# Patient Record
Sex: Male | Born: 1935 | Race: White | Hispanic: No | Marital: Married | Smoking: Never smoker
Health system: Southern US, Community
[De-identification: ages and names within clinical notes are randomized; demographics above are authoritative.]

## PROBLEM LIST (undated history)

## (undated) DIAGNOSIS — F329 Major depressive disorder, single episode, unspecified: Secondary | ICD-10-CM

## (undated) DIAGNOSIS — E1149 Type 2 diabetes mellitus with other diabetic neurological complication: Secondary | ICD-10-CM

## (undated) DIAGNOSIS — R1310 Dysphagia, unspecified: Secondary | ICD-10-CM

## (undated) DIAGNOSIS — G8929 Other chronic pain: Secondary | ICD-10-CM

## (undated) DIAGNOSIS — J449 Chronic obstructive pulmonary disease, unspecified: Secondary | ICD-10-CM

## (undated) DIAGNOSIS — I82409 Acute embolism and thrombosis of unspecified deep veins of unspecified lower extremity: Secondary | ICD-10-CM

## (undated) DIAGNOSIS — E039 Hypothyroidism, unspecified: Secondary | ICD-10-CM

## (undated) DIAGNOSIS — J4489 Other specified chronic obstructive pulmonary disease: Secondary | ICD-10-CM

## (undated) DIAGNOSIS — M549 Dorsalgia, unspecified: Secondary | ICD-10-CM

## (undated) DIAGNOSIS — F0390 Unspecified dementia without behavioral disturbance: Secondary | ICD-10-CM

## (undated) DIAGNOSIS — I509 Heart failure, unspecified: Secondary | ICD-10-CM

## (undated) DIAGNOSIS — N289 Disorder of kidney and ureter, unspecified: Secondary | ICD-10-CM

## (undated) DIAGNOSIS — Z950 Presence of cardiac pacemaker: Secondary | ICD-10-CM

## (undated) DIAGNOSIS — K5900 Constipation, unspecified: Secondary | ICD-10-CM

## (undated) DIAGNOSIS — C189 Malignant neoplasm of colon, unspecified: Secondary | ICD-10-CM

## (undated) DIAGNOSIS — R011 Cardiac murmur, unspecified: Secondary | ICD-10-CM

## (undated) DIAGNOSIS — I482 Chronic atrial fibrillation, unspecified: Secondary | ICD-10-CM

## (undated) DIAGNOSIS — R0609 Other forms of dyspnea: Secondary | ICD-10-CM

## (undated) DIAGNOSIS — C44211 Basal cell carcinoma of skin of unspecified ear and external auricular canal: Secondary | ICD-10-CM

## (undated) DIAGNOSIS — M1990 Unspecified osteoarthritis, unspecified site: Secondary | ICD-10-CM

## (undated) DIAGNOSIS — N390 Urinary tract infection, site not specified: Secondary | ICD-10-CM

## (undated) DIAGNOSIS — R21 Rash and other nonspecific skin eruption: Secondary | ICD-10-CM

## (undated) DIAGNOSIS — I809 Phlebitis and thrombophlebitis of unspecified site: Secondary | ICD-10-CM

## (undated) DIAGNOSIS — I10 Essential (primary) hypertension: Secondary | ICD-10-CM

## (undated) DIAGNOSIS — I639 Cerebral infarction, unspecified: Secondary | ICD-10-CM

## (undated) DIAGNOSIS — G609 Hereditary and idiopathic neuropathy, unspecified: Secondary | ICD-10-CM

## (undated) DIAGNOSIS — R0600 Dyspnea, unspecified: Secondary | ICD-10-CM

## (undated) DIAGNOSIS — H9190 Unspecified hearing loss, unspecified ear: Secondary | ICD-10-CM

## (undated) DIAGNOSIS — Z8719 Personal history of other diseases of the digestive system: Secondary | ICD-10-CM

## (undated) DIAGNOSIS — F32A Depression, unspecified: Secondary | ICD-10-CM

## (undated) DIAGNOSIS — J189 Pneumonia, unspecified organism: Secondary | ICD-10-CM

## (undated) HISTORY — DX: Type 2 diabetes mellitus with other diabetic neurological complication: E11.49

## (undated) HISTORY — PX: INSERT / REPLACE / REMOVE PACEMAKER: SUR710

## (undated) HISTORY — DX: Constipation, unspecified: K59.00

## (undated) HISTORY — PX: COLECTOMY: SHX59

## (undated) HISTORY — PX: CORONARY ARTERY BYPASS GRAFT: SHX141

## (undated) HISTORY — DX: Other specified chronic obstructive pulmonary disease: J44.89

## (undated) HISTORY — PX: HERNIA REPAIR: SHX51

## (undated) HISTORY — PX: CATARACT EXTRACTION W/ INTRAOCULAR LENS  IMPLANT, BILATERAL: SHX1307

## (undated) HISTORY — PX: BASAL CELL CARCINOMA EXCISION: SHX1214

## (undated) HISTORY — DX: Hypothyroidism, unspecified: E03.9

## (undated) HISTORY — PX: TONSILLECTOMY: SUR1361

## (undated) HISTORY — DX: Hereditary and idiopathic neuropathy, unspecified: G60.9

## (undated) HISTORY — DX: Chronic obstructive pulmonary disease, unspecified: J44.9

## (undated) HISTORY — DX: Rash and other nonspecific skin eruption: R21

---

## 1999-04-17 ENCOUNTER — Encounter: Payer: Self-pay | Admitting: Internal Medicine

## 1999-04-17 ENCOUNTER — Inpatient Hospital Stay (HOSPITAL_COMMUNITY): Admission: EM | Admit: 1999-04-17 | Discharge: 1999-04-21 | Payer: Self-pay | Admitting: Emergency Medicine

## 1999-04-19 ENCOUNTER — Encounter: Payer: Self-pay | Admitting: Pediatrics

## 1999-04-21 ENCOUNTER — Inpatient Hospital Stay (HOSPITAL_COMMUNITY)
Admission: RE | Admit: 1999-04-21 | Discharge: 1999-05-12 | Payer: Self-pay | Admitting: Physical Medicine and Rehabilitation

## 1999-04-23 ENCOUNTER — Encounter: Payer: Self-pay | Admitting: Physical Medicine and Rehabilitation

## 1999-04-28 ENCOUNTER — Encounter: Payer: Self-pay | Admitting: Physical Medicine and Rehabilitation

## 1999-05-10 ENCOUNTER — Encounter: Payer: Self-pay | Admitting: Physical Medicine and Rehabilitation

## 1999-05-24 ENCOUNTER — Ambulatory Visit (HOSPITAL_COMMUNITY)
Admission: RE | Admit: 1999-05-24 | Discharge: 1999-05-24 | Payer: Self-pay | Admitting: Physical Medicine and Rehabilitation

## 1999-05-24 ENCOUNTER — Encounter: Payer: Self-pay | Admitting: Physical Medicine and Rehabilitation

## 1999-07-06 ENCOUNTER — Ambulatory Visit (HOSPITAL_COMMUNITY): Admission: RE | Admit: 1999-07-06 | Discharge: 1999-07-06 | Payer: Self-pay | Admitting: Endocrinology

## 1999-07-06 ENCOUNTER — Encounter: Payer: Self-pay | Admitting: Endocrinology

## 1999-07-23 ENCOUNTER — Inpatient Hospital Stay (HOSPITAL_COMMUNITY): Admission: EM | Admit: 1999-07-23 | Discharge: 1999-07-28 | Payer: Self-pay | Admitting: Emergency Medicine

## 1999-07-23 ENCOUNTER — Encounter: Payer: Self-pay | Admitting: Internal Medicine

## 1999-09-12 ENCOUNTER — Inpatient Hospital Stay (HOSPITAL_COMMUNITY): Admission: EM | Admit: 1999-09-12 | Discharge: 1999-09-16 | Payer: Self-pay | Admitting: Emergency Medicine

## 1999-10-10 ENCOUNTER — Ambulatory Visit (HOSPITAL_COMMUNITY): Admission: RE | Admit: 1999-10-10 | Discharge: 1999-10-10 | Payer: Self-pay | Admitting: Gastroenterology

## 2002-04-18 ENCOUNTER — Encounter: Admission: RE | Admit: 2002-04-18 | Discharge: 2002-04-18 | Payer: Self-pay | Admitting: Interventional Cardiology

## 2002-04-18 ENCOUNTER — Encounter: Payer: Self-pay | Admitting: Interventional Cardiology

## 2002-04-21 ENCOUNTER — Ambulatory Visit (HOSPITAL_COMMUNITY): Admission: RE | Admit: 2002-04-21 | Discharge: 2002-04-21 | Payer: Self-pay | Admitting: Interventional Cardiology

## 2002-04-21 ENCOUNTER — Encounter: Payer: Self-pay | Admitting: Interventional Cardiology

## 2002-06-16 ENCOUNTER — Encounter: Payer: Self-pay | Admitting: Cardiothoracic Surgery

## 2002-06-18 ENCOUNTER — Encounter (INDEPENDENT_AMBULATORY_CARE_PROVIDER_SITE_OTHER): Payer: Self-pay | Admitting: *Deleted

## 2002-06-18 ENCOUNTER — Encounter: Payer: Self-pay | Admitting: Cardiothoracic Surgery

## 2002-06-18 ENCOUNTER — Inpatient Hospital Stay (HOSPITAL_COMMUNITY): Admission: RE | Admit: 2002-06-18 | Discharge: 2002-06-26 | Payer: Self-pay | Admitting: Cardiothoracic Surgery

## 2002-06-19 ENCOUNTER — Encounter: Payer: Self-pay | Admitting: Cardiothoracic Surgery

## 2002-06-20 ENCOUNTER — Encounter: Payer: Self-pay | Admitting: Cardiothoracic Surgery

## 2002-06-22 ENCOUNTER — Encounter: Payer: Self-pay | Admitting: Cardiothoracic Surgery

## 2002-06-23 ENCOUNTER — Encounter: Payer: Self-pay | Admitting: Cardiothoracic Surgery

## 2002-06-24 ENCOUNTER — Encounter: Payer: Self-pay | Admitting: Cardiothoracic Surgery

## 2003-05-22 ENCOUNTER — Encounter: Payer: Self-pay | Admitting: *Deleted

## 2003-05-22 ENCOUNTER — Emergency Department (HOSPITAL_COMMUNITY): Admission: EM | Admit: 2003-05-22 | Discharge: 2003-05-23 | Payer: Self-pay | Admitting: Emergency Medicine

## 2003-05-29 HISTORY — PX: MITRAL VALVE ANNULOPLASTY: SHX2038

## 2004-04-04 ENCOUNTER — Encounter: Admission: RE | Admit: 2004-04-04 | Discharge: 2004-04-04 | Payer: Self-pay | Admitting: Endocrinology

## 2004-10-19 ENCOUNTER — Ambulatory Visit (HOSPITAL_COMMUNITY): Admission: RE | Admit: 2004-10-19 | Discharge: 2004-10-19 | Payer: Self-pay | Admitting: Gastroenterology

## 2004-10-19 ENCOUNTER — Encounter (INDEPENDENT_AMBULATORY_CARE_PROVIDER_SITE_OTHER): Payer: Self-pay | Admitting: Specialist

## 2005-05-07 ENCOUNTER — Emergency Department (HOSPITAL_COMMUNITY): Admission: EM | Admit: 2005-05-07 | Discharge: 2005-05-07 | Payer: Self-pay | Admitting: Emergency Medicine

## 2006-01-08 ENCOUNTER — Ambulatory Visit (HOSPITAL_COMMUNITY): Admission: RE | Admit: 2006-01-08 | Discharge: 2006-01-09 | Payer: Self-pay | Admitting: Cardiology

## 2007-02-23 ENCOUNTER — Emergency Department (HOSPITAL_COMMUNITY): Admission: EM | Admit: 2007-02-23 | Discharge: 2007-02-23 | Payer: Self-pay | Admitting: Emergency Medicine

## 2007-02-23 ENCOUNTER — Ambulatory Visit: Payer: Self-pay | Admitting: Vascular Surgery

## 2007-03-05 ENCOUNTER — Encounter: Admission: RE | Admit: 2007-03-05 | Discharge: 2007-03-05 | Payer: Self-pay | Admitting: Internal Medicine

## 2007-03-05 ENCOUNTER — Ambulatory Visit: Payer: Self-pay | Admitting: *Deleted

## 2007-03-06 ENCOUNTER — Ambulatory Visit: Payer: Self-pay | Admitting: Vascular Surgery

## 2007-03-14 ENCOUNTER — Ambulatory Visit (HOSPITAL_COMMUNITY): Admission: RE | Admit: 2007-03-14 | Discharge: 2007-03-15 | Payer: Self-pay | Admitting: Orthopedic Surgery

## 2007-03-14 ENCOUNTER — Encounter (INDEPENDENT_AMBULATORY_CARE_PROVIDER_SITE_OTHER): Payer: Self-pay | Admitting: Orthopedic Surgery

## 2007-10-05 ENCOUNTER — Emergency Department (HOSPITAL_COMMUNITY): Admission: EM | Admit: 2007-10-05 | Discharge: 2007-10-05 | Payer: Self-pay | Admitting: Emergency Medicine

## 2008-05-26 ENCOUNTER — Emergency Department (HOSPITAL_COMMUNITY): Admission: EM | Admit: 2008-05-26 | Discharge: 2008-05-26 | Payer: Self-pay | Admitting: Emergency Medicine

## 2008-06-23 ENCOUNTER — Inpatient Hospital Stay (HOSPITAL_COMMUNITY): Admission: EM | Admit: 2008-06-23 | Discharge: 2008-06-26 | Payer: Self-pay | Admitting: Emergency Medicine

## 2008-08-24 ENCOUNTER — Ambulatory Visit (HOSPITAL_COMMUNITY): Admission: RE | Admit: 2008-08-24 | Discharge: 2008-08-24 | Payer: Self-pay | Admitting: *Deleted

## 2008-12-22 ENCOUNTER — Emergency Department (HOSPITAL_COMMUNITY): Admission: EM | Admit: 2008-12-22 | Discharge: 2008-12-22 | Payer: Self-pay | Admitting: Emergency Medicine

## 2009-09-06 ENCOUNTER — Ambulatory Visit: Payer: Self-pay | Admitting: Surgery

## 2009-09-06 ENCOUNTER — Encounter (INDEPENDENT_AMBULATORY_CARE_PROVIDER_SITE_OTHER): Payer: Self-pay | Admitting: Internal Medicine

## 2009-09-06 ENCOUNTER — Inpatient Hospital Stay (HOSPITAL_COMMUNITY): Admission: EM | Admit: 2009-09-06 | Discharge: 2009-09-10 | Payer: Self-pay | Admitting: Emergency Medicine

## 2009-09-07 ENCOUNTER — Encounter (INDEPENDENT_AMBULATORY_CARE_PROVIDER_SITE_OTHER): Payer: Self-pay | Admitting: Internal Medicine

## 2009-11-23 ENCOUNTER — Emergency Department (HOSPITAL_COMMUNITY): Admission: EM | Admit: 2009-11-23 | Discharge: 2009-11-24 | Payer: Self-pay | Admitting: Emergency Medicine

## 2009-12-09 ENCOUNTER — Inpatient Hospital Stay (HOSPITAL_COMMUNITY): Admission: EM | Admit: 2009-12-09 | Discharge: 2009-12-21 | Payer: Self-pay | Admitting: Emergency Medicine

## 2009-12-15 ENCOUNTER — Encounter (INDEPENDENT_AMBULATORY_CARE_PROVIDER_SITE_OTHER): Payer: Self-pay | Admitting: Family Medicine

## 2009-12-15 ENCOUNTER — Ambulatory Visit: Payer: Self-pay | Admitting: Vascular Surgery

## 2009-12-16 ENCOUNTER — Encounter (INDEPENDENT_AMBULATORY_CARE_PROVIDER_SITE_OTHER): Payer: Self-pay | Admitting: Family Medicine

## 2010-05-25 ENCOUNTER — Inpatient Hospital Stay (HOSPITAL_COMMUNITY): Admission: EM | Admit: 2010-05-25 | Discharge: 2010-06-16 | Payer: Self-pay | Admitting: Emergency Medicine

## 2010-06-13 ENCOUNTER — Encounter (INDEPENDENT_AMBULATORY_CARE_PROVIDER_SITE_OTHER): Payer: Self-pay | Admitting: Internal Medicine

## 2010-11-09 LAB — MAGNESIUM
Magnesium: 1.6 mg/dL (ref 1.5–2.5)
Magnesium: 1.7 mg/dL (ref 1.5–2.5)
Magnesium: 1.7 mg/dL (ref 1.5–2.5)
Magnesium: 1.7 mg/dL (ref 1.5–2.5)
Magnesium: 1.7 mg/dL (ref 1.5–2.5)

## 2010-11-09 LAB — BASIC METABOLIC PANEL
BUN: 14 mg/dL (ref 6–23)
BUN: 14 mg/dL (ref 6–23)
CO2: 29 mEq/L (ref 19–32)
Calcium: 8.3 mg/dL — ABNORMAL LOW (ref 8.4–10.5)
Calcium: 8.5 mg/dL (ref 8.4–10.5)
Calcium: 8.5 mg/dL (ref 8.4–10.5)
Calcium: 8.5 mg/dL (ref 8.4–10.5)
Calcium: 8.5 mg/dL (ref 8.4–10.5)
Chloride: 98 mEq/L (ref 96–112)
Creatinine, Ser: 1.24 mg/dL (ref 0.4–1.5)
GFR calc Af Amer: >60 mL/min (ref >60)
GFR calc non Af Amer: 53 mL/min — ABNORMAL LOW (ref >60)
GFR calc non Af Amer: 54 mL/min — ABNORMAL LOW (ref >60)
GFR calc non Af Amer: 54 mL/min — ABNORMAL LOW (ref >60)
GFR calc non Af Amer: 55 mL/min — ABNORMAL LOW (ref >60)
GFR calc non Af Amer: >60 mL/min (ref >60)
Glucose, Bld: 103 mg/dL — ABNORMAL HIGH (ref 70–99)
Glucose, Bld: 121 mg/dL — ABNORMAL HIGH (ref 70–99)
Glucose, Bld: 121 mg/dL — ABNORMAL HIGH (ref 70–99)
Glucose, Bld: 184 mg/dL — ABNORMAL HIGH (ref 70–99)
Glucose, Bld: 93 mg/dL (ref 70–99)
Potassium: 4.1 mEq/L (ref 3.5–5.1)
Potassium: 4.3 mEq/L (ref 3.5–5.1)
Potassium: 4.5 mEq/L (ref 3.5–5.1)
Sodium: 134 mEq/L — ABNORMAL LOW (ref 135–145)
Sodium: 134 mEq/L — ABNORMAL LOW (ref 135–145)
Sodium: 134 mEq/L — ABNORMAL LOW (ref 135–145)
Sodium: 137 mEq/L (ref 135–145)

## 2010-11-09 LAB — GLUCOSE, CAPILLARY
Glucose-Capillary: 100 mg/dL — ABNORMAL HIGH (ref 70–99)
Glucose-Capillary: 111 mg/dL — ABNORMAL HIGH (ref 70–99)
Glucose-Capillary: 111 mg/dL — ABNORMAL HIGH (ref 70–99)
Glucose-Capillary: 113 mg/dL — ABNORMAL HIGH (ref 70–99)
Glucose-Capillary: 114 mg/dL — ABNORMAL HIGH (ref 70–99)
Glucose-Capillary: 115 mg/dL — ABNORMAL HIGH (ref 70–99)
Glucose-Capillary: 118 mg/dL — ABNORMAL HIGH (ref 70–99)
Glucose-Capillary: 122 mg/dL — ABNORMAL HIGH (ref 70–99)
Glucose-Capillary: 127 mg/dL — ABNORMAL HIGH (ref 70–99)
Glucose-Capillary: 132 mg/dL — ABNORMAL HIGH (ref 70–99)
Glucose-Capillary: 137 mg/dL — ABNORMAL HIGH (ref 70–99)
Glucose-Capillary: 139 mg/dL — ABNORMAL HIGH (ref 70–99)
Glucose-Capillary: 150 mg/dL — ABNORMAL HIGH (ref 70–99)
Glucose-Capillary: 94 mg/dL (ref 70–99)
Glucose-Capillary: 96 mg/dL (ref 70–99)

## 2010-11-09 LAB — CBC
HCT: 31.3 % — ABNORMAL LOW (ref 39.0–52.0)
HCT: 31.3 % — ABNORMAL LOW (ref 39.0–52.0)
HCT: 31.9 % — ABNORMAL LOW (ref 39.0–52.0)
HCT: 34.3 % — ABNORMAL LOW (ref 39.0–52.0)
Hemoglobin: 10.4 g/dL — ABNORMAL LOW (ref 13.0–17.0)
Hemoglobin: 10.5 g/dL — ABNORMAL LOW (ref 13.0–17.0)
Hemoglobin: 10.7 g/dL — ABNORMAL LOW (ref 13.0–17.0)
Hemoglobin: 11.2 g/dL — ABNORMAL LOW (ref 13.0–17.0)
MCH: 29.6 pg (ref 26.0–34.0)
MCHC: 33.2 g/dL (ref 30.0–36.0)
MCHC: 33.2 g/dL (ref 30.0–36.0)
MCHC: 33.3 g/dL (ref 30.0–36.0)
MCHC: 33.5 g/dL (ref 30.0–36.0)
MCHC: 33.5 g/dL (ref 30.0–36.0)
MCV: 88.7 fL (ref 78.0–100.0)
Platelets: 226 10*3/uL (ref 150–400)
RBC: 3.53 MIL/uL — ABNORMAL LOW (ref 4.22–5.81)
RDW: 16 % — ABNORMAL HIGH (ref 11.5–15.5)
RDW: 16.1 % — ABNORMAL HIGH (ref 11.5–15.5)
RDW: 16.1 % — ABNORMAL HIGH (ref 11.5–15.5)
RDW: 16.4 % — ABNORMAL HIGH (ref 11.5–15.5)
WBC: 5.2 10*3/uL (ref 4.0–10.5)
WBC: 5.6 10*3/uL (ref 4.0–10.5)
WBC: 5.9 10*3/uL (ref 4.0–10.5)

## 2010-11-09 LAB — URINALYSIS, ROUTINE W REFLEX MICROSCOPIC
Bilirubin Urine: NEGATIVE
Hgb urine dipstick: NEGATIVE
Nitrite: NEGATIVE
Protein, ur: 30 mg/dL — AB
Specific Gravity, Urine: 1.017 (ref 1.005–1.030)
Urobilinogen, UA: 0.2 mg/dL (ref 0.0–1.0)

## 2010-11-09 LAB — URINE CULTURE
Colony Count: NO GROWTH
Culture  Setup Time: 201110161728

## 2010-11-09 LAB — HEMOGLOBIN AND HEMATOCRIT, BLOOD: HCT: 32.8 % — ABNORMAL LOW (ref 39.0–52.0)

## 2010-11-09 LAB — BRAIN NATRIURETIC PEPTIDE: Pro B Natriuretic peptide (BNP): 300 pg/mL — ABNORMAL HIGH (ref 0.0–100.0)

## 2010-11-10 LAB — GLUCOSE, CAPILLARY
Glucose-Capillary: 103 mg/dL — ABNORMAL HIGH (ref 70–99)
Glucose-Capillary: 110 mg/dL — ABNORMAL HIGH (ref 70–99)
Glucose-Capillary: 113 mg/dL — ABNORMAL HIGH (ref 70–99)
Glucose-Capillary: 117 mg/dL — ABNORMAL HIGH (ref 70–99)
Glucose-Capillary: 118 mg/dL — ABNORMAL HIGH (ref 70–99)
Glucose-Capillary: 119 mg/dL — ABNORMAL HIGH (ref 70–99)
Glucose-Capillary: 119 mg/dL — ABNORMAL HIGH (ref 70–99)
Glucose-Capillary: 120 mg/dL — ABNORMAL HIGH (ref 70–99)
Glucose-Capillary: 120 mg/dL — ABNORMAL HIGH (ref 70–99)
Glucose-Capillary: 121 mg/dL — ABNORMAL HIGH (ref 70–99)
Glucose-Capillary: 124 mg/dL — ABNORMAL HIGH (ref 70–99)
Glucose-Capillary: 129 mg/dL — ABNORMAL HIGH (ref 70–99)
Glucose-Capillary: 133 mg/dL — ABNORMAL HIGH (ref 70–99)
Glucose-Capillary: 133 mg/dL — ABNORMAL HIGH (ref 70–99)
Glucose-Capillary: 134 mg/dL — ABNORMAL HIGH (ref 70–99)
Glucose-Capillary: 135 mg/dL — ABNORMAL HIGH (ref 70–99)
Glucose-Capillary: 136 mg/dL — ABNORMAL HIGH (ref 70–99)
Glucose-Capillary: 136 mg/dL — ABNORMAL HIGH (ref 70–99)
Glucose-Capillary: 137 mg/dL — ABNORMAL HIGH (ref 70–99)
Glucose-Capillary: 139 mg/dL — ABNORMAL HIGH (ref 70–99)
Glucose-Capillary: 139 mg/dL — ABNORMAL HIGH (ref 70–99)
Glucose-Capillary: 139 mg/dL — ABNORMAL HIGH (ref 70–99)
Glucose-Capillary: 141 mg/dL — ABNORMAL HIGH (ref 70–99)
Glucose-Capillary: 144 mg/dL — ABNORMAL HIGH (ref 70–99)
Glucose-Capillary: 154 mg/dL — ABNORMAL HIGH (ref 70–99)
Glucose-Capillary: 155 mg/dL — ABNORMAL HIGH (ref 70–99)
Glucose-Capillary: 155 mg/dL — ABNORMAL HIGH (ref 70–99)
Glucose-Capillary: 156 mg/dL — ABNORMAL HIGH (ref 70–99)
Glucose-Capillary: 160 mg/dL — ABNORMAL HIGH (ref 70–99)
Glucose-Capillary: 184 mg/dL — ABNORMAL HIGH (ref 70–99)
Glucose-Capillary: 97 mg/dL (ref 70–99)

## 2010-11-10 LAB — CBC
HCT: 33.1 % — ABNORMAL LOW (ref 39.0–52.0)
HCT: 34.5 % — ABNORMAL LOW (ref 39.0–52.0)
HCT: 35.4 % — ABNORMAL LOW (ref 39.0–52.0)
HCT: 35.5 % — ABNORMAL LOW (ref 39.0–52.0)
HCT: 39.1 % (ref 39.0–52.0)
Hemoglobin: 11 g/dL — ABNORMAL LOW (ref 13.0–17.0)
Hemoglobin: 11.6 g/dL — ABNORMAL LOW (ref 13.0–17.0)
Hemoglobin: 11.9 g/dL — ABNORMAL LOW (ref 13.0–17.0)
Hemoglobin: 12.4 g/dL — ABNORMAL LOW (ref 13.0–17.0)
Hemoglobin: 12.4 g/dL — ABNORMAL LOW (ref 13.0–17.0)
Hemoglobin: 13.1 g/dL (ref 13.0–17.0)
MCH: 29.3 pg (ref 26.0–34.0)
MCH: 29.4 pg (ref 26.0–34.0)
MCH: 29.4 pg (ref 26.0–34.0)
MCH: 29.5 pg (ref 26.0–34.0)
MCH: 29.7 pg (ref 26.0–34.0)
MCHC: 33.6 g/dL (ref 30.0–36.0)
MCHC: 33.6 g/dL (ref 30.0–36.0)
MCHC: 33.8 g/dL (ref 30.0–36.0)
MCHC: 34.3 g/dL (ref 30.0–36.0)
MCV: 87.3 fL (ref 78.0–100.0)
MCV: 87.6 fL (ref 78.0–100.0)
MCV: 87.8 fL (ref 78.0–100.0)
MCV: 88 fL (ref 78.0–100.0)
MCV: 88.7 fL (ref 78.0–100.0)
Platelets: 173 10*3/uL (ref 150–400)
Platelets: 294 10*3/uL (ref 150–400)
RBC: 3.72 MIL/uL — ABNORMAL LOW (ref 4.22–5.81)
RBC: 4.22 MIL/uL (ref 4.22–5.81)
RBC: 4.22 MIL/uL (ref 4.22–5.81)
RBC: 4.42 MIL/uL (ref 4.22–5.81)
RDW: 15.3 % (ref 11.5–15.5)
RDW: 15.5 % (ref 11.5–15.5)
RDW: 15.9 % — ABNORMAL HIGH (ref 11.5–15.5)
WBC: 11.5 10*3/uL — ABNORMAL HIGH (ref 4.0–10.5)
WBC: 15 10*3/uL — ABNORMAL HIGH (ref 4.0–10.5)

## 2010-11-10 LAB — BASIC METABOLIC PANEL
BUN: 13 mg/dL (ref 6–23)
BUN: 13 mg/dL (ref 6–23)
BUN: 28 mg/dL — ABNORMAL HIGH (ref 6–23)
BUN: 33 mg/dL — ABNORMAL HIGH (ref 6–23)
CO2: 18 mEq/L — ABNORMAL LOW (ref 19–32)
CO2: 20 mEq/L (ref 19–32)
CO2: 24 mEq/L (ref 19–32)
CO2: 26 mEq/L (ref 19–32)
CO2: 27 mEq/L (ref 19–32)
CO2: 28 mEq/L (ref 19–32)
CO2: 30 mEq/L (ref 19–32)
CO2: 30 mEq/L (ref 19–32)
Calcium: 7.7 mg/dL — ABNORMAL LOW (ref 8.4–10.5)
Calcium: 7.8 mg/dL — ABNORMAL LOW (ref 8.4–10.5)
Calcium: 7.8 mg/dL — ABNORMAL LOW (ref 8.4–10.5)
Calcium: 8.1 mg/dL — ABNORMAL LOW (ref 8.4–10.5)
Chloride: 100 mEq/L (ref 96–112)
Chloride: 100 mEq/L (ref 96–112)
Chloride: 107 mEq/L (ref 96–112)
Chloride: 109 mEq/L (ref 96–112)
Chloride: 113 mEq/L — ABNORMAL HIGH (ref 96–112)
Chloride: 113 mEq/L — ABNORMAL HIGH (ref 96–112)
Chloride: 115 mEq/L — ABNORMAL HIGH (ref 96–112)
Chloride: 115 mEq/L — ABNORMAL HIGH (ref 96–112)
Chloride: 99 mEq/L (ref 96–112)
Creatinine, Ser: 1.08 mg/dL (ref 0.4–1.5)
Creatinine, Ser: 1.31 mg/dL (ref 0.4–1.5)
Creatinine, Ser: 1.37 mg/dL (ref 0.4–1.5)
Creatinine, Ser: 1.55 mg/dL — ABNORMAL HIGH (ref 0.4–1.5)
Creatinine, Ser: 1.83 mg/dL — ABNORMAL HIGH (ref 0.4–1.5)
GFR calc Af Amer: 34 mL/min — ABNORMAL LOW (ref >60)
GFR calc Af Amer: 51 mL/min — ABNORMAL LOW (ref >60)
GFR calc Af Amer: 59 mL/min — ABNORMAL LOW (ref >60)
GFR calc Af Amer: >60 mL/min (ref >60)
GFR calc Af Amer: >60 mL/min (ref >60)
GFR calc non Af Amer: 44 mL/min — ABNORMAL LOW (ref >60)
GFR calc non Af Amer: 51 mL/min — ABNORMAL LOW (ref >60)
Glucose, Bld: 132 mg/dL — ABNORMAL HIGH (ref 70–99)
Glucose, Bld: 148 mg/dL — ABNORMAL HIGH (ref 70–99)
Glucose, Bld: 166 mg/dL — ABNORMAL HIGH (ref 70–99)
Potassium: 2.9 mEq/L — ABNORMAL LOW (ref 3.5–5.1)
Potassium: 3.2 mEq/L — ABNORMAL LOW (ref 3.5–5.1)
Potassium: 3.8 mEq/L (ref 3.5–5.1)
Potassium: 3.9 mEq/L (ref 3.5–5.1)
Potassium: 4.1 mEq/L (ref 3.5–5.1)
Potassium: 4.2 mEq/L (ref 3.5–5.1)
Sodium: 135 mEq/L (ref 135–145)
Sodium: 135 mEq/L (ref 135–145)
Sodium: 137 mEq/L (ref 135–145)
Sodium: 139 mEq/L (ref 135–145)
Sodium: 143 mEq/L (ref 135–145)

## 2010-11-10 LAB — CULTURE, BLOOD (ROUTINE X 2)
Culture  Setup Time: 201109280133
Culture: NO GROWTH
Culture: NO GROWTH

## 2010-11-10 LAB — CLOSTRIDIUM DIFFICILE EIA: C difficile Toxins A+B, EIA: NEGATIVE

## 2010-11-10 LAB — URINE CULTURE
Colony Count: >=100000
Colony Count: NO GROWTH
Culture  Setup Time: 201109280131
Culture  Setup Time: 201110082048
Culture: NO GROWTH

## 2010-11-10 LAB — DIFFERENTIAL
Basophils Relative: 0 % (ref 0–1)
Basophils Relative: 0 % (ref 0–1)
Eosinophils Absolute: 0 10*3/uL (ref 0.0–0.7)
Eosinophils Absolute: 0 10*3/uL (ref 0.0–0.7)
Eosinophils Absolute: 0.2 10*3/uL (ref 0.0–0.7)
Eosinophils Relative: 0 % (ref 0–5)
Eosinophils Relative: 0 % (ref 0–5)
Lymphs Abs: 1.3 10*3/uL (ref 0.7–4.0)
Lymphs Abs: 1.4 10*3/uL (ref 0.7–4.0)
Monocytes Absolute: 0.8 10*3/uL (ref 0.1–1.0)
Monocytes Relative: 4 % (ref 3–12)
Monocytes Relative: 7 % (ref 3–12)
Monocytes Relative: 8 % (ref 3–12)
Neutrophils Relative %: 75 % (ref 43–77)
Neutrophils Relative %: 86 % — ABNORMAL HIGH (ref 43–77)

## 2010-11-10 LAB — COMPREHENSIVE METABOLIC PANEL
ALT: 11 U/L (ref 0–53)
ALT: 13 U/L (ref 0–53)
AST: 26 U/L (ref 0–37)
AST: 29 U/L (ref 0–37)
Albumin: 2.7 g/dL — ABNORMAL LOW (ref 3.5–5.2)
Alkaline Phosphatase: 47 U/L (ref 39–117)
Alkaline Phosphatase: 77 U/L (ref 39–117)
CO2: 22 mEq/L (ref 19–32)
Calcium: 9.2 mg/dL (ref 8.4–10.5)
Chloride: 109 mEq/L (ref 96–112)
GFR calc Af Amer: 35 mL/min — ABNORMAL LOW (ref >60)
GFR calc Af Amer: 38 mL/min — ABNORMAL LOW (ref >60)
GFR calc non Af Amer: 32 mL/min — ABNORMAL LOW (ref >60)
Glucose, Bld: 122 mg/dL — ABNORMAL HIGH (ref 70–99)
Glucose, Bld: 159 mg/dL — ABNORMAL HIGH (ref 70–99)
Potassium: 3.7 mEq/L (ref 3.5–5.1)
Potassium: 4.2 mEq/L (ref 3.5–5.1)
Sodium: 140 mEq/L (ref 135–145)
Sodium: 142 mEq/L (ref 135–145)
Total Protein: 5.2 g/dL — ABNORMAL LOW (ref 6.0–8.3)

## 2010-11-10 LAB — URINALYSIS, ROUTINE W REFLEX MICROSCOPIC
Bilirubin Urine: NEGATIVE
Bilirubin Urine: NEGATIVE
Glucose, UA: NEGATIVE mg/dL
Glucose, UA: NEGATIVE mg/dL
Ketones, ur: NEGATIVE mg/dL
Leukocytes, UA: NEGATIVE
Nitrite: POSITIVE — AB
Protein, ur: 30 mg/dL — AB
Specific Gravity, Urine: 1.023 (ref 1.005–1.030)
pH: 5.5 (ref 5.0–8.0)
pH: 5.5 (ref 5.0–8.0)

## 2010-11-10 LAB — STOOL CULTURE

## 2010-11-10 LAB — MAGNESIUM: Magnesium: 1.5 mg/dL (ref 1.5–2.5)

## 2010-11-10 LAB — URINE MICROSCOPIC-ADD ON

## 2010-11-10 LAB — PROCALCITONIN: Procalcitonin: 0.51 ng/mL

## 2010-11-10 LAB — LACTIC ACID, PLASMA: Lactic Acid, Venous: 1.8 mmol/L (ref 0.5–2.2)

## 2010-11-10 LAB — HEMOCCULT GUIAC POC 1CARD (OFFICE): Fecal Occult Bld: POSITIVE

## 2010-11-13 LAB — COMPREHENSIVE METABOLIC PANEL WITH GFR
ALT: 13 U/L (ref 0–53)
AST: 21 U/L (ref 0–37)
Albumin: 3.5 g/dL (ref 3.5–5.2)
Alkaline Phosphatase: 74 U/L (ref 39–117)
BUN: 24 mg/dL — ABNORMAL HIGH (ref 6–23)
CO2: 28 meq/L (ref 19–32)
Calcium: 9.2 mg/dL (ref 8.4–10.5)
Chloride: 103 meq/L (ref 96–112)
Creatinine, Ser: 1.49 mg/dL (ref 0.4–1.5)
GFR calc non Af Amer: 44 mL/min — ABNORMAL LOW
Glucose, Bld: 99 mg/dL (ref 70–99)
Potassium: 4.4 meq/L (ref 3.5–5.1)
Sodium: 140 meq/L (ref 135–145)
Total Bilirubin: 0.6 mg/dL (ref 0.3–1.2)
Total Protein: 6.9 g/dL (ref 6.0–8.3)

## 2010-11-13 LAB — BRAIN NATRIURETIC PEPTIDE
Pro B Natriuretic peptide (BNP): 225 pg/mL — ABNORMAL HIGH (ref 0.0–100.0)
Pro B Natriuretic peptide (BNP): 472 pg/mL — ABNORMAL HIGH (ref 0.0–100.0)

## 2010-11-13 LAB — BASIC METABOLIC PANEL WITH GFR
BUN: 33 mg/dL — ABNORMAL HIGH (ref 6–23)
CO2: 28 meq/L (ref 19–32)
Calcium: 8.5 mg/dL (ref 8.4–10.5)
Chloride: 100 meq/L (ref 96–112)
Creatinine, Ser: 1.75 mg/dL — ABNORMAL HIGH (ref 0.4–1.5)
GFR calc non Af Amer: 37 mL/min — ABNORMAL LOW
Glucose, Bld: 113 mg/dL — ABNORMAL HIGH (ref 70–99)
Potassium: 4.1 meq/L (ref 3.5–5.1)
Sodium: 136 meq/L (ref 135–145)

## 2010-11-13 LAB — POCT I-STAT 3, ART BLOOD GAS (G3+)
Bicarbonate: 22.6 mEq/L (ref 20.0–24.0)
O2 Saturation: 98 %
TCO2: 24 mmol/L (ref 0–100)
pCO2 arterial: 34.2 mmHg — ABNORMAL LOW (ref 35.0–45.0)
pH, Arterial: 7.428 (ref 7.350–7.450)
pO2, Arterial: 102 mmHg — ABNORMAL HIGH (ref 80.0–100.0)

## 2010-11-13 LAB — CBC
HCT: 31 % — ABNORMAL LOW (ref 39.0–52.0)
HCT: 33.7 % — ABNORMAL LOW (ref 39.0–52.0)
HCT: 33.8 % — ABNORMAL LOW (ref 39.0–52.0)
HCT: 35.3 % — ABNORMAL LOW (ref 39.0–52.0)
Hemoglobin: 11.4 g/dL — ABNORMAL LOW (ref 13.0–17.0)
Hemoglobin: 11.5 g/dL — ABNORMAL LOW (ref 13.0–17.0)
Hemoglobin: 11.8 g/dL — ABNORMAL LOW (ref 13.0–17.0)
MCHC: 33.5 g/dL (ref 30.0–36.0)
MCHC: 33.8 g/dL (ref 30.0–36.0)
MCHC: 34 g/dL (ref 30.0–36.0)
MCHC: 34.2 g/dL (ref 30.0–36.0)
MCV: 90.4 fL (ref 78.0–100.0)
MCV: 90.7 fL (ref 78.0–100.0)
MCV: 91.5 fL (ref 78.0–100.0)
MCV: 91.7 fL (ref 78.0–100.0)
Platelets: 171 K/uL (ref 150–400)
Platelets: 178 K/uL (ref 150–400)
Platelets: 183 K/uL (ref 150–400)
RBC: 3.42 MIL/uL — ABNORMAL LOW (ref 4.22–5.81)
RBC: 3.68 MIL/uL — ABNORMAL LOW (ref 4.22–5.81)
RBC: 3.73 MIL/uL — ABNORMAL LOW (ref 4.22–5.81)
RBC: 3.86 MIL/uL — ABNORMAL LOW (ref 4.22–5.81)
RDW: 14.5 % (ref 11.5–15.5)
RDW: 14.7 % (ref 11.5–15.5)
RDW: 14.8 % (ref 11.5–15.5)
WBC: 4.5 10*3/uL (ref 4.0–10.5)
WBC: 5.2 K/uL (ref 4.0–10.5)
WBC: 5.3 K/uL (ref 4.0–10.5)
WBC: 5.4 K/uL (ref 4.0–10.5)

## 2010-11-13 LAB — DIFFERENTIAL
Basophils Absolute: 0 K/uL (ref 0.0–0.1)
Basophils Relative: 0 % (ref 0–1)
Basophils Relative: 1 % (ref 0–1)
Eosinophils Absolute: 0.2 10*3/uL (ref 0.0–0.7)
Eosinophils Absolute: 0.2 K/uL (ref 0.0–0.7)
Eosinophils Relative: 4 % (ref 0–5)
Eosinophils Relative: 4 % (ref 0–5)
Lymphocytes Relative: 30 % (ref 12–46)
Lymphs Abs: 1.1 10*3/uL (ref 0.7–4.0)
Lymphs Abs: 1.5 K/uL (ref 0.7–4.0)
Monocytes Absolute: 0.7 K/uL (ref 0.1–1.0)
Monocytes Relative: 10 % (ref 3–12)
Monocytes Relative: 14 % — ABNORMAL HIGH (ref 3–12)
Neutro Abs: 2.7 K/uL (ref 1.7–7.7)
Neutrophils Relative %: 52 % (ref 43–77)
Neutrophils Relative %: 60 % (ref 43–77)

## 2010-11-13 LAB — POCT I-STAT, CHEM 8
Calcium, Ion: 1.19 mmol/L (ref 1.12–1.32)
Creatinine, Ser: 1.3 mg/dL (ref 0.4–1.5)
Glucose, Bld: 104 mg/dL — ABNORMAL HIGH (ref 70–99)
HCT: 32 % — ABNORMAL LOW (ref 39.0–52.0)
Hemoglobin: 10.9 g/dL — ABNORMAL LOW (ref 13.0–17.0)
Potassium: 4.6 mEq/L (ref 3.5–5.1)
TCO2: 22 mmol/L (ref 0–100)

## 2010-11-13 LAB — BASIC METABOLIC PANEL
BUN: 27 mg/dL — ABNORMAL HIGH (ref 6–23)
BUN: 29 mg/dL — ABNORMAL HIGH (ref 6–23)
CO2: 30 mEq/L (ref 19–32)
Chloride: 101 mEq/L (ref 96–112)
GFR calc non Af Amer: 38 mL/min — ABNORMAL LOW (ref >60)
Glucose, Bld: 108 mg/dL — ABNORMAL HIGH (ref 70–99)
Glucose, Bld: 108 mg/dL — ABNORMAL HIGH (ref 70–99)
Potassium: 4.3 mEq/L (ref 3.5–5.1)
Potassium: 4.5 mEq/L (ref 3.5–5.1)

## 2010-11-13 LAB — URINALYSIS, ROUTINE W REFLEX MICROSCOPIC
Glucose, UA: NEGATIVE mg/dL
Hgb urine dipstick: NEGATIVE
Protein, ur: NEGATIVE mg/dL
Specific Gravity, Urine: 1.014 (ref 1.005–1.030)

## 2010-11-13 LAB — POCT CARDIAC MARKERS: CKMB, poc: 1 ng/mL (ref 1.0–8.0)

## 2010-11-13 LAB — PROTIME-INR
INR: 1.3 (ref 0.00–1.49)
Prothrombin Time: 15.2 seconds (ref 11.6–15.2)
Prothrombin Time: 16.1 s — ABNORMAL HIGH (ref 11.6–15.2)

## 2010-11-13 LAB — GLUCOSE, CAPILLARY: Glucose-Capillary: 96 mg/dL (ref 70–99)

## 2010-11-13 LAB — CARDIAC PANEL(CRET KIN+CKTOT+MB+TROPI)
CK, MB: 3 ng/mL (ref 0.3–4.0)
Relative Index: INVALID (ref 0.0–2.5)
Troponin I: 0.02 ng/mL (ref 0.00–0.06)

## 2010-11-13 LAB — APTT: aPTT: 32 s (ref 24–37)

## 2010-11-13 LAB — URINE CULTURE

## 2010-11-13 LAB — TROPONIN I

## 2010-11-13 LAB — CK TOTAL AND CKMB (NOT AT ARMC): CK, MB: 2.3 ng/mL (ref 0.3–4.0)

## 2010-11-15 LAB — CBC
HCT: 29.3 % — ABNORMAL LOW (ref 39.0–52.0)
HCT: 29.6 % — ABNORMAL LOW (ref 39.0–52.0)
HCT: 30.5 % — ABNORMAL LOW (ref 39.0–52.0)
HCT: 30.6 % — ABNORMAL LOW (ref 39.0–52.0)
HCT: 32 % — ABNORMAL LOW (ref 39.0–52.0)
HCT: 33.4 % — ABNORMAL LOW (ref 39.0–52.0)
Hemoglobin: 10.9 g/dL — ABNORMAL LOW (ref 13.0–17.0)
Hemoglobin: 9.4 g/dL — ABNORMAL LOW (ref 13.0–17.0)
Hemoglobin: 9.5 g/dL — ABNORMAL LOW (ref 13.0–17.0)
Hemoglobin: 9.9 g/dL — ABNORMAL LOW (ref 13.0–17.0)
MCHC: 33.2 g/dL (ref 30.0–36.0)
MCHC: 33.2 g/dL (ref 30.0–36.0)
MCHC: 33.7 g/dL (ref 30.0–36.0)
MCHC: 33.8 g/dL (ref 30.0–36.0)
MCHC: 34.2 g/dL (ref 30.0–36.0)
MCHC: 34.5 g/dL (ref 30.0–36.0)
MCV: 89.5 fL (ref 78.0–100.0)
MCV: 89.8 fL (ref 78.0–100.0)
MCV: 89.9 fL (ref 78.0–100.0)
MCV: 89.9 fL (ref 78.0–100.0)
MCV: 90.2 fL (ref 78.0–100.0)
MCV: 90.3 fL (ref 78.0–100.0)
MCV: 90.3 fL (ref 78.0–100.0)
MCV: 93.6 fL (ref 78.0–100.0)
Platelets: 144 10*3/uL — ABNORMAL LOW (ref 150–400)
Platelets: 147 10*3/uL — ABNORMAL LOW (ref 150–400)
Platelets: 152 10*3/uL (ref 150–400)
Platelets: 162 10*3/uL (ref 150–400)
Platelets: 165 10*3/uL (ref 150–400)
Platelets: 181 10*3/uL (ref 150–400)
Platelets: 210 10*3/uL (ref 150–400)
Platelets: 268 10*3/uL (ref 150–400)
RBC: 2.83 MIL/uL — ABNORMAL LOW (ref 4.22–5.81)
RBC: 3.11 MIL/uL — ABNORMAL LOW (ref 4.22–5.81)
RBC: 3.56 MIL/uL — ABNORMAL LOW (ref 4.22–5.81)
RDW: 14.6 % (ref 11.5–15.5)
RDW: 14.9 % (ref 11.5–15.5)
RDW: 14.9 % (ref 11.5–15.5)
RDW: 15 % (ref 11.5–15.5)
RDW: 15 % (ref 11.5–15.5)
RDW: 15 % (ref 11.5–15.5)
RDW: 15.2 % (ref 11.5–15.5)
RDW: 15.5 % (ref 11.5–15.5)
WBC: 11 10*3/uL — ABNORMAL HIGH (ref 4.0–10.5)
WBC: 7 10*3/uL (ref 4.0–10.5)
WBC: 8.4 10*3/uL (ref 4.0–10.5)
WBC: 8.8 10*3/uL (ref 4.0–10.5)

## 2010-11-15 LAB — COMPREHENSIVE METABOLIC PANEL
BUN: 52 mg/dL — ABNORMAL HIGH (ref 6–23)
CO2: 24 mEq/L (ref 19–32)
Calcium: 7.8 mg/dL — ABNORMAL LOW (ref 8.4–10.5)
Creatinine, Ser: 1.63 mg/dL — ABNORMAL HIGH (ref 0.4–1.5)
GFR calc non Af Amer: 40 mL/min — ABNORMAL LOW (ref >60)
Glucose, Bld: 170 mg/dL — ABNORMAL HIGH (ref 70–99)
Sodium: 133 mEq/L — ABNORMAL LOW (ref 135–145)
Total Protein: 5.2 g/dL — ABNORMAL LOW (ref 6.0–8.3)

## 2010-11-15 LAB — BLOOD GAS, ARTERIAL
Drawn by: 277331
O2 Content: 2 L/min
Patient temperature: 98.7
TCO2: 20.6 mmol/L (ref 0–100)
pCO2 arterial: 33.3 mmHg — ABNORMAL LOW (ref 35.0–45.0)
pH, Arterial: 7.387 (ref 7.350–7.450)

## 2010-11-15 LAB — BASIC METABOLIC PANEL
BUN: 26 mg/dL — ABNORMAL HIGH (ref 6–23)
BUN: 28 mg/dL — ABNORMAL HIGH (ref 6–23)
BUN: 38 mg/dL — ABNORMAL HIGH (ref 6–23)
BUN: 40 mg/dL — ABNORMAL HIGH (ref 6–23)
BUN: 44 mg/dL — ABNORMAL HIGH (ref 6–23)
BUN: 53 mg/dL — ABNORMAL HIGH (ref 6–23)
BUN: 53 mg/dL — ABNORMAL HIGH (ref 6–23)
BUN: 55 mg/dL — ABNORMAL HIGH (ref 6–23)
CO2: 24 mEq/L (ref 19–32)
CO2: 25 mEq/L (ref 19–32)
CO2: 25 mEq/L (ref 19–32)
Calcium: 8.2 mg/dL — ABNORMAL LOW (ref 8.4–10.5)
Calcium: 8.3 mg/dL — ABNORMAL LOW (ref 8.4–10.5)
Calcium: 8.4 mg/dL (ref 8.4–10.5)
Calcium: 8.4 mg/dL (ref 8.4–10.5)
Calcium: 8.5 mg/dL (ref 8.4–10.5)
Calcium: 8.6 mg/dL (ref 8.4–10.5)
Calcium: 8.7 mg/dL (ref 8.4–10.5)
Calcium: 9 mg/dL (ref 8.4–10.5)
Chloride: 100 mEq/L (ref 96–112)
Chloride: 103 mEq/L (ref 96–112)
Chloride: 104 mEq/L (ref 96–112)
Chloride: 99 mEq/L (ref 96–112)
Creatinine, Ser: 1.51 mg/dL — ABNORMAL HIGH (ref 0.4–1.5)
Creatinine, Ser: 1.64 mg/dL — ABNORMAL HIGH (ref 0.4–1.5)
Creatinine, Ser: 1.81 mg/dL — ABNORMAL HIGH (ref 0.4–1.5)
Creatinine, Ser: 1.85 mg/dL — ABNORMAL HIGH (ref 0.4–1.5)
Creatinine, Ser: 1.97 mg/dL — ABNORMAL HIGH (ref 0.4–1.5)
Creatinine, Ser: 1.98 mg/dL — ABNORMAL HIGH (ref 0.4–1.5)
GFR calc Af Amer: 40 mL/min — ABNORMAL LOW (ref >60)
GFR calc Af Amer: 44 mL/min — ABNORMAL LOW (ref >60)
GFR calc non Af Amer: 31 mL/min — ABNORMAL LOW (ref >60)
GFR calc non Af Amer: 33 mL/min — ABNORMAL LOW (ref >60)
GFR calc non Af Amer: 33 mL/min — ABNORMAL LOW (ref >60)
GFR calc non Af Amer: 37 mL/min — ABNORMAL LOW (ref >60)
GFR calc non Af Amer: 39 mL/min — ABNORMAL LOW (ref >60)
Glucose, Bld: 181 mg/dL — ABNORMAL HIGH (ref 70–99)
Glucose, Bld: 184 mg/dL — ABNORMAL HIGH (ref 70–99)
Glucose, Bld: 187 mg/dL — ABNORMAL HIGH (ref 70–99)
Glucose, Bld: 195 mg/dL — ABNORMAL HIGH (ref 70–99)
Glucose, Bld: 286 mg/dL — ABNORMAL HIGH (ref 70–99)
Glucose, Bld: 323 mg/dL — ABNORMAL HIGH (ref 70–99)
Potassium: 4.3 mEq/L (ref 3.5–5.1)
Potassium: 4.8 mEq/L (ref 3.5–5.1)
Potassium: 4.9 mEq/L (ref 3.5–5.1)
Potassium: 5.5 mEq/L — ABNORMAL HIGH (ref 3.5–5.1)
Sodium: 129 mEq/L — ABNORMAL LOW (ref 135–145)
Sodium: 131 mEq/L — ABNORMAL LOW (ref 135–145)
Sodium: 134 mEq/L — ABNORMAL LOW (ref 135–145)
Sodium: 135 mEq/L (ref 135–145)

## 2010-11-15 LAB — DIFFERENTIAL
Basophils Absolute: 0 10*3/uL (ref 0.0–0.1)
Basophils Relative: 0 % (ref 0–1)
Basophils Relative: 0 % (ref 0–1)
Eosinophils Absolute: 0 10*3/uL (ref 0.0–0.7)
Eosinophils Relative: 0 % (ref 0–5)
Eosinophils Relative: 4 % (ref 0–5)
Monocytes Absolute: 0.5 10*3/uL (ref 0.1–1.0)
Monocytes Relative: 10 % (ref 3–12)
Neutro Abs: 3.1 10*3/uL (ref 1.7–7.7)
Neutrophils Relative %: 90 % — ABNORMAL HIGH (ref 43–77)

## 2010-11-15 LAB — GLUCOSE, CAPILLARY
Glucose-Capillary: 112 mg/dL — ABNORMAL HIGH (ref 70–99)
Glucose-Capillary: 113 mg/dL — ABNORMAL HIGH (ref 70–99)
Glucose-Capillary: 141 mg/dL — ABNORMAL HIGH (ref 70–99)
Glucose-Capillary: 155 mg/dL — ABNORMAL HIGH (ref 70–99)
Glucose-Capillary: 160 mg/dL — ABNORMAL HIGH (ref 70–99)
Glucose-Capillary: 160 mg/dL — ABNORMAL HIGH (ref 70–99)
Glucose-Capillary: 165 mg/dL — ABNORMAL HIGH (ref 70–99)
Glucose-Capillary: 174 mg/dL — ABNORMAL HIGH (ref 70–99)
Glucose-Capillary: 180 mg/dL — ABNORMAL HIGH (ref 70–99)
Glucose-Capillary: 201 mg/dL — ABNORMAL HIGH (ref 70–99)
Glucose-Capillary: 203 mg/dL — ABNORMAL HIGH (ref 70–99)
Glucose-Capillary: 232 mg/dL — ABNORMAL HIGH (ref 70–99)
Glucose-Capillary: 237 mg/dL — ABNORMAL HIGH (ref 70–99)
Glucose-Capillary: 238 mg/dL — ABNORMAL HIGH (ref 70–99)
Glucose-Capillary: 247 mg/dL — ABNORMAL HIGH (ref 70–99)
Glucose-Capillary: 250 mg/dL — ABNORMAL HIGH (ref 70–99)
Glucose-Capillary: 262 mg/dL — ABNORMAL HIGH (ref 70–99)
Glucose-Capillary: 277 mg/dL — ABNORMAL HIGH (ref 70–99)
Glucose-Capillary: 338 mg/dL — ABNORMAL HIGH (ref 70–99)
Glucose-Capillary: 383 mg/dL — ABNORMAL HIGH (ref 70–99)

## 2010-11-15 LAB — CULTURE, BLOOD (ROUTINE X 2): Culture: NO GROWTH

## 2010-11-15 LAB — HEMOGLOBIN A1C
Hgb A1c MFr Bld: 7.3 % — ABNORMAL HIGH (ref <5.7)
Mean Plasma Glucose: 163 mg/dL — ABNORMAL HIGH (ref <117)

## 2010-11-15 LAB — HEPARIN LEVEL (UNFRACTIONATED)
Heparin Unfractionated: 0.62 IU/mL (ref 0.30–0.70)
Heparin Unfractionated: 0.87 IU/mL — ABNORMAL HIGH (ref 0.30–0.70)
Heparin Unfractionated: 0.94 IU/mL — ABNORMAL HIGH (ref 0.30–0.70)

## 2010-11-15 LAB — BRAIN NATRIURETIC PEPTIDE: Pro B Natriuretic peptide (BNP): 420 pg/mL — ABNORMAL HIGH (ref 0.0–100.0)

## 2010-11-15 LAB — PROTIME-INR
INR: 1.14 (ref 0.00–1.49)
INR: 1.19 (ref 0.00–1.49)
Prothrombin Time: 14.3 seconds (ref 11.6–15.2)

## 2010-11-15 LAB — MAGNESIUM: Magnesium: 2.2 mg/dL (ref 1.5–2.5)

## 2010-11-16 LAB — CULTURE, BLOOD (ROUTINE X 2)

## 2010-11-16 LAB — FERRITIN: Ferritin: 126 ng/mL (ref 22–322)

## 2010-11-16 LAB — POCT I-STAT, CHEM 8
Calcium, Ion: 1.1 mmol/L — ABNORMAL LOW (ref 1.12–1.32)
HCT: 43 % (ref 39.0–52.0)
Hemoglobin: 14.6 g/dL (ref 13.0–17.0)
Sodium: 137 mEq/L (ref 135–145)
TCO2: 23 mmol/L (ref 0–100)

## 2010-11-16 LAB — CARDIAC PANEL(CRET KIN+CKTOT+MB+TROPI)
Relative Index: INVALID (ref 0.0–2.5)
Total CK: 50 U/L (ref 7–232)
Troponin I: 0.05 ng/mL (ref 0.00–0.06)

## 2010-11-16 LAB — CBC
HCT: 32.1 % — ABNORMAL LOW (ref 39.0–52.0)
HCT: 40.1 % (ref 39.0–52.0)
MCHC: 32.6 g/dL (ref 30.0–36.0)
MCHC: 34 g/dL (ref 30.0–36.0)
MCV: 89.9 fL (ref 78.0–100.0)
MCV: 90.1 fL (ref 78.0–100.0)
Platelets: 123 10*3/uL — ABNORMAL LOW (ref 150–400)
Platelets: 169 10*3/uL (ref 150–400)
RDW: 15.3 % (ref 11.5–15.5)
WBC: 9.7 10*3/uL (ref 4.0–10.5)

## 2010-11-16 LAB — COMPREHENSIVE METABOLIC PANEL
Albumin: 2.9 g/dL — ABNORMAL LOW (ref 3.5–5.2)
BUN: 33 mg/dL — ABNORMAL HIGH (ref 6–23)
Creatinine, Ser: 1.6 mg/dL — ABNORMAL HIGH (ref 0.4–1.5)
Total Bilirubin: 0.7 mg/dL (ref 0.3–1.2)
Total Protein: 5.9 g/dL — ABNORMAL LOW (ref 6.0–8.3)

## 2010-11-16 LAB — URINALYSIS, ROUTINE W REFLEX MICROSCOPIC
Ketones, ur: NEGATIVE mg/dL
Leukocytes, UA: NEGATIVE
Nitrite: NEGATIVE
Protein, ur: 30 mg/dL — AB
Urobilinogen, UA: 0.2 mg/dL (ref 0.0–1.0)

## 2010-11-16 LAB — RPR: RPR Ser Ql: NONREACTIVE

## 2010-11-16 LAB — DIFFERENTIAL
Basophils Relative: 1 % (ref 0–1)
Eosinophils Absolute: 0.1 10*3/uL (ref 0.0–0.7)
Eosinophils Relative: 1 % (ref 0–5)
Lymphs Abs: 1.1 10*3/uL (ref 0.7–4.0)
Monocytes Relative: 6 % (ref 3–12)
Neutrophils Relative %: 81 % — ABNORMAL HIGH (ref 43–77)

## 2010-11-16 LAB — IRON AND TIBC: Iron: 33 ug/dL — ABNORMAL LOW (ref 42–135)

## 2010-11-16 LAB — RETICULOCYTES
RBC.: 4.05 MIL/uL — ABNORMAL LOW (ref 4.22–5.81)
Retic Count, Absolute: 40.5 10*3/uL (ref 19.0–186.0)

## 2010-11-16 LAB — LACTIC ACID, PLASMA: Lactic Acid, Venous: 1.4 mmol/L (ref 0.5–2.2)

## 2010-11-16 LAB — FOLATE: Folate: >20 ng/mL

## 2010-11-16 LAB — TSH: TSH: 2.552 u[IU]/mL (ref 0.350–4.500)

## 2010-11-16 LAB — URINE MICROSCOPIC-ADD ON

## 2010-11-16 LAB — VITAMIN B12: Vitamin B-12: 649 pg/mL (ref 211–911)

## 2010-11-21 LAB — URINALYSIS, ROUTINE W REFLEX MICROSCOPIC
Bilirubin Urine: NEGATIVE
Glucose, UA: NEGATIVE mg/dL
Hgb urine dipstick: NEGATIVE
Protein, ur: NEGATIVE mg/dL
Urobilinogen, UA: 0.2 mg/dL (ref 0.0–1.0)

## 2010-11-21 LAB — COMPREHENSIVE METABOLIC PANEL
ALT: 16 U/L (ref 0–53)
AST: 25 U/L (ref 0–37)
Albumin: 3.9 g/dL (ref 3.5–5.2)
Alkaline Phosphatase: 79 U/L (ref 39–117)
GFR calc Af Amer: 40 mL/min — ABNORMAL LOW (ref >60)
Glucose, Bld: 230 mg/dL — ABNORMAL HIGH (ref 70–99)
Potassium: 4.5 mEq/L (ref 3.5–5.1)
Sodium: 138 mEq/L (ref 135–145)
Total Protein: 7.8 g/dL (ref 6.0–8.3)

## 2010-11-21 LAB — DIFFERENTIAL
Basophils Relative: 0 % (ref 0–1)
Eosinophils Absolute: 0.1 10*3/uL (ref 0.0–0.7)
Eosinophils Relative: 1 % (ref 0–5)
Monocytes Absolute: 0.5 10*3/uL (ref 0.1–1.0)
Monocytes Relative: 7 % (ref 3–12)

## 2010-11-21 LAB — CBC
Hemoglobin: 12.9 g/dL — ABNORMAL LOW (ref 13.0–17.0)
Platelets: 196 10*3/uL (ref 150–400)
RDW: 14.8 % (ref 11.5–15.5)

## 2011-01-10 NOTE — H&P (Signed)
[name], [name] NO.:  [id number]   MEDICAL RECORD NO.:  [id number]          PATIENT TYPE:  OBV   LOCATION:  1522                         FACILITY:  [facility]   PHYSICIAN:  Tera Mater. [place], M.D. DATE OF BIRTH:  10/27/1914   DATE OF ADMISSION:  06/22/2008  DATE OF DISCHARGE:                              HISTORY & PHYSICAL   HISTORY OF PRESENT ILLNESS:  [name] is a 77 year old white male well-  known to me from my private practice with a history of prior stroke,  coronary disease, mitral valve surgery, hypothyroidism and prior colon  cancer.  Patient has been doing moderately well recently with a general  failure to thrive, age based and also based on the loss of his wife.  He  was working on his mower this weekend, fell backwards onto his left arm  and hip.  Extensive workup has been done in the emergency room.  Unfortunately nothing is fractured but he is found to have a very high  INR and is admitted for dehydration treatment with supratherapeutic INR  and evaluation of gait as well as pain management.  At the present time,  it does hurt somewhat to take a deep breath.  He had no loss of  consciousness with this fall.  He has no cardiac symptoms.  Bowels have  been doing fairly well.  He still has some urinary frequency.  He has  had no fevers, chills or sweats.  He has had one other fall fairly  recently.  He does not have any headache or neurologic complaints.   PAST MEDICAL HISTORY:  1. Colon cancer, two episodes, the last being in 2001.  2. Duodenal ulcer due to GI bleed from H.  Pylori.  3. Prior strokes.  4. Hypothyroidism.  5. Coronary artery disease with congestive heart failure with a      preserved ejection fraction in 2004.  6. Mitral valve disease and was treated with a sequin high      annuloplasty ring and October 2003.  7. Hypertension.  8. Chronic atrial fibrillation.  9. Nephropathy with creatinine 1.4.  10.BPH.  11.Abnormal PSA.  12.Mitral surgery.  13.Partial colectomy in 1995 and 1996.  14.Colon polyps in 2006.  15.Pacemaker placement.  16.Tonsils removed.  17.Bilateral cataract removal.  18.Hernia repair.  19.Basal cell carcinoma of his left ear.   SOCIAL HISTORY:  He is widowed this year after almost 70 years of  marriage.  He has two children, five grandchildren.   FAMILY HISTORY:  Father died of bladder cancer.  Mother died of stroke.   CURRENT MEDICATIONS:  1. Multivitamin.  2. Synthroid 100 mcg daily.  3. Coumadin 5.  4. Metoprolol XL 25.  5. Prilosec 20.  6. Vesicare 5.  7. Maybe also Enablex 15, it is not clear.  8. Folic acid 1 mg.  9. Darvocet.   ALLERGIES:  SULFA.   PHYSICAL EXAMINATION:  VITAL SIGNS:  Blood pressure 112/50, pulse 69,  respirations 18, temperature 97.6.  O2 sat was 98%.  GENERAL:  He is fairly healthy-appearing, sitting up eating some dinner.  HEENT:  Sclerae anicteric.  Face is symmetric with no evidence of head  trauma.  Tympanic membranes are clear.  Face is generally symmetric.  Dentition is intact.  No oral membrane changes are present.  He has no  evidence of tongue biting.  Extraocular movements are intact with no  evidence of entrapment syndrome.  NECK:  Supple without bruits.  LUNGS:  Clear, moving air well with no rub.  No accessory muscle use.  HEART:  Irregular irregular with systolic murmur.  ABDOMEN:  Soft, nontender without masses.  EXTREMITIES:  Reveal extensive left upper extremity bruising from the  armpit all the way to the fingers.  There is a small open wound on his  left elbow, over the olecranon process.  Left thigh and hip bruising is  present, more posteriorly.  Distal pulses are relatively well-preserved  with 1+ edema which is about his baseline.  He is alert, awake,  mentating well.  Clear but slow speech.  No resting tremors present.  No  evidence of hallucination or delusions present.   LABORATORY DATA:  Chest x-ray was okay.  No  pneumothorax.  No clear rib  fractures.  Left arm and elbow x-rays were okay.  Hip x-ray was okay.  CT of the head was okay with no acute changes.  UA was negative.  White  count 5700, hemoglobin 10.3, platelets 166,000.  Sodium 134, potassium  4.4, chloride 106, CO2 24, BUN 40, creatinine 1.67, glucose 105, calcium  is 8.5.  CPK is 166.   In summary, we have a 77 year old white male with a fall with rib  injury.  Clinically, he has at least bruised these ribs if not cracked  them, but cannot be seen on x-ray.  He also has some volume deficits due  to pain and poor intake and fortunately has exhibited no rhabdomyolysis.  We are going to bring him in for pain management, watch his pulmonary  status, gently hydrate him and recheck his labs in the morning.  For his  supratherapeutic INR, he will have a dose of vitamin K given.  He has  already been seen by Cardiology, and I think we are going to take him  off his Coumadin and put him on aspirin based on this episode.  Will  need to watch his GI status carefully.  At the present time, his  neurologic status intact.  No evidence of any head trauma or concussion  from this injury.  Despite the extensive bruising, somehow he managed  not the break anything.           ______________________________  Tera Mater. Evlyn Kanner, M.D.     [name]  D:  06/22/2008  T:  06/23/2008  Job:  [id number]

## 2011-01-10 NOTE — Procedures (Signed)
DUPLEX DEEP VENOUS EXAM - LOWER EXTREMITY   INDICATION:  Left lower extremity swelling/edema.   HISTORY:  Edema:  Yes  Trauma/Surgery:  No  Pain:  Yes  PE:  No  Previous DVT:  No  Anticoagulants:  Coumadin  Other:   DUPLEX EXAM:                CFV   SFV   PopV   PTV   GSV                R  L  R  L  R  L   R  L  R  L  Thrombosis    0  0     0     0      0     0  Spontaneous   +  +     +     +      +     +  Phasic        +  +     +     +      +     +  Augmentation  +  +     +     +      +     +  Compressible  +  +     +     +.     +     +  Competent     +  +     +     +      +     +   Legend:  + - yes  o - no  p - partial  D - decreased   IMPRESSION:  1. Left lower extremity deep venous system was imaged, dopplered, and      shows no evidence of deep venous thrombosis.  2. The contralateral common femoral vein was imaged, dopplered, and      shows no evidence of deep venous thrombosis.    _____________________________  P. Liliane Bade, M.D.   AS/MEDQ  D:  03/05/2007  T:  03/05/2007  Job:  [id number]

## 2011-01-10 NOTE — Discharge Summary (Signed)
[name], [name] NO.:  [id number]   MEDICAL RECORD NO.:  [id number]          PATIENT TYPE:  INP   LOCATION:  1522                         [facility]ACILITY:  [facility]   PHYSICIAN:  Kari Baars, M.D.  DATE OF BIRTH:  06-27-1915   DATE OF ADMISSION:  06/22/2008  DATE OF DISCHARGE:  06/24/2008                               DISCHARGE SUMMARY   DISCHARGE DIAGNOSES:  1. Fall with multiple left-sided ecchymoses/contusions (left ribs,      left elbow, left hip).  2. Failure to thrive.  3. Supratherapeutic increasing international normalized ratio      secondary to Coumadin therapy.  4. Coronary artery disease with congestive heart failure (preserved      ejection fraction in 2004).  5. Mitral valve disease with ischemia, status post annuloplasty ring      (October 2003).  6. History of colon cancer x2, (most recently in 2001).  7. History of duodenal ulcer secondary to Helicobacter pylori.  8. History of cerebrovascular accident.  9. Hypertrophy of thyroidism.  10.Hypertension.  11.Chronic atrial fibrillation.  12.Chronic renal insufficiency with baseline creatinine 1.4.  13.Benign prostatic hypertrophy.  14.Elevated prostatic specific antigen.  15.Status post partial colectomy (1995 and 1996).  16.Colon polyps (2006).  17.Status post pacemaker placement.  18.Status post tonsillectomy.  19.Status post bilateral cataract repair.  20.Hernia repair.  21.Basal cell carcinoma of the left ear.   DISCHARGE MEDICATIONS:  1. Folic acid 1 mg daily.  2. Toprol XL 25 mg daily.  3. Multivitamin daily.  4. Omeprazole 20 mg daily.  5. Darvocet N 100 q.6 h., p.r.n. (#60/zero refills).  6. Synthroid 100 mcg daily.  7. Aspirin 81 mg daily - start in 2 weeks.  8. Vesicare or Enablex - resume prior dose.  He was instructed not to      take both.  9. Stop Coumadin.   HOSPITAL PROCEDURES:  1. CT of the head without contrast (June 22, 2008), negative for a      bleed or other  acute intracranial process.  Atrophy and nonspecific      white matter changes.  2. Left hip x-ray, no acute findings.  3. Left forearm x-ray - no acute findings.  4. Left rib x-ray - cardiomegaly with prior chest surgery and      pacemaker.  Minimal plate-like atelectasis in the left lower lobe.      No definite acute rib fracture, pneumothorax, or hemothorax.  5. Left elbow x-ray - focal soft tissue swelling over the olecranon      with no acute bony findings.   HISTORY OF PRESENT ILLNESS:  For full details, please see dictated  history and physical by Dr. Evlyn Kanner.  Briefly, [name] is a 77 year old  white male with a history of prior stroke, atrial fibrillation on  chronic anticoagulation therapy, coronary artery disease, and status  post mitral valve annuloplasty, who presented to the emergency  department on June 22, 2008, following a fall.  The patient was  living independently alone prior to admission and was working on his  Surveyor, mining.  He subsequently fell backwards  and landed on his left hip  and arm.  He was brought to the emergency department where is noted to  have multiple ecchymoses and underwent an extensive x-ray evaluation,  including head CT, left arm and elbow, left hip, and left rib x-rays.  These were negative for fracture or subdural hematoma.  He was  complaining of pleuritic type left-sided chest pain.  Otherwise, he was  without significant complaint.  Of note, his INR upon presentation was  8.9.  Therefore, he was admitted for monitoring for complications from  supratherapeutic INR with his multiple ecchymoses.   HOSPITAL COURSE:  The patient was admitted to a medical bed.  His  Coumadin was held, and he was administered vitamin K for his  supratherapeutic INR.  On the following day, his INR was greater than  9.8 on two occasions.  Another dose of vitamin K was administered.  On  the day of discharge, his INR is down to 4.3.  He has not had any   complications from his fall with no significant hematoma.  He does have  left elbow bursitis which appears chronic in nature.  His ecchymoses are  stable with no signs of hematoma.  His blood counts remained stable with  hemoglobin of 10.9 on the day of discharge.  At this point, the patient  is stable for discharge home.  He will be evaluated by physical therapy  prior to discharge for discharge needs and home health physical  therapy/occupational therapy will be arranged for safety evaluation and  ongoing therapy.  If he continues to have falls, he may warrant a higher  level of care, but at that this point he wants to return home.   Of note, the patient's Coumadin will be held at this point.  A  consultation was undertaken by Dr. [name] from cardiology, who  agreed that the risk of anticoagulation at this point exceeds his risk  of stroke with atrial fibrillation despite his high CHADs score.  He was  advised to start an aspirin in 2 weeks to reduce the risk of stroke, but  his Coumadin will be discontinued indefinitely at this point.   DISCHARGE DIET:  Cardiac prudent.   DISCHARGE LABS:  CBC shows a white count of 6.5, hemoglobin 10.9,  platelets 192.  BMET significant for sodium 138, potassium 4.4, chloride  109, bicarb 22, BUN 32, creatinine 1.4, glucose 120, INR 4.3.  Mixing  studies consistent with anticoagulation therapy.   DISPOSITION:  To home with home health physical therapy and occupational  therapy.   CONDITION ON DISCHARGE:  Improved.      Kari Baars, M.D.  Electronically Signed     WS/MEDQ  D:  06/24/2008  T:  06/24/2008  Job:  [id number]

## 2011-01-10 NOTE — Consult Note (Signed)
NAMEDEJION, GRILLO NO.:  [id number]   MEDICAL RECORD NO.:  [id number]          PATIENT TYPE:  INP   LOCATION:  1522                         FACILITY:  [facility]   PHYSICIAN:  Lyn Records, M.D.   DATE OF BIRTH:  14-Sep-1914   DATE OF CONSULTATION:  DATE OF DISCHARGE:                                 CONSULTATION   REASON FOR CONSULTATION:  Cardiovascular disease.   CONCLUSIONS:  1. Tachybrady syndrome.      a.     Chronic underlying atrial fibrillation.      b.     VVI pacemaker.  2. Coagulopathy secondary to unmonitored Coumadin therapy.  The      patient has not had an INR done in our practice for over 6 months      and left [place] with the impression that this was being monitored by      Dr. Evlyn Kanner.  Not sure that the patient really understands the      significance of this and may have forgotten to arrange follow-ups.  3. Recent falls with ecchymoses of left arm and hip.  4. History of embolic CVA.  5. History of mitral valve repair.  This was done in 2003.   RECOMMENDATIONS:  1. Hold Coumadin  2. Administer vitamin K.  3. Daily INR.  4. The patient's CHADS score is 75.  If at all possible, continue      Coumadin therapy would be preferable.  If the patient will not be      able to make follow-ups or continues to be a significant fall risk,      Coumadin therapy may not be reasonable and switch to aspirin would      need to be considered.   COMMENTS:  The patient is 77 years of age.  He has not been seen in our  office over a year.  He denies cardiovascular complaints.  He fell  several days ago while trying to mow the grass.  He has bruises on his  arm, in his left chest, and along his left hip.  X-rays demonstrate no  evidence of fracture.   He has a history of an embolic stroke in the past.  He has chronic  atrial fibrillation and is status post mitral valve repair.  He is known  to have normal LV function by the most recent echo done 4 or 5 years  ago.   He denies orthopnea, PND, chest pain, and syncope.   MEDICATIONS ON ADMISSION:  1. Coumadin 5 mg take as directed.  2. Folic acid 1 mg daily.  3. Metoprolol tartrate 25 mg daily.  4. Multiple vitamins 1 per day.  5. Synthroid 100 mcg per day.  6. Darvocet-N 100 as needed.  7. Omeprazole 20 mg per day.   ALLERGIES:  None.   FAMILY HISTORY:  Noncontributory.   OBJECTIVE:  GENERAL:  On exam, the patient is lying on the gurney in the  emergency room, having no discomfort at rest.  Moving his left arm or  his torso causes pain in the arm and chest.  He also has left hip  discomfort with movement.  There are ecchymoses noted on along the left  arm and elbow and also in the left flank down to the mid thigh.  There  is no palpable tenderness in the flank.  VITAL SIGNS:  The blood pressure is 110/70.  The heart rate is 70,  respirations 16, O2 saturation 97%.  CARDIAC:  Exam reveals a scratchy 1/6 systolic murmur.  No gallop.  LUNGS:  Clear anteriorly.  ABDOMEN:  Soft.  EXTREMITIES:  Reveal no edema.   LABORATORY DATA:  The electrocardiogram demonstrates anterior T-wave  abnormality, atrial fibrillation with a controlled ventricular response  at 60 beats per minute.  Interventricular conduction delay is noted.  Anterior T-wave abnormality is noted.  Laboratory data reveals an INR  8.9 and PT 82.9.  Stool is negative for blood.  Hemoglobin 9.6.  BUN and  creatinine 40 and 1.67.  CPK is normal at 166.  Chest x-ray revealed  cardiomegaly, but no CHF.   DISCUSSION:  The patient was getting older and has gotten to the point  where he may not be able to remain independent.  If he will be in an  environment where his INR and Coumadin therapy can be follow with  reliability, he should continue therapy.  Otherwise, despite his high  CHADS score, he should probably be switched to an aspirin per day.  Will  discuss with Dr. Evlyn Kanner.      Lyn Records, M.D.  Electronically  Signed     HWS/MEDQ  D:  06/23/2008  T:  06/24/2008  Job:  [id number]

## 2011-01-10 NOTE — Discharge Summary (Signed)
[name], [name] NO.:  [id number]   MEDICAL RECORD NO.:  [id number]          PATIENT TYPE:  INP   LOCATION:  1522                         FACILITY:  [facility]   PHYSICIAN:  Kari Baars, M.D.  DATE OF BIRTH:  1915/01/30   DATE OF ADMISSION:  06/22/2008  DATE OF DISCHARGE:  06/25/2008                               DISCHARGE SUMMARY   ADDENDUM   This is an addendum to prior discharge summary from June 24, 2008.  For details, please see that discharge summary.  There has been no  significant change in [name] condition.  However, he was evaluated  by Physical Therapy and had several episodes of loss of balance while  trying to walk with a rolling walker and assistance.  His brother who  was present did not feel that he was safe to go home due to lack of 24-  hour assistance.  Skilled nursing facility rehab was recommended.  It  does appear that the patient has some progressive failure to thrive with  generalized weakness, unsteady gait, which is multifactorial due to his  prior strokes, deconditioning, and age-related changes.  Hopefully with  some intensive rehab, he will be able to return home with assistance.  At this point, he is not safe for discharge to home alone and skilled  nursing facility arrangements will be made.   DISPOSITION:  To skilled nursing facility rehab when bed available.      Kari Baars, M.D.  Electronically Signed     WS/MEDQ  D:  06/25/2008  T:  06/25/2008  Job:  [id number]

## 2011-01-10 NOTE — Op Note (Signed)
NAMECARROLL, LINGELBACH               ACCOUNT NO.:  [id number]   MEDICAL RECORD NO.:  [id number]          PATIENT TYPE:  OIB   LOCATION:  1616                         FACILITY:  [facility]   PHYSICIAN:  Leonides Grills, M.D.     DATE OF BIRTH:  09/30/1914   DATE OF PROCEDURE:  03/14/2007  DATE OF DISCHARGE:                               OPERATIVE REPORT   PREOPERATIVE DIAGNOSIS:  Left great toe distal phalanx osteomyelitis.   POSTOPERATIVE DIAGNOSIS:  Left great toe distal phalanx osteomyelitis.   OPERATION:  1. Left great toe amputation through IP joint.  2. Left EHL to FHL tendon transfer.  3. Left great toe proximal phalanx head resection.   ANESTHESIA:  General.   SURGEON:  Leonides Grills, MD.   ASSISTANT:  Evlyn Kanner, PA-C.   ESTIMATED BLOOD LOSS:  Minimal.   TOURNIQUET:  None.   COMPLICATION:  None.   DISPOSITION:  Stable to PR.   INDICATIONS:  This is a 77 year old male who has had ulceration at the  tip of his left great toe that was chronic.  He subsequently developed  osteomyelitis with x-ray findings consistent with the above.  He was  consented for the above procedure.  All risks which include infection,  nerve or vessel injury, more proximal amputation, deformity, stiffness  were all explained.  Questions were encouraged and answered.   OPERATION:  The patient brought to the operating room, placed in supine  position after adequate general G tube anesthesia administered as well  as Ancef gram IV piggyback.  Left lower extremity is then prepped and  draped in sterile manner.  No tourniquet was used.  A plantar based flap  was then developed.  Dissection was carried down directly to bone,  distal phalanx was then shelled out.  FHL was tenotomized from the  plantar base of the distal phalanx and the EHL was tenotomized from the  dorsal base of the distal phalanx.  Phalanx was then removed and sent to  pathology as well as the dorsal aspect of the nail and soft tissue  with  the ulcer itself.  The plantar fat pad was used as a large flap.  The  area was copiously irrigated with normal saline.  There was no purulence  in this area and was nice and clean.  The EHL was then transferred to  the FHL and this was repaired with 2-0 Vicryl stitch subcu.  Again, this  was copiously irrigated with normal saline.  A portion of the proximal  phalanx head was also removed as  well.  Once the head was removed, the rongeur and the EHL was  transferred to the FHL and repaired with 2-0 Vicryl stitch.  We then  copiously be the area with normal saline.  Subcu was closed 3-0 Vicryl.  Skin was closed with 4-0 nylon.  Sterile dressing was applied.  Hard  sole shoe was applied.  Patient stable to PR.      Leonides Grills, M.D.  Electronically Signed     PB/MEDQ  D:  03/14/2007  T:  03/15/2007  Job:  [id number]

## 2011-01-13 NOTE — Op Note (Signed)
NAME:  [name], FILLER NO.:  [id number]   MEDICAL RECORD NO.:  [id number]                   PATIENT TYPE:  INP   LOCATION:  2312                                 FACILITY:  [facility]   PHYSICIAN:  Gwenith Daily. Tyrone Sage, M.D.            DATE OF BIRTH:  12-Apr-1915   DATE OF PROCEDURE:  06/18/2002  DATE OF DISCHARGE:                                 OPERATIVE REPORT   PREOPERATIVE DIAGNOSES:  Severe mitral regurgitation and moderate tricuspid  regurgitation with congestive heart failure.   POSTOPERATIVE DIAGNOSES:  Severe mitral regurgitation and moderate tricuspid  regurgitation with congestive heart failure.  Flailed posterior leaflet of  mitral valve.   OPERATION PERFORMED:  Quadrangular resection of the posterior leaflet of the  mitral valve with repair of the mitral valve and placement of #30 Seguin  mitral valve annuloplasty ring.  Annuloplasty of the tricuspid.  Oversewing  of left atrial appendage.   SURGEON:  Gwenith Daily. Tyrone Sage, M.D.   ASSISTANT:  Maple Mirza, P.A.   ANESTHESIA:  General endotracheal.   INDICATIONS FOR PROCEDURE:  The patient is an 77 year old male who has  remained active but has for several years known to have mitral  regurgitation.  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  Because of the worsening mitral regurgitation and worsening symptoms of  congestive heart failure, consideration for mitral valve repair or  replacement was discussed with the patient.  The patient was willing to  proceed with the surgery.  Cardiac catheterization was performed by Dr.  [name] which confirmed a diagnosis of severe mitral regurgitation with  moderate pulmonary hypertension, relatively well-preserved left ventricular  function with ejection fraction of 40 to 50%.  The  patient had luminal  irregularities in his coronary arteries but no high grade coronary stenosis.  The risks and options were discussed with the patient, who agreed and signed  informed consent.   DESCRIPTION OF PROCEDURE:  With Swann-Ganz and arterial line monitors in  place, the patient underwent general endotracheal anesthesia without  incident.  The skin of the chest and legs was prepped with Betadine and  draped in the usual sterile manner.  A median sternotomy was performed.  The  pericardium was opened.  The patient had evidence of a very significant left  atrial and right atrial enlargement.  He was systemically heparinized.  The  ascending aorta and the descending aorta were each cannulated . The  ascending aorta was cannulated.  The superior and inferior vena cava  cannulas were placed.  Caval tapes inferior and superiorly were also placed.  The patient was placed on cardiopulmonary bypass at 2.4L per minute per  meter squared.  A transesophageal echocardiogram probe had been placed with  findings dictated under a separate note, but briefly, the patient  had  evidence of severe flail of a portion of the posterior leaflet with severe  mitral regurgitation.  In addition, he had at least moderate tricuspid  regurgitation probably based on annular dilatation.  The patient's body  temperature was cooled to 30 degrees.  An aortic cross clamp was applied.  500 cc of cold blood potassium cardioplegia was administered antegrade with  rapid diastolic arrest of the heart.  Myocardial septal temperature was  monitored throughout the cross clamp period.  Caval tapes were secured and  the right atrium opened slightly to allow direct cannulation of the coronary  sinus.  The left atrium and the interatrial grove was then opened and with  proper retraction, a good visualization of the mitral valve was obtained.  The patient had obvious flailed portion of the middle scallop of the  posterior  leaflet.  P1 and P3 were relatively normal in their apposition to  the anterior leaflet.  There was very slight elongation of the little  portion of the anterior leaflet.  The flail portion of the posterior leaflet  was resected.  A pledgeted #2 Tycron suture was placed in the annulus.  Using 5-0 Ethibond figure-of-eight sutures, the leaflet edges of the  posterior leaflet were reapproximated.  The valve was then sized for a #30  DIRECTV annuloplasty ring.  Prior to placement of the ring, the valve  was hydrostatically tested and appeared to be competent with what appeared  to be a satisfactory repair.  Using #2 Tycron pledgeted sutures  circumferentially, the annuloplasty ring was secured in place.  Again the  valve passed the test and appeared to be competent.  Additional cold blood  cardioplegia was administered directly into the coronary sinus.  The  attention was then turned to the tricuspid valve which was examined.  The  leaflets themselves each appeared intact.  There were no flailed segments.  It was decided that an annuloplasty would be satisfactory to complete the  repair to decrease the tricuspid regurgitation.  Using a #2 Tycron pledgeted  suture, this was placed horizontally just above the coronary sinus along the  base of the leaflet anteriorly parallel bites were taken along the base of  the leaflet intermittently placing a small pledget on to the suture.  This  extended across the base of the leaflet.  The valve was then tied  effectively decreasing the overall orifice diameter.  The valve appeared  competent at the completion of this.  The body temperature was rewarmed.  A  vent was placed across the mitral valve while the heart was passively filled  to deair the heart.  Prior to closure of the atrium, using a horizontal  mattress running 4-0 Prolene, the base of the left atrial appendage was obliterated shut.  The atrium was then closed with horizontal mattress   suture.  Prior to complete closure, deairing maneuvers were carried out.  Aortic cross clamp was released.  The right side of the heart was allowed to  passively fill on coronary flow.  The right atrium was then closed in a  similar fashion with a horizontal mattress 3-0 Prolene suture and reinforced  with a running Prolene suture.  Total cross clamp time was 111 minutes.  The  patient spontaneously converted to a sinus rhythm.  A 16 gauge needle was  introduced in the left ventricular apex to further deair the heart.  The  patient's body temperature was rewarmed to 37 degrees.  He was then  ventilated and  the heart was allowed to fill.  Then the superior  transesophageal echocardiogram showed good functioning of both valves  without significant mitral or tricuspid regurgitation.  Overall ventricular  function was adequate.  Patient separated from cardiopulmonary bypass on low  dose dopamine and milrinone.  He remained hemodynamically stable, was  decannulated in the usual fashion.  Protamine sulfate was administered.  With the operative field hemostatic, two atrial and two ventricular pacing  wires were applied.  The pericardium was reapproximated.  Sternum was closed  with #6 stainless steel wire.  Fascia closed with interrupted 0 Vicryl,  running 3-0 Vicryl in the subcutaneous tissues and 4-0 subcuticular stitch  in the skin edges.  Dry dressings were applied.  Sponge and needle counts  were reported as correct at the completion of the procedure.  The patient  tolerated the procedure without obvious complication and was transferred to  the surgical intensive care unit for further postoperative care.                                               Gwenith Daily Tyrone Sage, M.D.    Tyson Babinski  D:  06/22/2002  T:  06/23/2002  Job:  [id number]   cc:   Lesleigh Noe, M.D.  [address]  [address]  [place]  [place] [address]  Fax: [phone]

## 2011-01-13 NOTE — Cardiovascular Report (Signed)
NAME:  [name], [name] NO.:  [id number]   MEDICAL RECORD NO.:  [id number]                   PATIENT TYPE:  OIB   LOCATION:  2899                                 FACILITY:  [facility]   PHYSICIAN:  Lesleigh Noe, M.D.            DATE OF BIRTH:  11-28-1914   DATE OF PROCEDURE:  04/21/2002  DATE OF DISCHARGE:                              CARDIAC CATHETERIZATION   INDICATION:  Severe mitral regurgitation. This study is being done to rule  out significant coronary artery disease.   PROCEDURE PERFORMED:  1. Left heart catheterization.  2. Right heart catheterization.  3. Selective coronary angiography  4. Left ventriculography.   DESCRIPTION OF PROCEDURE:  After informed consent, a 6 French sheath was  placed in the right femoral artery, and an 8 French sheath in the right  femoral vein, both using the modified Seldinger technique. A 6 French A2  multipurpose catheter was used for hemodynamic recordings, left  ventriculography was performed with a 6 French angled pigtail catheter.  Right coronary angiography was performed with a 6 [place] A2 multipurpose  catheter and a 6 French left Judkins catheter was used for left coronary  angiography. A 7 French Swan-Ganz catheter was used for right heart  recordings.  A simultaneous wedge LV was performed.  Thermodilution cardiac  outputs were performed and a pullback was recorded.  The patient tolerated  the procedure without complications.   RESULTS:  I:  HEMODYNAMIC DATA:  a.  Aortic pressure 114/70.  b.  Left ventricular pressure 112/14.  c.  RA mean 13 mmHg.  d.  RV pressure 47/12.  e.  PA pressure 51/22 (36 mean).  f.  Pulmonary capillary wedge mean 32 mmHg, V wave 64 mmHg.  g.  Cardiac output by thermodilution 2.4, by Fick 2.4.   II:  LEFT VENTRICULOGRAPHY:  The left ventricle is dilated.  Contractility  is normal. EF is at least 55 to perhaps 65%.  The patient is in chronic  atrial fibrillation.  Severe, 4+ mitral regurgitation is noted.  The left  atrium is markedly dilated.   III:  CORONARY ANGIOGRAPHY:  a.  Left main coronary artery:  Irregular with up to 20% narrowing.  b.  Left anterior descending coronary:  The LAD reaches the left ventricular  apex.  It gives origin to a proximal first diagonal. This vessel contains  irregularities.  The proximal LAD contains a 30% narrowing. No high-grade  obstruction in the LAD is noted.  c.  Circumflex artery:  Three obtuse marginals arise, irregularities are  noted. No high-grade obstruction is seen.  d.  Right coronary:  This is a large dominant vessel that giving a large PDA  and two large left ventricular branches. Proximal mid and distal  irregularities are noted but no high-grade obstruction is seen.   CONCLUSIONS:  1. Severe 4+ mitral regurgitation with left atrial enlargement and moderate  pulmonary hypertension.  2. Moderate pulmonary hypertension with pulmonary artery systolic pressure     approximately 50 mmHg.  3. No significant coronary artery disease.  4. Preserved left ventricular systolic contractility with ejection fraction     at least 55%.   PLAN:  Dental consult. Potential valve surgery by Dr. Tyrone Sage.                                                 Lesleigh Noe, M.D.    HWS/MEDQ  D:  04/21/2002  T:  04/22/2002  Job:  [id number]   cc:   Jeannett Senior A. Evlyn Kanner, M.D.   Gwenith Daily Tyrone Sage, M.D.

## 2011-01-13 NOTE — Discharge Summary (Signed)
NAMEJAKEOB, TULLIS               ACCOUNT NO.:  [id number]   MEDICAL RECORD NO.:  [id number]          PATIENT TYPE:  OIB   LOCATION:  6524                         FACILITY:  [facility]   PHYSICIAN:  Francisca December, M.D.  DATE OF BIRTH:  May 17, 1915   DATE OF ADMISSION:  01/08/2006  DATE OF DISCHARGE:  01/09/2006                                 DISCHARGE SUMMARY   DISCHARGE DIAGNOSES:  1.  Tachybrady syndrome status post single chamber pacemaker, Medtronic, on      Jan 08, 2006 by Dr. Corliss Marcus.  2.  Chronic atrial fibrillation.  3.  Long-term Coumadin therapy.  4.  Status post mitral valve repair and tricuspid annular ring, October      2003.  5.  History of right cerebrovascular accident with left hemiparesis.  6.  Hypothyroidism, treated.  7.  Hypertension history.  8.  Remote history of bleeding duodenal ulcer.  9.  History of colon resection.  10. Long-term medication use.   Mr. Qazi is an 77 year old male who has chronic atrial fibrillation.  In  April of this year, he began having dizzy spells and a Holter monitor showed  a tachybrady type syndrome with rapid ventricular response, as well as  positive up to 3 seconds in duration.  The patient was then scheduled for  permanent pacemaker implantation.   On Jan 08, 2006, the patient was brought into the hospital and underwent a  single chamber pacemaker implant utilizing a Medtronic device under the care  of Dr. Corliss Marcus.  The patient tolerated the procedure well and remained  in the hospital overnight, and the following day chest x-ray was normal  showing no pneumothorax.  The patient was discharged to home in stable  condition on the following medications:  1.  Coumadin, as prior to admission beginning on Jan 10, 2006.  2.  He is to immediately restart folic acid daily.  3.  Synthroid 100 mcg daily.  4.  Omeprazole daily.  5.  Cod liver oil daily.  6.  Propoxyphene 100 mg, 2 tablets every 4-6 hours as needed.   I  have given him a discharge instruction form regarding activity and wound  care instructions.  He is to follow up with Dr. Amil Amen on Jan 18, 2006 at 9  a.m.  He is to follow up in the Coumadin clinic on Jan 15, 2006 at 3:45 p.m.  He is to call for any questions or concerns.      Guy Franco, P.A.      Francisca December, M.D.  Electronically Signed    LB/MEDQ  D:  01/09/2006  T:  01/09/2006  Job:  [id number]   cc:   Lyn Records, M.D.  Fax: [phone]

## 2011-01-13 NOTE — Procedures (Signed)
[place]. [facility]  Patient:    [name]                         MRN: [id number] Proc. Date: 09/12/99 Adm. Date:  [id number] Attending:  Louie Bun CC:         Tera Mater Evlyn Kanner, M.D.             John C. Madilyn Fireman, M.D.                           Procedure Report  PROCEDURE:  Upper endoscopy with control of hemorrhage and biopsies.  INDICATION:  An 77 year old gentleman who is two months status post an upper GI  bleed from a duodenal ulcer and who presented with melanic stools today while on Plavix.  FINDINGS:  Active bleeding in the duodenal bulb with cessation of bleeding following injection with epinephrine.  INFORMED CONSENT:  The nature, purpose, and risks of the procedure had been discussed with the patient who provided written consent.  DESCRIPTION OF PROCEDURE:  Antibiotic prophylaxis with gentamicin 80 mg was administered prior to the procedure and with ampicillin 1 gm during and following the procedure because of history of mitral valvular disease.  Sedation was Versed 2.5 mg IV without arrhythmias or desaturation.  The procedure was done at the bedside in the emergency room.  The Olympus video  endoscope was passed under direct vision.  The vocal cords were not well seen but the esophagus was quite easily entered and it was normal in its entirety without evidence of any Mallory-Weiss tear, reflux, esophagitis, varices, infection, or  neoplasia.  There was a 2 cm hiatal hernia present.  The stomach was entered.  It contained some old dark "motor oil" material in the proximal stomach, but in the prepyloric region and also in the duodenal bulb, there was a fair amount of clotted fresh red blood.  It was also noted that a small amount of fresh liquid red blood was welling up in the duodenal bulb as confirmed by repeated irrigations.  I did not see an actual ulcer, because the overlying clotted blood was adherent and could not  readily be washed away.  No lesions were seen in the second duodenum, pyloric channel, or in the stomach including retroflex view of the proximal stomach.  A sclerotherapy needle was used to inject approximately 2.5 cc of epinephrine into the area underlying the clot in a blind fashion, but after doing so, there appeared to be complete hemostasis in that no fresh liquid red blood was observed to be welling up, even when I checked by irrigating with water.  Antral biopsies were obtained to look for evidence of H. pylori both for pathology and CLOtesting.  The patient tolerated the procedure well and there were no apparent complications.  IMPRESSION: 1. Active bleeding from duodenal bulb, presumably a recurrent ulcer. 2. Hemostasis achieved following epinephrine injection. 3. It is felt that the esophagus, stomach, and second duodenum were adequately    evaluated and were free of any additional sources of bleeding.  PLAN:  GI bleeding management to include antipeptic therapy monitoring of hemoglobin level, fluid support, and transfusion support as needed.  Await pathology on todays biopsy specimens. DD:  09/12/99 TD:  09/12/99 Job: [id number]

## 2011-01-13 NOTE — Procedures (Signed)
[place]. [facility]  Patient:    [name]                         MRN: [id number] Proc. Date: 07/23/99 Adm. Date:  [id number] Attending:  Beatris Ship CC:         Petra Kuba, M.D.             Richard A. Jacky Kindle, M.D.             John C. Madilyn Fireman, M.D.                           Procedure Report  PROCEDURE:  Esophagogastroduodenoscopy with injection therapy.  INDICATION:  The patient with upper GI bleeding.  PREMEDICATION:  Demerol 20 mg and Versed 3.5 mg.  INFORMED CONSENT:  Consent was signed after risks, benefits, methods, and options thoroughly discussed with Mr. Reuter prior to any premedications given.  DESCRIPTION OF PROCEDURE:  Video endoscope was inserted by direct vision.  The esophagus was normal.  In the distal esophagus was a moderate-sized hiatal hernia with a benign-appearing widely patent ring.  The scope was inserted into the stomach where some severe gastritis was seen and a small amount of coffee ground was suctioned.  In the antrum was a small amount of brighter blood which was easily washed and suctioned away.  The scope was inserted into the duodenal bulb where a fresh clot was seen with an ulcer being able to be detected underneath it.  With washing, there was a slight bit of bright red blood oozing from around it.  The scope was quickly inserted into the second portion of the duodenum which was normal.  No active bleeding was seen there.  The scope was withdrawn back to the ulcer and it was washed and then watched with continued oozing.  We could not wash the clot away.  The scope was withdrawn back into the stomach and the water was suctioned.  The  stomach was evaluated on straight and retroflex visualization.  Other than the severe gastritis, no additional findings were seen with a good look at the cardia, fundus, lesser, greater curve, and angularis.  The scope was then reinserted into the duodenal  bulb and injecting around the clot. A total of approximately eight injections were done with a total of 0.5 to 1 cc  injected.  We did do about six injections and then washed and watched the ulcer and elected to proceed with two more injections afterward despite probable adequate  hemostasis.  Again, after the injections were completed, the ulcer was washed and watched and periodically the scope was withdrawn back into the stomach and the water was suctioned.  No active bleeding was seen.  On relooking one more time, we elected to stop.  Air and water was suctioned. Scope was removed again and a good look at the esophagus on slow withdrawal confirmed the above findings.  The scope was removed.  The patient tolerated the procedure well.   There was no obvious immediate complication.  DIAGNOSES: 1. Moderate hiatal hernia with benign-appearing widely patent ring. 2. Severe gastritis. 3. Duodenal ulcer with a fresh clot and minimal oozing status post injection with    approximately 6 cc of 1:10,000 epinephrine.  A total of about eight injections. 4. Otherwise within normal limits esophagogastroduodenoscopy.  PLAN:  Observe for rebleeding, follow labs.  Can decrease hemoglobin in q.8h.  Follow-up BUNs and PT and recheck EGD p.r.n.  I have discussed the above with his brother who understands.  We will need to decide C&S and heart preventive medicines in the future but hold nonsteroidals long-term and continue Prevacid. DD:  07/23/99 TD:  07/23/99 Job: [id number]

## 2011-01-13 NOTE — Op Note (Signed)
NAME:  [name], [name]               ACCOUNT NO.:  [id number]   MEDICAL RECORD NO.:  [id number]          PATIENT TYPE:  AMB   LOCATION:  ENDO                         FACILITY:  [facility]   PHYSICIAN:  John C. Madilyn Fireman, M.D.    DATE OF BIRTH:  1914-10-05   DATE OF PROCEDURE:  10/19/2004  DATE OF DISCHARGE:                                 OPERATIVE REPORT   PROCEDURE:  Colonoscopy with polypectomy.   INDICATIONS FOR PROCEDURE:  History of colon cancer with last colonoscopy 5  years ago.   PROCEDURE:  The patient was placed in the left lateral decubitus position  and placed on the pulse monitor with continuous low-flow oxygen delivered by  nasal cannula. He was sedated with 50 mcg IV fentanyl and 4 milligrams IV  Versed. Olympus video colonoscope was inserted into the rectum and advanced  to cecum, confirmed by transillumination of McBurney's point and  visualization of ileocecal valve and appendiceal orifice. Prep was good. The  cecum appeared normal with no masses, polyps, diverticula or other mucosal  abnormalities. In the ascending colon, there was an 8 mm polyp that was  removed by snare.  This was sessile and removed in one piece.  The remainder  of the ascending, transverse, descending, and sigmoid colon appeared normal.  The surgical anastomosis from previous surgery was seen at 15 cm and  appeared intact with no stenosis or suggestion of neoplasm. The rectum  distal to the anastomosis appeared normal. Retroflexed view of the anus  revealed no obvious internal hemorrhoids. Scope was then withdrawn, and the  patient returned to the recovery room in stable condition. He tolerated the  procedure well. There were no immediate complications.   IMPRESSION:  Ascending colon polyp, otherwise normal study with evidence of  previous surgical resection.   PLAN:  Will await histology and probably the patient will not need future  colon cancer surveillance due to his advanced age.      JCH/MEDQ  D:  10/19/2004  T:  10/19/2004  Job:  [id number]   cc:   Jeannett Senior A. Evlyn Kanner, M.D.  [address]  [place]  [place] [address]  Fax: [phone]

## 2011-01-13 NOTE — Op Note (Signed)
   NAME:  [name], [name] NO.:  [id number]   MEDICAL RECORD NO.:  [id number]                   PATIENT TYPE:  INP   LOCATION:  2312                                 FACILITY:  [facility]   PHYSICIAN:  Quita Skye. Krista Blue, M.D.               DATE OF BIRTH:  February 25, 1915   DATE OF PROCEDURE:  DATE OF DISCHARGE:                                 OPERATIVE REPORT   DIAGNOSIS:  Mitral valve regurgitation.   HISTORY OF PRESENT ILLNESS:  The patient is an 77 year-old white male who  presents to the operating room for repair of his mitral valve.  The surgeon,  Dr. Tyrone Sage requested TEE for intraoperative management of this patient.   PROCEDURE:  Following a routine induction the transesophageal probe was  lubricated and carefully inserted into the patient's esophagus for cardiac  imaging. Overall images of the heart demonstrated no evidence of effusion.  Overall the heart was generous in size.  The right atrium was large with no  evidence of masses or thrombus.  The inner-atrial septum was evaluated and  there was no evidence for atrial septal defect.  The tricuspid valve was  evaluated which showed some flail of the septal leaflet.  There was moderate  regurgitation as well.  The right ventricle had good contractility and,  although moderately enlarged, there was no evidence for masses or thrombus.  The left atrium was also enlarged with no evidence of masses or thrombus.  The appendage also appeared to be clear.  The mitral valve showed evidence  of a flailed posterior leaflet.  The section of that valve were seen  prolapsing into the atrium.  There was significant severe regurgitation of  the valve.  The left ventricle had good contractility and no evidence of  segmental wall motion abnormality. The aortic valve had three leaflets.  There was no stenosis or regurgitation noted of the aortic valve.  The  patient then underwent valvuloplasty with a ring of the tricuspid  valve and  a mitral valve repair.  Following these repairs the heart was evaluated  again which showed significantly reduced mitral regurgitation 1+ with a  small jet traveling laterally towards the inner-atrial septum. The tricuspid  valve had trace regurgitation following the repair.  The patient was  separated from bypass successfully and was taken to the intensive care unit  after the transesophageal probe was removed and the patient was in stable  condition.                                               Quita Skye Krista Blue, M.D.    JDS/MEDQ  D:  06/18/2002  T:  06/18/2002  Job:  [id number]

## 2011-01-13 NOTE — Discharge Summary (Signed)
[place]  Patient:    [name]                         MRN: [id number] Adm. Date:  [id number] Disc. Date: [id number] Attending:  Louie Bun CC:         Tera Mater Evlyn Kanner, M.D.                           Discharge Summary  HISTORY OF ILLNESS:  The patient is a 77 year old white male who was admitted for melanic stools, and exertional dyspnea, consistent with GI bleeding.  He was on  Plavix and had been admitted two months earlier with a significant upper GI bleed, with a duodenal source, presumably a small ulcer.  For details, please see admission history and physical.  COURSE IN HOSPITAL:  The patient was admitted by Dr. Matthias Hughs and underwent upper endoscopy, which showed an actively oozing area of the duodenal bulb, with an ulcer not clearly visible, but the bleeding stopped after injection of epinephrine. He was admitted to a monitored bed, and a CLOtest was obtained.  The patients Plavix was held.  The initial hemoglobin was 9.0, and the BUN was 44.  His hemoglobin ell to 7.6 and he was given one unit of packed red blood cells, with a subsequent rise to 9.0.  His CLOtest was negative.  The stools remained dark, but decreased in volume.  He was treated with Prevacid 30 mg b.i.d.  His diet was advanced and his hemoglobin remained stable.  He was given one more unit of packed red blood cells for a hemoglobin of 8.6, and on the day of discharge, his hemoglobin is 9.8. He was tolerating a regular diet at that point, and had no abdominal complaints. e was ambulatory.  He was discharged to outpatient follow-up with Dr. Madilyn Fireman.  DISCHARGE DIAGNOSES: 1. Duodenal ulcer with bleed. 2. History of colon cancer. 3. History of paroxysmal atrial fibrillation. 4. History of mitral valve prolapse. 5. History of cerebrovascular accident.  DISCHARGE MEDICATIONS: 1. Altace 1.25 mg a day. 2. Lanoxin 0.125 mg once a day. 3. Prevacid 30 mg  b.i.d. 4. Plavix is on hold.  FOLLOW-UP:  Dr. Madilyn Fireman in 2-3 weeks. DD:  10/18/99 TD:  10/19/99 Job: [id number]

## 2011-01-13 NOTE — Discharge Summary (Signed)
   NAMEHONEST, VANLEER NO.:  [id number]   MEDICAL RECORD NO.:  [id number]                   PATIENT TYPE:   LOCATION:                                       FACILITY:  [facility]   PHYSICIAN:  Edward B. Tyrone Sage, M.D.            DATE OF BIRTH:  05-Jan-1915   DATE OF ADMISSION:  06/18/2002  DATE OF DISCHARGE:  06/26/2002                                 DISCHARGE SUMMARY   ADDENDUM:  The patient was originally scheduled for discharge home on  June 25, 2002.  On the morning of June 25, 2002 he was evaluated and  was still somewhat weak.  He also was still in the process of being loaded  on Coumadin therefore, it was felt necessary to keep him inhouse for an  additional day for evaluation.  By June 26, 2002 his INR was therapeutic  at 2.1 with a PT of 21.3.  He had remained in normal sinus rhythm and was  otherwise doing well.  It was felt at that time he was ready for discharge  home.   AMENDED LIST OF MEDICATIONS:  1. Coumadin 5 mg to alternate with 2.5 mg every other day.  2. Amiodarone 100 mg q.d.  3. Synthroid 75 mcg q.d.  4. Folic acid 1 mg q.d.  5. Lasix 20 mg q.d.  6. Tylox one to two q.4h. p.r.n. for pain.  7. Protonix 40 mg q.d.   ADDITIONAL DISCHARGE INSTRUCTIONS:  Home health nursing will be arranged to  draw a PT and INR and call the results to Dr. Michaelle Copas office on Monday,  June 30, 2002 for further Coumadin dosing.  All other discharge  instructions are as previously dictated.   FINAL DIAGNOSES:  1. Mitral regurgitation.  2. Tricuspid regurgitation.  3. History of congestive heart failure.  4. Chronic atrial fibrillation.  5. History of right cerebrovascular accident with left hemiparesis.  6. History of colon resection.  7. Hypothyroidism.  8.     Hypertension.  9. History of pneumonia.  10.      History of bleeding duodenal ulcer.     Coral Ceo, P.A.                        Gwenith Daily Tyrone Sage, M.D.    GC/MEDQ   D:  07/24/2002  T:  07/25/2002  Job:  [id number]   cc:   Jeannett Senior A. Evlyn Kanner, M.D.  [address]  [place]  [place] [address]  Fax: [phone]   Lyn Records III, M.D.  [address]  [address]  [place]  [place] [address]  Fax: [phone]

## 2011-01-13 NOTE — Discharge Summary (Signed)
NAME:  Tony, [name] NO.:  [id number]   MEDICAL RECORD NO.:  [id number]                   PATIENT TYPE:  INP   LOCATION:  2016                                 FACILITY:  [facility]   PHYSICIAN:  Gwenith Daily. Tyrone Sage, M.D.            DATE OF BIRTH:  February 08, 1915   DATE OF ADMISSION:  06/18/2002  DATE OF DISCHARGE:  06/25/2002                                 DISCHARGE SUMMARY   PRIMARY ADMITTING DIAGNOSES:  1. Mitral regurgitation.  2. Tricuspid regurgitation.   ADDITIONAL DIAGNOSES:  1. History of congestive heart failure.  2. Chronic atrial fibrillation.  3. History of right cerebrovascular accident with left hemiparesis.  4. History of colon resection.  5. Hypothyroidism.  6. Hypertension.  7. History of pneumonia.  8. History of bleeding duodenal ulcer.   PROCEDURES PERFORMED:  1. Mitral valve repair with 30 mm Seguin high anuloplasty ring.  2. Tricuspid anuloplasty.  3. Ligation of left atrial appendage.   HISTORY:  The patient is an 77 year old male with a history of mitral  regurgitation.  He has been followed for some time now but wanted to wait  until later in the year prior to proceeding with any kind of surgical  intervention.  He has had ongoing progression of his symptoms of shortness  of breath for about 10 years now.  A 2-D echo in September 2003 showed  severe 4+ mitral regurgitation and moderate tricuspid regurgitation.  This  was confirmed by cardiac catheterization.  He was also noted at that time to  have an ejection fraction of 65% with left main 20% stenosis and 30%  stenosis of the LAD.  He was seen by Dr. [name] and it was felt  that he would be a good candidate for surgical repair of his valves at this  time.   HOSPITAL COURSE:  He was admitted on 10/22 and taken to the operating room  where he underwent the above noted procedures.  He tolerated those well and  was transferred to SICU in stable condition.  He is  extubated shortly after  surgery.  He was initially atrial paste postoperatively.  He is started on  IV Amiodarone as well.  He is slowly weaned from the pacer in a slow nodal  rhythm.  He was restarted on the atrial pacer.  He was kept in ICU for  further observation as well.  He was started on Coumadin on postop day 2.  He remained in ICU and his pacer was slowly weaned and discontinued.  By  postop day 5 he was maintained in sinus rhythm with rates around 60.  At  that time he was transferred to the floor.  He has continued to maintain  sinus rhythm with rates in the 60s to 70s without evidence of significant  ectopy.  Otherwise he has done well postoperatively.  He has been ambulating  in the halls without difficulty.  He is slowly off supplemental oxygen and  is maintaining O2 saturations of greater than 95% on room air.  His labs  have remained stable.  Hemoglobin is 9.0 and hematocrit 27.2 on postop day  6.  His INR is therapeutic at 2.0.  His incisions are healing well.  He is  tolerating regular diet and having normal bowel and bladder function.  It is  felt if he continues to remain stable he will be ready for discharge home on  06/25/02.    DISCHARGE MEDICATIONS:  1. Enteric coated aspirin 325 mg q.d.  2. Amiodarone 200 mg q.d.  3. Synthroid 75 micrograms q.d.  4. Folic acid 1 mg q.d.  5. Lasix 20 mg q.d.  6. Tylox 1 to 2 q.4h. p.r.n. for pain.  7. Coumadin dose for home to be determined by PT and INR to be drawn on the     morning of discharge.   DISCHARGE INSTRUCTIONS:  He is to refrain from driving, heavy lifting or  strenuous activity.  He may continue low-fat, low-sodium diet.  He is asked  to shower daily and clean his incisions with soap and water.   DISCHARGE FOLLOWUP:  He will see Dr. [name] in the office on Friday  November 14.  He will have a chest x-ray prior to this appointment.  He will  then see Dr. Tyrone Sage on Thursday December 4 and is asked to  bring his chest  x-ray to this appointment.  He will also followup on Friday with Dr. Michaelle Copas  office to have PT and INR drawn for further management of his Coumadin.  He  is asked to call our office if he experiences any redness, swelling or  increased drainage from his incision sites, fever greater than 101, chest  pain or shortness of breath symptoms.     Coral Ceo, PA                          Gwenith Daily. Tyrone Sage, M.D.    GC/MEDQ  D:  06/24/2002  T:  06/25/2002  Job:  [id number]   cc:   Jeannett Senior A. Evlyn Kanner, M.D.  [address]  [place]  [place] [address]  Fax: [phone]   Lyn Records III, M.D.  [address]  [address]  [place]  [place] [address]  Fax: [phone]

## 2011-01-13 NOTE — H&P (Signed)
NAME:  [name], [name]                         ACCOUNT NO.:  [id number]   MEDICAL RECORD NO.:  [id number]                   PATIENT TYPE:  INP   LOCATION:  NA                                   FACILITY:  [facility]   PHYSICIAN:  Edward B. Tyrone Sage, M.D.            DATE OF BIRTH:  July 23, 1915   DATE OF ADMISSION:  06/18/2002  DATE OF DISCHARGE:                                HISTORY & PHYSICAL   </REFERRING PHYSICIANS  Lyn Records, M.D. and Tera Mater. [place], M.D.   CHIEF COMPLAINT:  Plus four mitral regurgitation with left atrial  enlargement and pulmonary hypertension.   HISTORY OF PRESENT ILLNESS:  The patient is an 77 year old white male, +4  MR, and moderate pulmonary hypertension.  A 2-D echo obtained in September  2003 showed severe MR and moderate tricuspid regurgitation.  Cardiac  catheterization on April 26, 2002 showed +4 MR and moderate pulmonary  hypertension, ejection fraction was 65%.  Left main showed a 20% irregular  stenosis.  The LAD had a 30% stenosis.  The circumflex and the RCA were  normal.  He was referred to CVTS and Dr. Ofilia Neas in August for mitral  valve replacement.  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  The patient is admitted now for elective mitral valve  regurgitation.  He first noted his symptoms of congestive heart failure back  in 1991.  These were initially treated with diuretics with good results, but  he has had ongoing progression of his disease and is currently ready for  mitral valve replacement.   PAST MEDICAL HISTORY:  1. Congestive heart failure, dating back to 1991.  2. Chronic atrial fibrillation.  3. History of right CVA with left hemiparesis three years ago.  4. History of colon resection in 1993 and again in 1995.  5. History of hypothyroidism.  6. History of hypertension.  7. History of pneumonia in 1929.  8. History of  bleeding duodenal ulcer approximately three years ago.   PAST SURGICAL HISTORY:  1. Colon resection secondary to cancer.  2. 1993 and 1995, bilateral cataract surgeries.  3. History of hernia repair in 1996.   REVIEW OF SYSTEMS:  Essentially negative except for his left hand is still  numb.  Left leg is stiff.  He has a facial droop on the left.  He also has  history of dumping syndrome whenever he has a meal after two elective colon  resections.  He also notes his feet still feel numb.  CARDIAC:  He has no  history of any other problems.  No history of MI, no history of  pulmonary  embolus.  No history of DVT.  He has a positive history of GI bleed.  No  history of dysuria or hematuria, gastroesophageal reflux disease, or  congestive heart failure.  He has positive history of hypertension,  shortness of breath, with dyspnea on exertion.  He denied PND and orthopnea.   FAMILY HISTORY:  Mother died at age 17 with a stroke and phlebitis.  Father  deceased at age 41 with cancer of the stomach.  He has six sisters, two with  strokes.  There are four brothers, one with a history of small bowel  obstruction.  The patient has been married 63 years.  They have two children  in good health.  He is retired Production designer, theatre/television/film at [employer].   SOCIAL HISTORY:  Negative.   PHYSICAL EXAMINATION:  GENERAL:  This is a well-nourished, tall, thin white  male in no acute distress.  VITAL SIGNS:  Blood pressure is 108/58 in the right arm, 150/70 in the left.  Pulse was 76 and regular.  Respirations were 18 and  unlabored.  HEENT:  Normocephalic.  Eyes:  PERRLA, EOMs intact.  Fundi not visualized.  Ears, nose, throat, mouth grossly within normal limits.  NECK:  Supple, no lymphadenopathy, no thyromegaly.  CARDIAC:  There is a 2/6 systolic murmur best heard at the left sternal  border.  EXTREMITIES:  No clubbing, cyanosis, or edema.  ABDOMEN:  Soft, nontender, positive bowel sounds.  No hepatosplenomegaly.   EXTREMITIES:  No clubbing, cyanosis or edema.  RECTUM:  Deferred.  SKIN:  No ulcerations.  NEUROLOGIC:  Grossly within normal limits .   IMPRESSION:  1. Plus four mitral regurgitation with left ventricular hypertrophy and     pulmonary hypertension.  2. History of congestive heart failure with chronic atrial fibrillation     treated with Plavix.  3. History of hypertension.  4. Hypothyroidism.  5. History of cerebrovascular accident with mild left-sided weakness.  6. History of gastrointestinal bleed/ulcer.  7. History of gastrointestinal bleed.   PLAN:  The patient is scheduled for placement of a permanent mitral valve in  the a.m., 06/18/02.         Eber Hong, P.A.                 Gwenith Daily Tyrone Sage, M.D.    WDJ/MEDQ  D:  06/16/2002  T:  06/16/2002  Job:  [id number]

## 2011-01-13 NOTE — H&P (Signed)
[place]. [facility]  Patient:    [name]                         MRN: [id number] Adm. Date:  [id number] Attending:  Beatris Ship CC:         Petra Kuba, M.D.                         History and Physical  CHIEF COMPLAINT:  Rectal bleeding and weakness.  HISTORY OF PRESENT ILLNESS:  [name] is an 77 year old white male with a recent right basal ganglia bleed, mitral valve prolapse with flailed leaflet, atrial fibrillation, congestive heart failure, colon cancer, and essential hypertension, presenting at this time with rectal bleeding and weakness.  He was hospitalized in August at [facility], initially inpatient and subsequently transferred to rehabilitation.  He entered with left-sided weakness, paresthesias and tingling at which point he was on Coumadin for valvular heart disease in atrial fibrillation. He was seen by neurology and no further Coumadin was decided upon, therefore converted to antiplatelet agents alone, rehabilitation and eventually discharged. He has been stable from a cardiopulmonary standpoint.  His only GI history includes adenocarcinoma of the colon, status post colectomy in 1995, due for a repeat colonoscopy this year.  He has been living with his wife, doing fairly well and he relates at least 24 hours of mild progression of dyspnea upon exertion and mild  weakness.  This evening, he was noted to have maroon stools and presented to the emergency room for evaluation.  When accessing orthostatics in the emergency room, he had one episode of coffee-grounds emesis but had no other vomiting post or preceding.  He denies any abdominal pain and has had several maroon stools while in the emergency room.  He has had no chest pain but does relate some dyspnea on exertion; he has had no orthopnea or PND.  ALLERGIES:  The patient has no known drug allergies.  MEDICATIONS: 1. Accupril 10 mg p.o. q.d. 2. Lanoxin  0.125 p.o. q.d. 3. ______ ER 200 p.o. q.d. 4. Plavix 75 p.o. q.d. 5. Aspirin 81 daily.  PAST MEDICAL HISTORY:  Adenocarcinoma of the colon, 1995, mitral valve prolapse wit flailed leaflet, atrial fibrillation, congestive heart failure.  PAST SURGICAL HISTORY:  Bilateral cataracts, hernia, adenocarcinoma with colon resection.  FAMILY HISTORY:  Father deceased with bladder cancer.  Mother deceased with CVA.  SOCIAL HISTORY:  The patient does not smoke nor drink.  He does reside with his  wife.  He is accompanied tonight by wife and brother.  PHYSICAL EXAMINATION:  VITAL SIGNS:  Temperature 98, blood pressure supine 140/80, sitting 110/60, pulse 80 irregular supine, sitting 90 irregular, respiratory rate 18 unlabored, supine. The patient is pale, in no distress.  SKIN:  Warm, nondiaphoretic  HEENT:  Bitemporal wasting, anicteric.  Extraocular movements intact. Oropharynx benign.  No dried blood.  NECK:  Supple.  No JVD.  Carotid 2+/4+.  No bruits.  LUNGS:  Clear bilaterally.  CARDIOVASCULAR:  Irregular rhythm.  Grade 2/6 systolic ejection murmur lower left sternal border.  ABDOMEN:  Soft, good breath sounds.  No hepatosplenomegaly.  Well-healed midline scar.  EXTREMITIES:  Reveal no clubbing, cyanosis, or edema.  Warm distally.  No mottling. Intact pulses.  Calves soft.  Joints normal.  NEUROLOGICAL:  He does have a left hemiparesis.  Alert, awake, conversant. Knows self, place and day, but confused regarding circumstances.  RECTAL:  Examination reveals maroon stool.  ASSESSMENT: 1. Probable upper gastrointestinal bleed supported by repeated of drop in    hematocrit, orthostatics, elevated BUN and maroon stools/coffee-grounds    emesis.  I suspect Plavix, aspirin, ______ are all contributory.  He is    hemodynamically stable but orthostatic. 2. Adenocarcinoma of the colon, status post resection in 1995.  Due for a repeat    colonoscopy in December. 3.  Atrial fibrillation, stable. 4. Congestive heart failure, stable. 5. Mitral valve prolapse with flail leaflets, stable. 6. Right basal ganglia infarct/bleed/August 2000 with left hemiparesis.  PLAN:  The patient is admitted for transfusion, monitoring, probable upper GI endoscopy and/or colonoscopy in further pending his course.  All antiplatelet agents and nonsteroidals will be held at this time, and the family understands he risk of stroke given these circumstances. DD:  07/23/99 TD:  07/23/99 Job: [id number]

## 2011-01-13 NOTE — Consult Note (Signed)
NAME:  [name], [name] NO.:  [id number]   MEDICAL RECORD NO.:  [id number]                   PATIENT TYPE:  OIB   LOCATION:  2899                                 FACILITY:  [facility]   PHYSICIAN:  [name], D.D.S.          DATE OF BIRTH:  August 30, 1914   DATE OF CONSULTATION:  04/21/2002  DATE OF DISCHARGE:  04/21/2002                              DENTISTRY CONSULTATION   The patient is an 77 year old white male referred by Dr. [name] for  a dental consultation.  Patient with known severe mitral regurgitation with  anticipated mitral valve replacement/repair of heart surgery in the future.  Patient currently undergoing cardiac catheterization with Dr. [name].  A dental consultation was requested to rule out dental infection which may  effect the patient's systemic health and anticipated heart valve surgery.   MEDICAL HISTORY:  1. Severe mitral regurgitation.     A. Anticipated mitral valve replacement/repair heart surgery with Dr.        [name] in the future.     B. Status post echocardiogram on 01/27/2002 which revealed severe mitral        regurgitation and moderate tricuspid regurgitation.     C. Status post cardiac catheterization on 04/21/2002 with Dr. [name]        which revealed severe mitral regurgitation and normal coronary        arteries.  Final report is pending.  2. Congestive heart failure.  3. Chronic atrial fibrillation.  4. Status post right stroke with a history of left hemiparesis.  5. Currently on Plavix therapy.  6. History of colon cancer.     A. Status post resection in 1993.     B. Status post second cancer resection in 1995.  7. Hypothyroidism currently on Synthroid.  8. History of hypertension.  9. Status post bilateral cataract surgeries.  10.      History of bleeding duodenal ulcers in November 2000 and January     2001.  11.      Status post hernia repair surgery in 1996.   ALLERGIES/ADVERSE DRUG REACTIONS:  None known.   MEDICATIONS:  1. Plavix 75 mg daily.  2. Diovan 80 mg daily.  3. Synthroid 0.1 mg taking 1/2 tablet daily.  4. Multivitamin daily.  5. Cod liver oil as directed.  6. Garlic capsules as directed.  7. Furosemide 20 mg daily.   SOCIAL HISTORY:  The patient is married to [place] and lives with his wife.  The patient does not smoke or drink.   FAMILY HISTORY:  Father with a history of bladder cancer.  Mother died from  a stroke.   FUNCTION ASSESSMENT:  The patient remains independent for ADLs.  The patient  continues to work in a one acre garden at this time.   REVIEW OF SYSTEMS:  This was reviewed with the patient.   DENTAL HISTORY:  CHIEF COMPLAINT:  Patient with  anticipated mitral valve  replacement heart surgery.  Patient now seen as a part of a pre heart valve  surgery dental protocol to rule out dental infection which may effect the  patient's systemic health and future heart valve surgery.  HISTORY OF PRESENT ILLNESS:  Patient with known severe mitral regurgitation.  Patient with anticipated mitral valve replacement heart surgery in the  future.  Patient is now seen as part of a dental consultation protocol to  rule out dental infection which may effect the anticipated heart valve  surgery.   Patient currently denies toothaches, swellings or abscesses.  The patient  was seen approximately one year ago to have a cap placed after root canal  therapy.  The patient indicates that he sees Dr. [name] in [place] on a  periodic basis.  The patient is unsure of the last time he had a dental  cleaning.  The patient denies the presence of denture at this time.  The  patient gives a questionable history of jaw fracture, remote.   DENTAL EXAM:  GENERAL:  The patient is a well-developed, well-nourished  white male in no acute distress.  HEAD AND NECK:  There is no palpable lymphadenopathy.  There is no acute TMJ  symptoms.  The patient  does give a history of previous jaw fracture by  report.  This was remote.  INTRAORAL EXAM:  The patient has normal saliva.  There is no evidence of  abscess formation.  PERIODONTAL:  Patient with some plaque accumulations.  Patient with  relatively good oral hygiene.  The patient does have chronic periodontitis  with incipient bone loss.  There is evidence of gingival resection.  DENTITION:  Patient with multiple missing teeth.  I would need a full series  of dental radiographs to identify the exact tooth numbers present/missing.  DENTAL CARIES:  There are no obvious dental caries noted at this time.  I  would need a full series of periapical radiographs to rule out dental  caries.  ENDODONTIC:  The patient currently denies acute toothache symptoms.  The  patient has had previous root canal therapies.  The patient denies symptoms  associated with the teeth that had previous root canal therapies, numbers 29  and 30.  CROWN AND BRIDGE:  Patient with multiple crown and bridge restorations.  They appear to be clinically acceptable at this time.  PROSTHODONTIC:  The patient denies the presence of partial dentures at this  time.  OCCLUSION:  Patient with a stable occlusion.  Patient does have a poor  occlusive scheme secondary to multiple missing teeth, supereruption and  drifting of the interposed into the edentulous areas, and lack of  replacement of all missing teeth with clinically acceptable dental  restorations.   RADIOGRAPH INTERPRETATION:  A panoramic x-ray was taken on 04/21/2002.  I  did suggest obtaining a full series of dental radiographs to rule out  periapical pathology and dental caries if the patient so indicates.   There are multiple missing teeth.  There is supereruption and drifting of  the interposed teeth into the edentulous areas.  There is incipient to  moderate bone loss.  There are multiple amalgam, resin and crown and bridge restorations noted.  There are no  obvious periapical radiolucencies noted.  There are previous root canal therapies associates with tooth numbers 29 and  30.   ASSESSMENT:  1. Plaque and calculus accumulations.  2. Chronic periodontitis with bone loss.  3. Selective areas of gingival resection.  4. No  obvious tooth mobility.  5. No obvious dental caries.  I would suggest a full series of dental     radiographs to rule out incipient dental caries.  6. Multiple missing teeth.  I would suggest a full series of dental     radiographs to identify the exact tooth numbers present/missing.  7. Supereruption and drifting of the interposed teeth into the edentulous     areas.  8. Lack of replacement of all missing teeth with clinically acceptable     dental restorations.  9. Poor occlusion scheme.  10.      Stable occlusion.  11.      Multiple crown and bridge, resin and amalgam restorations which     appear to be clinically acceptable at this time.  12.      Questionable history of right jaw fracture - remote.  13.      Need for antibiotic premedication prior to invasive dental     procedures.  14.      Need for discontinuation of Plavix therapy prior to invasive dental     procedures as indicated.   PLAN/RECOMMENDATIONS:  1. I discussed the risks, benefits and complications of various treatment     options with the patient in relationship to his medical and dental     conditions.  We also discussed the pertinence to the anticipated mitral     valve replacement heart surgery and risk for subacute bacterial     endocarditis.  We discussed no treatment, periodontal therapy prior to     heart surgery, need for a full series of dental radiographs to rule out     periapical pathology and incipient dental caries, dental restorations,     crown and bridge therapy, implant therapy, root canal therapy, and the     replacement of missing teeth as indicated.  The patient currently wishes     to consider whether he will obtain a  dental cleaning prior to the heart     valve surgery.  If there is adequate time, the patient will follow up     with his regular dentist for a full series of dental radiographs and a     dental cleaning with antibiotic premedication.  If not, the patient may     just proceed with the heart surgery as indicated.  The patient will     reconsult dental medicine if treatment is desired in the dental medicine     clinic.  2. Provision of written and verbal information on heart valves and mouth     care - today.  3. Discussion of findings with Dr. [name] and Dr. [name] as     indicated with coordination of the future heart valve surgery as     indicated.  4. Suggest use of amoxicillin 2.0 g p.o. prior to invasive dental procedure     as part of the subacute bacterial endocarditis antibiotic prophylaxis     regimen.                                               [name], D.D.S.    RFK/MEDQ  D:  04/21/2002  T:  04/23/2002  Job:  [id number]   cc:   Lyn Records III, M.D.  [address] [place]  [place] [address]  Fax: [phone]  Gwenith Daily Tyrone Sage, M.D.

## 2011-01-13 NOTE — Op Note (Signed)
NAMEFAITH, BRANAN               ACCOUNT NO.:  [id number]   MEDICAL RECORD NO.:  [id number]          PATIENT TYPE:  OIB   LOCATION:  2807                         FACILITY:  [facility]   PHYSICIAN:  Francisca December, M.D.  DATE OF BIRTH:  [birth year]   DATE OF PROCEDURE:  01/08/2006  DATE OF DISCHARGE:                                 OPERATIVE REPORT   PROCEDURE PERFORMED:  1.  Left subclavian venogram.  2.  Insertion single chamber permanent transvenous pacemaker.   INDICATIONS:  Mr. Jalon Squier is an 77 year old man who has been  complaining of dizzy spells.  A Holter monitor has revealed chronic atrial  fibrillation with both rapid and slow ventricular response.  There have been  pauses documented up to 3 seconds.  He is to undergo permanent transvenous  pacemaker insertion to allow for medical therapy of tachybrady syndrome  without excessively increasing brady response.   PROCEDURE NOTE:  The patient is brought to cardiac catheterization  laboratory in fasting state.  The left prepectoral region was prepped and  draped in the usual sterile fashion.  Local anesthesia was obtained with  infiltration of 1% lidocaine with epinephrine throughout the left  prepectoral region.  A left subclavian venogram was then performed with a  peripheral injection of 20 mL of Omnipaque.  A digital cine angiogram was  obtained in the AP projection and road mapped to guide future left  subclavian puncture.  The venogram did demonstrate the subclavian vein to be  widely patent and coursing in a normal fashion over the anterior surface of  the first rib and beneath the middle third of the clavicle.  There was no  evidence for persistence of the left superior vena cava.   A 6 to 7 cm incision was then made in the deltopectoral groove and this was  carried down by sharp dissection electrocautery to the prepectoral fascia.  There a plane was lifted and a pocket formed inferiorly and medially using  electrocautery and blunt dissection.  The pocket was then packed with 1%  kanamycin soaked gauze.  The left subclavian vein was then punctured once  with an 18-gauge thin-wall needle through which was passed a 0.038-inch  tight J guidewire.  Over this guidewire a 9-French tearaway sheath and  dilator were advanced.  The dilator was removed and the wire was allowed to  remain in place.  Ventricular lead was advanced to the level of the right  atrium and the sheath was torn away.  A figure-of-eight hemostasis suture  was placed around the entry site of the lead in the pectoralis muscle.  Using standard technique and fluoroscopic landmarks, the lead was  manipulated into the right ventricular apex.  There excellent pacing  parameters were obtained as will be noted below.  The lead was tested for  diaphragmatic pacing at 10 volts and none was found.  The lead was then  sutured into place using three separate 0 silk ligatures.  The kanamycin  soaked gauze and remaining guidewire was removed from the pocket.  The  pocket was copiously irrigated using 1% kanamycin  solution.  The lead was  then attached to the pacing generator carefully tightening it into place and  tested for security.  The lead was then wound beneath the pacing generator  and the generator was placed in the pocket.  The pocket was then inspected  for bleeding and none was found.  The pocket was then closed using 2-0  Vicryl in a running fashion for the subcutaneous layer.  Skin was  approximated using 2-0 Vicryl in a running subcuticular fashion.  Steri-  Strips and sterile dressing were applied and the patient is transported to  the recovery area in stable condition.   EQUIPMENT DATA:  The pacing generator is a Medtronic Adapta model number  [id number], serial number [id number].  The ventricular lead is Medtronic model  number [id number], serial number [id number].   PACING DATA:  The ventricular lead detected a 9.6 millivolt R wave.   The  pacing threshold was 0.5 volts, 0.5 milliseconds pulse width.  The impedance  was 575 ohms resulting in a current at capture threshold of 1.1 MA.      Francisca December, M.D.  Electronically Signed     JHE/MEDQ  D:  01/08/2006  T:  01/09/2006  Job:  [id number]   cc:   Lyn Records, M.D.  Fax: [phone]

## 2011-05-19 LAB — DIFFERENTIAL
Basophils Absolute: 0
Basophils Relative: 1
Lymphocytes Relative: 27
Neutro Abs: 3.4
Neutrophils Relative %: 61

## 2011-05-19 LAB — CBC
MCHC: 33
Platelets: 164
RDW: 15.5

## 2011-05-19 LAB — I-STAT 8, (EC8 V) (CONVERTED LAB)
BUN: 28 — ABNORMAL HIGH
Glucose, Bld: 98
Hemoglobin: 12.9 — ABNORMAL LOW
Potassium: 5.4 — ABNORMAL HIGH
Sodium: 136
TCO2: 25
pH, Ven: 7.379 — ABNORMAL HIGH

## 2011-05-19 LAB — URINALYSIS, ROUTINE W REFLEX MICROSCOPIC
Ketones, ur: NEGATIVE
Nitrite: NEGATIVE
Specific Gravity, Urine: 1.015
Urobilinogen, UA: 1
pH: 7

## 2011-05-19 LAB — BASIC METABOLIC PANEL
CO2: 22
Calcium: 8.8
Chloride: 105
Creatinine, Ser: 1.33
Glucose, Bld: 97

## 2011-05-19 LAB — POCT CARDIAC MARKERS
CKMB, poc: 1.2
Myoglobin, poc: 70.6

## 2011-05-29 LAB — PROTIME-INR
INR: 4.2 — ABNORMAL HIGH
INR: 1.4
INR: 4.3 — ABNORMAL HIGH
INR: >9.8
INR: >9.8
Prothrombin Time: 44.5 — ABNORMAL HIGH
Prothrombin Time: 45 — ABNORMAL HIGH

## 2011-05-29 LAB — CBC
HCT: 34.1 — ABNORMAL LOW
HCT: 29.9 — ABNORMAL LOW
HCT: 30.3 — ABNORMAL LOW
Hemoglobin: 11.4 — ABNORMAL LOW
Hemoglobin: 10.1 — ABNORMAL LOW
Hemoglobin: 10.3 — ABNORMAL LOW
Hemoglobin: 9.6 — ABNORMAL LOW
MCHC: 33.8
MCHC: 34.3
MCV: 92.1
MCV: 93.1
Platelets: 192
RBC: 3.73 — ABNORMAL LOW
RBC: 3.09 — ABNORMAL LOW
RBC: 3.21 — ABNORMAL LOW
RBC: 3.34 — ABNORMAL LOW
RBC: 3.45 — ABNORMAL LOW
RDW: 13.9
RDW: 14.7
RDW: 14.8
WBC: 4.5
WBC: 6.5

## 2011-05-29 LAB — BASIC METABOLIC PANEL
CO2: 24
CO2: 24
Calcium: 8 — ABNORMAL LOW
Calcium: 8.3 — ABNORMAL LOW
Calcium: 8.5
Chloride: 110
GFR calc Af Amer: 47 — ABNORMAL LOW
GFR calc Af Amer: 52 — ABNORMAL LOW
GFR calc Af Amer: 53 — ABNORMAL LOW
GFR calc non Af Amer: 39 — ABNORMAL LOW
GFR calc non Af Amer: 44 — ABNORMAL LOW
Glucose, Bld: 110 — ABNORMAL HIGH
Glucose, Bld: 127 — ABNORMAL HIGH
Potassium: 4.4
Potassium: 4.5
Sodium: 137
Sodium: 138
Sodium: 139

## 2011-05-29 LAB — COMPREHENSIVE METABOLIC PANEL
ALT: 10
ALT: 14
AST: 23
Albumin: 2.8 — ABNORMAL LOW
Alkaline Phosphatase: 46
BUN: 25 — ABNORMAL HIGH
CO2: 23
CO2: 22
Calcium: 8.3 — ABNORMAL LOW
Chloride: 109
Creatinine, Ser: 1.43
GFR calc Af Amer: 56 — ABNORMAL LOW
GFR calc non Af Amer: 48 — ABNORMAL LOW
GFR calc non Af Amer: 46 — ABNORMAL LOW
Glucose, Bld: 111 — ABNORMAL HIGH
Potassium: 4.4
Sodium: 137
Sodium: 138
Total Bilirubin: 0.6
Total Protein: 5.7 — ABNORMAL LOW

## 2011-05-29 LAB — DIFFERENTIAL
Basophils Absolute: 0
Basophils Relative: 0
Eosinophils Absolute: 0
Eosinophils Relative: 1
Lymphocytes Relative: 19
Monocytes Absolute: 0.6
Monocytes Relative: 6
Monocytes Relative: 11
Neutro Abs: 2.9
Neutro Abs: 3.9
Neutrophils Relative %: 72

## 2011-05-29 LAB — URINALYSIS, ROUTINE W REFLEX MICROSCOPIC
Bilirubin Urine: NEGATIVE
Ketones, ur: NEGATIVE
Nitrite: NEGATIVE
Nitrite: NEGATIVE
Protein, ur: NEGATIVE
Specific Gravity, Urine: 1.014
Urobilinogen, UA: 0.2
pH: 7

## 2011-05-29 LAB — PTT FACTOR INHIBITOR (MIXING STUDY)
1 Hr Incub APTT 4:1NP: 47
1 Hr Incub PT 1:1NP: 37

## 2011-05-29 LAB — OCCULT BLOOD X 1 CARD TO LAB, STOOL: Fecal Occult Bld: NEGATIVE

## 2011-05-29 LAB — CK: Total CK: 166

## 2011-05-29 LAB — APTT: aPTT: 108 — ABNORMAL HIGH

## 2011-05-29 LAB — HEPATIC FUNCTION PANEL
ALT: 14
Indirect Bilirubin: 1.1 — ABNORMAL HIGH
Total Protein: 5.2 — ABNORMAL LOW

## 2011-05-29 LAB — PT MIXING STUDY SCREEN: PT Baseline: 45 — ABNORMAL HIGH

## 2011-05-29 LAB — URINE CULTURE: Colony Count: NO GROWTH

## 2011-06-12 LAB — PROTIME-INR
INR: 1.3
Prothrombin Time: 17 — ABNORMAL HIGH

## 2011-06-12 LAB — APTT: aPTT: 31

## 2011-06-12 LAB — CBC
HCT: 34.2 — ABNORMAL LOW
Hemoglobin: 11.4 — ABNORMAL LOW
MCHC: 33.2
MCV: 88
Platelets: 266
RBC: 3.89 — ABNORMAL LOW
RDW: 14.3 — ABNORMAL HIGH
WBC: 5

## 2011-06-12 LAB — COMPREHENSIVE METABOLIC PANEL
Alkaline Phosphatase: 60
BUN: 21
Creatinine, Ser: 1.41
Glucose, Bld: 179 — ABNORMAL HIGH
Potassium: 5.1
Total Bilirubin: 0.7
Total Protein: 7

## 2011-06-14 LAB — I-STAT 8, (EC8 V) (CONVERTED LAB)
BUN: 21
Glucose, Bld: 121 — ABNORMAL HIGH
Hemoglobin: 12.2 — ABNORMAL LOW
Potassium: 4.8
Sodium: 140
pH, Ven: 7.358 — ABNORMAL HIGH

## 2011-06-14 LAB — PROTIME-INR
INR: 2.7 — ABNORMAL HIGH
Prothrombin Time: 29.9 — ABNORMAL HIGH

## 2012-12-18 ENCOUNTER — Encounter: Payer: Self-pay | Admitting: Nurse Practitioner

## 2012-12-18 ENCOUNTER — Non-Acute Institutional Stay (SKILLED_NURSING_FACILITY): Payer: Medicare Other | Admitting: Nurse Practitioner

## 2012-12-18 DIAGNOSIS — R21 Rash and other nonspecific skin eruption: Secondary | ICD-10-CM

## 2012-12-18 DIAGNOSIS — J449 Chronic obstructive pulmonary disease, unspecified: Secondary | ICD-10-CM

## 2012-12-18 DIAGNOSIS — E1149 Type 2 diabetes mellitus with other diabetic neurological complication: Secondary | ICD-10-CM

## 2012-12-18 DIAGNOSIS — G609 Hereditary and idiopathic neuropathy, unspecified: Secondary | ICD-10-CM

## 2012-12-18 DIAGNOSIS — E039 Hypothyroidism, unspecified: Secondary | ICD-10-CM

## 2012-12-18 DIAGNOSIS — K5900 Constipation, unspecified: Secondary | ICD-10-CM

## 2012-12-18 DIAGNOSIS — J4489 Other specified chronic obstructive pulmonary disease: Secondary | ICD-10-CM

## 2012-12-29 ENCOUNTER — Encounter: Payer: Self-pay | Admitting: Nurse Practitioner

## 2012-12-29 DIAGNOSIS — E1142 Type 2 diabetes mellitus with diabetic polyneuropathy: Secondary | ICD-10-CM | POA: Insufficient documentation

## 2012-12-29 DIAGNOSIS — R21 Rash and other nonspecific skin eruption: Secondary | ICD-10-CM | POA: Insufficient documentation

## 2012-12-29 DIAGNOSIS — J449 Chronic obstructive pulmonary disease, unspecified: Secondary | ICD-10-CM | POA: Insufficient documentation

## 2012-12-29 DIAGNOSIS — K5900 Constipation, unspecified: Secondary | ICD-10-CM | POA: Insufficient documentation

## 2012-12-29 DIAGNOSIS — E1149 Type 2 diabetes mellitus with other diabetic neurological complication: Secondary | ICD-10-CM | POA: Insufficient documentation

## 2012-12-29 DIAGNOSIS — E039 Hypothyroidism, unspecified: Secondary | ICD-10-CM | POA: Insufficient documentation

## 2012-12-29 NOTE — Assessment & Plan Note (Signed)
Patient is stable; continue current regimen. Will monitor and make changes as necessary.  

## 2012-12-29 NOTE — Assessment & Plan Note (Signed)
Improved after doxycycline.   

## 2012-12-29 NOTE — Assessment & Plan Note (Signed)
Stable on long term oxygen- will cont to monitor and make changes as necessary

## 2012-12-29 NOTE — Progress Notes (Signed)
Patient ID: [name], male   DOB: March 05, 1915, 79 y.o.   MRN: [id number]  Chief Complaint: medical management of chronic conditions  HPI:  79 year old male seen today for routine follow up- recently finished 30 day course of doxycycline for rash- this has now improved with only a few open abrasions on skin. No signs of infection.  244.9-HYPOTHYROIDISM The hypothyroidism is stable.No complications noted from the medication presently being used. takes synthroid 125 mg daily  250.60-DM, COMP NEURO TYPE II The diabetes remains stable.No complications noted from the medication presently being used. metformin 500mg  daily  356.9-NEUROPATHY, PERIPHERAL The peripheral neuropathy is stable.No complications noted from the medication presently being used. takes neurontin 100 mg twice daily  496-COPD The COPD remains stable.No complications noted from the medication presently being used. is using O2 routinely is taking mucinex twice daily takes atrovent neb treatment four times daily and every 6 hours as needed; takes albuterol neb treatments every 6 hours  564.00-CONSTIPATION The symptoms are stable.The medication is well tolerated. No other therapies have been tried.takes colace twice daily however per nursing he is spitting them out frequently  729.5-PAIN LIMB/ARM/LEG  improved with addition of tramadol 50mg  q 6 hours as needed for pain   Review of Systems:  Review of Systems  Unable to perform ROS: dementia  pt also very HOH    Medications: Patient's Medications  New Prescriptions   No medications on file  Previous Medications   ACETAMINOPHEN (TYLENOL) 500 MG TABLET    Take 1,000 mg by mouth every 6 (six) hours as needed for pain.   ALPRAZOLAM (XANAX) 0.25 MG TABLET    Take 0.25 mg by mouth 2 (two) times daily as needed for sleep.   GABAPENTIN (NEURONTIN) 100 MG CAPSULE    Take 100 mg by mouth 2 (two) times daily.   LEVOTHYROXINE (SYNTHROID, LEVOTHROID) 125 MCG TABLET    Take 125 mcg by mouth  daily before breakfast.   METFORMIN (GLUCOPHAGE) 500 MG TABLET    Take 500 mg by mouth daily with breakfast.   SENNA (SENOKOT) 8.6 MG TABS    Take 1 tablet by mouth 2 (two) times daily.   SKIN PROTECTANTS, MISC. (EUCERIN) CREAM    Apply 1 application topically 2 (two) times daily.   TRAMADOL (ULTRAM) 50 MG TABLET    Take 50 mg by mouth every 6 (six) hours as needed for pain.  Modified Medications   No medications on file  Discontinued Medications   No medications on file     Physical Exam: Physical Exam  Constitutional: He appears well-developed and well-nourished. No distress.  Cardiovascular: Normal rate, regular rhythm and normal heart sounds.   Pulmonary/Chest: Effort normal and breath sounds normal.  Abdominal: Soft. Bowel sounds are normal.  Musculoskeletal: He exhibits no edema and no tenderness.  Currently in gerichair  Skin: Skin is warm and dry. He is not diaphoretic.  Occasional open areas- no signs of infection      Filed Vitals:   12/18/12 1147  BP: 134/66  Pulse: 59  Temp: 97.5 F (36.4 C)  Resp: 20  Height: 5\' 7"  (1.702 m)  Weight: 171 lb (77.565 kg)      Labs reviewed:  11/07/12 CBC with Diff    WBC  4.9        4.0-10.5  K/uL  SLN       RBC  3.87     L  4.22-5.81  MIL/uL  SLN  Hemoglobin  11.4     L  13.0-17.0  g/dL  SLN       Hematocrit  34.9     L  39.0-52.0  %  SLN       MCV  90.2        78.0-100.0  fL  SLN       MCH  29.5        26.0-34.0  pg  SLN       MCHC  32.7        30.0-36.0  g/dL  SLN       RDW  62.1        11.5-15.5  %  SLN       Platelet Count  149     L  150-400  K/uL  SLN       Granulocyte %  52        43-77  %  SLN       Absolute Gran  2.6        1.7-7.7  K/uL  SLN       Lymph %  32        12-46  %  SLN       Absolute Lymph  1.5        0.7-4.0  K/uL  SLN       Mono %  10        3-12  %  SLN       Absolute Mono  0.5        0.1-1.0  K/uL  SLN       Eos %  5        0-5  %  SLN       Absolute Eos  0.2        0.0-0.7  K/uL  SLN        Baso %  1        0-1  %  SLN       Absolute Baso  0.0        0.0-0.1  K/uL  SLN       Smear Review  Criteria for review not met   SLN       TSH, Ultrasensitive      TSH  1.897        0.350-4.500  uIU/mL  SLN       Hemoglobin A1C     Hemoglobin A1C  6.7     H  <5.7  %  SLN  C     Estimated Average Glucose  146     H      Assessment/Plan Chronic airway obstruction, not elsewhere classified Stable on long term oxygen- will cont to monitor and make changes as necessary   Unspecified constipation Patient is stable; continue current regimen. Will monitor and make changes as necessary.   Unspecified hypothyroidism Patient is stable; continue current regimen. Will monitor and make changes as necessary.   Type II or unspecified type diabetes mellitus with neurological manifestations, not stated as uncontrolled(250.60) Patient is stable; continue current regimen. Will monitor and make changes as necessary.   Unspecified hereditary and idiopathic peripheral neuropathy Patient is stable; continue current regimen. Will monitor and make changes as necessary.   Rash and other nonspecific skin eruption Improved after doxycycline

## 2013-01-03 DIAGNOSIS — D649 Anemia, unspecified: Secondary | ICD-10-CM

## 2013-01-03 DIAGNOSIS — L02419 Cutaneous abscess of limb, unspecified: Secondary | ICD-10-CM

## 2013-01-03 DIAGNOSIS — E1169 Type 2 diabetes mellitus with other specified complication: Secondary | ICD-10-CM

## 2013-01-03 DIAGNOSIS — E1165 Type 2 diabetes mellitus with hyperglycemia: Secondary | ICD-10-CM

## 2013-01-03 DIAGNOSIS — L03119 Cellulitis of unspecified part of limb: Secondary | ICD-10-CM

## 2013-01-15 ENCOUNTER — Non-Acute Institutional Stay (SKILLED_NURSING_FACILITY): Payer: Medicare Other | Admitting: Nurse Practitioner

## 2013-01-15 DIAGNOSIS — J449 Chronic obstructive pulmonary disease, unspecified: Secondary | ICD-10-CM

## 2013-01-15 DIAGNOSIS — D638 Anemia in other chronic diseases classified elsewhere: Secondary | ICD-10-CM

## 2013-01-15 DIAGNOSIS — R21 Rash and other nonspecific skin eruption: Secondary | ICD-10-CM

## 2013-01-15 NOTE — Assessment & Plan Note (Signed)
Currently medication r/t copd have been made PRN- pt tolerating well o2 sats 95% on room air

## 2013-01-15 NOTE — Progress Notes (Signed)
Patient ID: [name], male   DOB: [birth year], 79 y.o.   MRN: [id number]  Nursing Home Location:  [facility]   Place of Service: SNF (31)   Chief Complaint: medical management of chronic condition  HPI:  79 year old male seen today for routine follow up; pt with hx of hypothyroid, DM, peripheral neuropathy, and  COPD  244.9-HYPOTHYROIDISM The hypothyroidism is stable.No complications noted from the medication presently being used. takes synthroid 125 mg daily  250.60-DM, COMP NEURO TYPE II The diabetes remains stable.No complications noted from the medication presently being used. metformin 500mg  daily  356.9-NEUROPATHY, PERIPHERAL The peripheral neuropathy is stable.No complications noted from the medication presently being used. takes neurontin 100 mg twice daily  496-COPD The COPD remains stable.No complications noted from the medication presently being used. is using O2 routinely is taking mucinex twice daily takes atrovent neb treatment four times daily and every 6 hours as needed; takes albuterol neb treatments every 6 hours   There has been no concerns from staff regarding Mr Bischoff at this time.    Review of Systems:  Review of Systems  Unable to perform ROS: dementia   ros  Medications: Patient's Medications  New Prescriptions   No medications on file  Previous Medications   ACETAMINOPHEN (TYLENOL) 500 MG TABLET    Take 1,000 mg by mouth every 6 (six) hours as needed for pain.   ALPRAZOLAM (XANAX) 0.25 MG TABLET    Take 0.25 mg by mouth 2 (two) times daily as needed for sleep.   GABAPENTIN (NEURONTIN) 100 MG CAPSULE    Take 100 mg by mouth 2 (two) times daily.   LEVOTHYROXINE (SYNTHROID, LEVOTHROID) 125 MCG TABLET    Take 125 mcg by mouth daily before breakfast.   METFORMIN (GLUCOPHAGE) 500 MG TABLET    Take 500 mg by mouth daily with breakfast.   SENNA (SENOKOT) 8.6 MG TABS    Take 1 tablet by mouth 2 (two) times daily.   SKIN PROTECTANTS, MISC.  (EUCERIN) CREAM    Apply 1 application topically 2 (two) times daily.   TRAMADOL (ULTRAM) 50 MG TABLET    Take 50 mg by mouth every 6 (six) hours as needed for pain.  Modified Medications   No medications on file  Discontinued Medications   No medications on file     Physical Exam:  Filed Vitals:   01/15/13 1313  BP: 144/71  Pulse: 60  Temp: 97.5 F (36.4 C)  Resp: 20  Weight: 175 lb (79.379 kg)  SpO2: 95%    Physical Exam  Constitutional: He appears well-developed and well-nourished. No distress.  HENT:  Head: Normocephalic and atraumatic.  Mouth/Throat: Oropharynx is clear and moist.  Cardiovascular: Normal rate, regular rhythm and normal heart sounds.   Pulmonary/Chest: Effort normal and breath sounds normal.  Abdominal: Soft. Bowel sounds are normal. He exhibits no distension.  Musculoskeletal: He exhibits no edema and no tenderness.  In wheelchair   Neurological: He is alert.  Skin: Skin is warm and dry. He is not diaphoretic.  Open blisters on right should and bilateral buttock, hip and thigh    '    Labs reviewed:  WBC  4.8        4.0-10.5  K/uL  SLN       RBC  3.55     L  4.22-5.81  MIL/uL  SLN       Hemoglobin  10.5     L  13.0-17.0  g/dL  SLN       Hematocrit  31.5     L  39.0-52.0  %  SLN       MCV  88.7        78.0-100.0  fL  SLN       MCH  29.6        26.0-34.0  pg  SLN       MCHC  33.3        30.0-36.0  g/dL  SLN       RDW  16.1        11.5-15.5  %  SLN       Platelet Count  187        150-400  K/uL  SLN      Comprehensive Metabolic Panel       Result: 01/04/2013 9:18 PM    ( Status: F )            Sodium  138        135-145  mEq/L  SLN       Potassium  4.6        3.5-5.3  mEq/L  SLN       Chloride  109        96-112  mEq/L  SLN       CO2  23        19-32  mEq/L  SLN       Glucose  102     H  70-99  mg/dL  SLN       BUN  26     H  6-23  mg/dL  SLN       Creatinine  1.04        0.50-1.35  mg/dL  SLN       Bilirubin, Total  0.4        0.3-1.2   mg/dL  SLN       Alkaline Phosphatase  56        39-117  U/L  SLN       AST/SGOT  10        0-37  U/L  SLN       ALT/SGPT  <8        0-53  U/L  SLN       Total Protein  5.5     L  6.0-8.3  g/dL  SLN       Albumin  3.1     L  3.5-5.2  g/dL  SLN       Calcium  8.3     L      Assessment/Plan Rash and other nonspecific skin eruption Currently with open sores to bilateral hips and left should- currently on doxycycline and gentamycin ointment- will cont current plan of care- being followed by treatment nurse and wound care rounds  COPD (chronic obstructive pulmonary disease) Currently medication r/t copd have been made PRN- pt tolerating well o2 sats 95% on room air  Anemia of chronic disease Stable- will cont to monitor

## 2013-01-15 NOTE — Assessment & Plan Note (Signed)
Stable- will cont to monitor

## 2013-01-15 NOTE — Assessment & Plan Note (Signed)
Currently with open sores to bilateral hips and left should- currently on doxycycline and gentamycin ointment- will cont current plan of care- being followed by treatment nurse and wound care rounds

## 2013-02-20 ENCOUNTER — Non-Acute Institutional Stay (SKILLED_NURSING_FACILITY): Payer: Medicare Other | Admitting: Nurse Practitioner

## 2013-02-20 ENCOUNTER — Encounter: Payer: Self-pay | Admitting: Nurse Practitioner

## 2013-02-20 DIAGNOSIS — R21 Rash and other nonspecific skin eruption: Secondary | ICD-10-CM

## 2013-02-20 DIAGNOSIS — J449 Chronic obstructive pulmonary disease, unspecified: Secondary | ICD-10-CM

## 2013-02-20 DIAGNOSIS — D638 Anemia in other chronic diseases classified elsewhere: Secondary | ICD-10-CM

## 2013-02-20 NOTE — Progress Notes (Signed)
Patient ID: [name], male   DOB: [birth year], 79 y.o.   MRN: [id number]  Nursing Home Location:  [facility]   Place of Service: SNF (31)   Chief Complaint: medical management of chronic conditions   HPI:  79 year old male seen today for routine follow up; pt with hx of hypothyroid, DM, peripheral neuropathy, and COPD. conts to have sores on the side of his hips with increase itch. No fevers or chills.  244.9-HYPOTHYROIDISM The hypothyroidism is stable.No complications noted from the medication presently being used. takes synthroid 125 mg daily  250.60-DM, COMP NEURO TYPE II The diabetes remains stable.No complications noted from the medication presently being used. metformin 500mg  daily  356.9-NEUROPATHY, PERIPHERAL The peripheral neuropathy is stable.No complications noted from the medication presently being used. takes neurontin 100 mg twice daily  496-COPD The COPD remains stable.No complications noted from the medication presently being used.  Review of Systems:  Review of Systems  Unable to perform ROS: dementia     Medications: Patient's Medications  New Prescriptions   No medications on file  Previous Medications   ACETAMINOPHEN (TYLENOL) 500 MG TABLET    Take 1,000 mg by mouth every 6 (six) hours as needed for pain.   ALPRAZOLAM (XANAX) 0.25 MG TABLET    Take 0.25 mg by mouth 2 (two) times daily as needed for sleep.   GABAPENTIN (NEURONTIN) 100 MG CAPSULE    Take 100 mg by mouth 2 (two) times daily.   LEVOTHYROXINE (SYNTHROID, LEVOTHROID) 125 MCG TABLET    Take 125 mcg by mouth daily before breakfast.   METFORMIN (GLUCOPHAGE) 500 MG TABLET    Take 500 mg by mouth daily with breakfast.   SENNA (SENOKOT) 8.6 MG TABS    Take 1 tablet by mouth 2 (two) times daily.   SKIN PROTECTANTS, MISC. (EUCERIN) CREAM    Apply 1 application topically 2 (two) times daily.   TRAMADOL (ULTRAM) 50 MG TABLET    Take 50 mg by mouth every 6 (six) hours as needed for pain.   Modified Medications   No medications on file  Discontinued Medications   No medications on file     Physical Exam:  Filed Vitals:   02/20/13 1628  BP: 115/76  Pulse: 72  Temp: 98 F (36.7 C)  Resp: 20  Weight: 167 lb (75.751 kg)    Physical Exam  Nursing note and vitals reviewed. Constitutional: He is well-developed, well-nourished, and in no distress. No distress.  HENT:  Head: Normocephalic and atraumatic.  Mouth/Throat: Oropharynx is clear and moist. No oropharyngeal exudate.  Neck: Normal range of motion. Neck supple.  Cardiovascular: Normal rate, regular rhythm and normal heart sounds.   Pulmonary/Chest: Effort normal and breath sounds normal.  Abdominal: Soft. Bowel sounds are normal.  Musculoskeletal: Normal range of motion. He exhibits no edema and no tenderness.  Neurological: He is alert.  Skin: Skin is warm and dry. He is not diaphoretic.  Open sores on bilateral hips       Labs reviewed: CBC NO Diff (Complete Blood Count)       Result: 01/04/2013 12:47 PM    ( Status: F )       C     WBC  4.8        4.0-10.5  K/uL  SLN       RBC  3.55     L  4.22-5.81  MIL/uL  SLN       Hemoglobin  10.5  L  13.0-17.0  g/dL  SLN       Hematocrit  31.5     L  39.0-52.0  %  SLN       MCV  88.7        78.0-100.0  fL  SLN       MCH  29.6        26.0-34.0  pg  SLN       MCHC  33.3        30.0-36.0  g/dL  SLN       RDW  86.5        11.5-15.5  %  SLN       Platelet Count  187        150-400  K/uL  SLN      Comprehensive Metabolic Panel       Result: 01/04/2013 9:18 PM    ( Status: F )            Sodium  138        135-145  mEq/L  SLN       Potassium  4.6        3.5-5.3  mEq/L  SLN       Chloride  109        96-112  mEq/L  SLN       CO2  23        19-32  mEq/L  SLN       Glucose  102     H  70-99  mg/dL  SLN       BUN  26     H  6-23  mg/dL  SLN       Creatinine  1.04        0.50-1.35  mg/dL  SLN       Bilirubin, Total  0.4        0.3-1.2  mg/dL  SLN       Alkaline  Phosphatase  56        39-117  U/L  SLN       AST/SGOT  10        0-37  U/L  SLN       ALT/SGPT  <8        0-53  U/L  SLN       Total Protein  5.5     L  6.0-8.3  g/dL  SLN       Albumin  3.1     L  3.5-5.2  g/dL  SLN       Calcium  8.3     L  8.4-10.5  mg/dL  SLN       Assessment/Plan    1.   Anemia of chronic disease 285.29     Pts anemia is stable.    2.   COPD (chronic obstructive pulmonary disease) 496     pts COPD is stable- will cont current regime.   3.   Rash and other nonspecific skin eruption  Unchanged despite antibiotic treatment. Possible contact dermatitis due to briefs. Will have  staff over sores and cut out sides of brief.  To schedule vistaril q 8 hours for 2 weeks

## 2013-03-11 ENCOUNTER — Encounter (HOSPITAL_COMMUNITY): Payer: Self-pay | Admitting: *Deleted

## 2013-03-11 ENCOUNTER — Emergency Department (HOSPITAL_COMMUNITY): Payer: Medicare Other

## 2013-03-11 ENCOUNTER — Inpatient Hospital Stay (HOSPITAL_COMMUNITY): Payer: Medicare Other

## 2013-03-11 ENCOUNTER — Inpatient Hospital Stay (HOSPITAL_COMMUNITY)
Admission: EM | Admit: 2013-03-11 | Discharge: 2013-03-24 | DRG: 871 | Payer: Medicare Other | Attending: Internal Medicine | Admitting: Internal Medicine

## 2013-03-11 DIAGNOSIS — A419 Sepsis, unspecified organism: Principal | ICD-10-CM | POA: Diagnosis present

## 2013-03-11 DIAGNOSIS — J449 Chronic obstructive pulmonary disease, unspecified: Secondary | ICD-10-CM

## 2013-03-11 DIAGNOSIS — E872 Acidosis, unspecified: Secondary | ICD-10-CM | POA: Diagnosis present

## 2013-03-11 DIAGNOSIS — N179 Acute kidney failure, unspecified: Secondary | ICD-10-CM | POA: Diagnosis present

## 2013-03-11 DIAGNOSIS — D61818 Other pancytopenia: Secondary | ICD-10-CM

## 2013-03-11 DIAGNOSIS — R6521 Severe sepsis with septic shock: Secondary | ICD-10-CM | POA: Diagnosis present

## 2013-03-11 DIAGNOSIS — E870 Hyperosmolality and hypernatremia: Secondary | ICD-10-CM | POA: Diagnosis present

## 2013-03-11 DIAGNOSIS — Z66 Do not resuscitate: Secondary | ICD-10-CM | POA: Diagnosis present

## 2013-03-11 DIAGNOSIS — I517 Cardiomegaly: Secondary | ICD-10-CM | POA: Diagnosis present

## 2013-03-11 DIAGNOSIS — R21 Rash and other nonspecific skin eruption: Secondary | ICD-10-CM

## 2013-03-11 DIAGNOSIS — E039 Hypothyroidism, unspecified: Secondary | ICD-10-CM | POA: Diagnosis present

## 2013-03-11 DIAGNOSIS — J9819 Other pulmonary collapse: Secondary | ICD-10-CM | POA: Diagnosis present

## 2013-03-11 DIAGNOSIS — I10 Essential (primary) hypertension: Secondary | ICD-10-CM | POA: Diagnosis present

## 2013-03-11 DIAGNOSIS — R6520 Severe sepsis without septic shock: Secondary | ICD-10-CM | POA: Diagnosis present

## 2013-03-11 DIAGNOSIS — B9689 Other specified bacterial agents as the cause of diseases classified elsewhere: Secondary | ICD-10-CM | POA: Diagnosis present

## 2013-03-11 DIAGNOSIS — J4489 Other specified chronic obstructive pulmonary disease: Secondary | ICD-10-CM | POA: Diagnosis present

## 2013-03-11 DIAGNOSIS — Z85038 Personal history of other malignant neoplasm of large intestine: Secondary | ICD-10-CM

## 2013-03-11 DIAGNOSIS — E1149 Type 2 diabetes mellitus with other diabetic neurological complication: Secondary | ICD-10-CM | POA: Diagnosis present

## 2013-03-11 DIAGNOSIS — G609 Hereditary and idiopathic neuropathy, unspecified: Secondary | ICD-10-CM

## 2013-03-11 DIAGNOSIS — I4891 Unspecified atrial fibrillation: Secondary | ICD-10-CM | POA: Diagnosis present

## 2013-03-11 DIAGNOSIS — N12 Tubulo-interstitial nephritis, not specified as acute or chronic: Secondary | ICD-10-CM | POA: Diagnosis present

## 2013-03-11 DIAGNOSIS — K117 Disturbances of salivary secretion: Secondary | ICD-10-CM

## 2013-03-11 DIAGNOSIS — I495 Sick sinus syndrome: Secondary | ICD-10-CM | POA: Diagnosis present

## 2013-03-11 DIAGNOSIS — N390 Urinary tract infection, site not specified: Secondary | ICD-10-CM | POA: Diagnosis present

## 2013-03-11 DIAGNOSIS — F0390 Unspecified dementia without behavioral disturbance: Secondary | ICD-10-CM | POA: Diagnosis present

## 2013-03-11 DIAGNOSIS — N400 Enlarged prostate without lower urinary tract symptoms: Secondary | ICD-10-CM | POA: Diagnosis present

## 2013-03-11 DIAGNOSIS — G92 Toxic encephalopathy: Secondary | ICD-10-CM | POA: Diagnosis present

## 2013-03-11 DIAGNOSIS — IMO0001 Reserved for inherently not codable concepts without codable children: Secondary | ICD-10-CM

## 2013-03-11 DIAGNOSIS — D638 Anemia in other chronic diseases classified elsewhere: Secondary | ICD-10-CM | POA: Diagnosis present

## 2013-03-11 DIAGNOSIS — J9600 Acute respiratory failure, unspecified whether with hypoxia or hypercapnia: Secondary | ICD-10-CM

## 2013-03-11 DIAGNOSIS — R0600 Dyspnea, unspecified: Secondary | ICD-10-CM

## 2013-03-11 DIAGNOSIS — D709 Neutropenia, unspecified: Secondary | ICD-10-CM | POA: Diagnosis present

## 2013-03-11 DIAGNOSIS — Z515 Encounter for palliative care: Secondary | ICD-10-CM

## 2013-03-11 DIAGNOSIS — E663 Overweight: Secondary | ICD-10-CM | POA: Diagnosis present

## 2013-03-11 DIAGNOSIS — M7989 Other specified soft tissue disorders: Secondary | ICD-10-CM

## 2013-03-11 DIAGNOSIS — G929 Unspecified toxic encephalopathy: Secondary | ICD-10-CM | POA: Diagnosis present

## 2013-03-11 DIAGNOSIS — K5900 Constipation, unspecified: Secondary | ICD-10-CM

## 2013-03-11 DIAGNOSIS — I2510 Atherosclerotic heart disease of native coronary artery without angina pectoris: Secondary | ICD-10-CM | POA: Diagnosis present

## 2013-03-11 DIAGNOSIS — B964 Proteus (mirabilis) (morganii) as the cause of diseases classified elsewhere: Secondary | ICD-10-CM | POA: Diagnosis present

## 2013-03-11 DIAGNOSIS — R7881 Bacteremia: Secondary | ICD-10-CM | POA: Diagnosis present

## 2013-03-11 DIAGNOSIS — E860 Dehydration: Secondary | ICD-10-CM

## 2013-03-11 HISTORY — DX: Depression, unspecified: F32.A

## 2013-03-11 HISTORY — DX: Heart failure, unspecified: I50.9

## 2013-03-11 HISTORY — DX: Acute embolism and thrombosis of unspecified deep veins of unspecified lower extremity: I82.409

## 2013-03-11 HISTORY — DX: Cardiac murmur, unspecified: R01.1

## 2013-03-11 HISTORY — DX: Presence of cardiac pacemaker: Z95.0

## 2013-03-11 HISTORY — DX: Pneumonia, unspecified organism: J18.9

## 2013-03-11 HISTORY — DX: Unspecified hearing loss, unspecified ear: H91.90

## 2013-03-11 HISTORY — DX: Disorder of kidney and ureter, unspecified: N28.9

## 2013-03-11 HISTORY — DX: Personal history of other diseases of the digestive system: Z87.19

## 2013-03-11 HISTORY — DX: Other forms of dyspnea: R06.09

## 2013-03-11 HISTORY — DX: Other chronic pain: G89.29

## 2013-03-11 HISTORY — DX: Dorsalgia, unspecified: M54.9

## 2013-03-11 HISTORY — DX: Unspecified osteoarthritis, unspecified site: M19.90

## 2013-03-11 HISTORY — DX: Malignant neoplasm of colon, unspecified: C18.9

## 2013-03-11 HISTORY — DX: Chronic atrial fibrillation, unspecified: I48.20

## 2013-03-11 HISTORY — DX: Phlebitis and thrombophlebitis of unspecified site: I80.9

## 2013-03-11 HISTORY — DX: Essential (primary) hypertension: I10

## 2013-03-11 HISTORY — DX: Dysphagia, unspecified: R13.10

## 2013-03-11 HISTORY — DX: Major depressive disorder, single episode, unspecified: F32.9

## 2013-03-11 HISTORY — DX: Unspecified dementia, unspecified severity, without behavioral disturbance, psychotic disturbance, mood disturbance, and anxiety: F03.90

## 2013-03-11 HISTORY — DX: Basal cell carcinoma of skin of unspecified ear and external auricular canal: C44.211

## 2013-03-11 HISTORY — DX: Cerebral infarction, unspecified: I63.9

## 2013-03-11 HISTORY — DX: Dyspnea, unspecified: R06.00

## 2013-03-11 HISTORY — DX: Urinary tract infection, site not specified: N39.0

## 2013-03-11 LAB — CBC
HCT: 34 % — ABNORMAL LOW (ref 39.0–52.0)
Hemoglobin: 10.6 g/dL — ABNORMAL LOW (ref 13.0–17.0)
MCH: 29.9 pg (ref 26.0–34.0)
MCHC: 31.2 g/dL (ref 30.0–36.0)
MCV: 95.8 fL (ref 78.0–100.0)

## 2013-03-11 LAB — POCT I-STAT 3, ART BLOOD GAS (G3+)
Acid-base deficit: 14 mmol/L — ABNORMAL HIGH (ref 0.0–2.0)
Patient temperature: 38.7
pO2, Arterial: 162 mmHg — ABNORMAL HIGH (ref 80.0–100.0)

## 2013-03-11 LAB — URINALYSIS, ROUTINE W REFLEX MICROSCOPIC
Bilirubin Urine: NEGATIVE
Glucose, UA: NEGATIVE mg/dL
Ketones, ur: 15 mg/dL — AB
pH: 8 (ref 5.0–8.0)

## 2013-03-11 LAB — COMPREHENSIVE METABOLIC PANEL
ALT: 8 U/L (ref 0–53)
AST: 15 U/L (ref 0–37)
Albumin: 3 g/dL — ABNORMAL LOW (ref 3.5–5.2)
Alkaline Phosphatase: 89 U/L (ref 39–117)
Glucose, Bld: 189 mg/dL — ABNORMAL HIGH (ref 70–99)
Potassium: 4.5 mEq/L (ref 3.5–5.1)
Sodium: 147 mEq/L — ABNORMAL HIGH (ref 135–145)
Total Protein: 6.8 g/dL (ref 6.0–8.3)

## 2013-03-11 LAB — CBC WITH DIFFERENTIAL/PLATELET
Basophils Relative: 1 % (ref 0–1)
Eosinophils Absolute: 0 10*3/uL (ref 0.0–0.7)
Lymphs Abs: 0.2 10*3/uL — ABNORMAL LOW (ref 0.7–4.0)
MCH: 29.8 pg (ref 26.0–34.0)
Neutrophils Relative %: 86 % — ABNORMAL HIGH (ref 43–77)
Platelets: 135 10*3/uL — ABNORMAL LOW (ref 150–400)
RBC: 4.1 MIL/uL — ABNORMAL LOW (ref 4.22–5.81)

## 2013-03-11 MED ORDER — SODIUM CHLORIDE 0.9 % IJ SOLN
INTRAMUSCULAR | Status: AC
  Administered 2013-03-11: 10 mL
  Filled 2013-03-11: qty 10

## 2013-03-11 MED ORDER — ACETAMINOPHEN 650 MG RE SUPP
650.0000 mg | RECTAL | Status: DC | PRN
  Administered 2013-03-11 – 2013-03-22 (×3): 650 mg via RECTAL
  Filled 2013-03-11 (×3): qty 1

## 2013-03-11 MED ORDER — PIPERACILLIN-TAZOBACTAM IN DEX 2-0.25 GM/50ML IV SOLN
2.2500 g | Freq: Three times a day (TID) | INTRAVENOUS | Status: DC
  Administered 2013-03-11 – 2013-03-13 (×6): 2.25 g via INTRAVENOUS
  Filled 2013-03-11 (×8): qty 50

## 2013-03-11 MED ORDER — ACETAMINOPHEN 650 MG RE SUPP
650.0000 mg | Freq: Once | RECTAL | Status: AC
  Administered 2013-03-11: 650 mg via RECTAL
  Filled 2013-03-11: qty 1

## 2013-03-11 MED ORDER — SODIUM CHLORIDE 0.9 % IV SOLN
INTRAVENOUS | Status: DC
  Administered 2013-03-11: 16:00:00 via INTRAVENOUS

## 2013-03-11 MED ORDER — LORAZEPAM 2 MG/ML IJ SOLN
INTRAMUSCULAR | Status: AC
  Filled 2013-03-11: qty 1

## 2013-03-11 MED ORDER — VANCOMYCIN HCL 10 G IV SOLR
1250.0000 mg | Freq: Once | INTRAVENOUS | Status: AC
  Administered 2013-03-11: 1250 mg via INTRAVENOUS
  Filled 2013-03-11: qty 1250

## 2013-03-11 MED ORDER — SODIUM CHLORIDE 0.9 % IJ SOLN
3.0000 mL | Freq: Two times a day (BID) | INTRAMUSCULAR | Status: DC
  Administered 2013-03-11: 3 mL via INTRAVENOUS

## 2013-03-11 MED ORDER — SODIUM CHLORIDE 0.9 % IV SOLN
1000.0000 mL | INTRAVENOUS | Status: DC
Start: 2013-03-11 — End: 2013-03-11
  Administered 2013-03-11: 1000 mL via INTRAVENOUS

## 2013-03-11 MED ORDER — VANCOMYCIN HCL IN DEXTROSE 1-5 GM/200ML-% IV SOLN: 1000.0000 mg | INTRAVENOUS | Status: DC

## 2013-03-11 MED ORDER — LEVOTHYROXINE SODIUM 125 MCG PO TABS
125.0000 ug | ORAL_TABLET | Freq: Every day | ORAL | Status: DC
  Filled 2013-03-11 (×2): qty 1

## 2013-03-11 MED ORDER — DEXTROSE 5 % IV SOLN
2.0000 g | Freq: Once | INTRAVENOUS | Status: AC
  Administered 2013-03-11: 2 g via INTRAVENOUS
  Filled 2013-03-11: qty 2

## 2013-03-11 MED ORDER — HEPARIN SODIUM (PORCINE) 5000 UNIT/ML IJ SOLN
5000.0000 [IU] | Freq: Three times a day (TID) | INTRAMUSCULAR | Status: DC
  Administered 2013-03-11 – 2013-03-16 (×14): 5000 [IU] via SUBCUTANEOUS
  Filled 2013-03-11 (×18): qty 1

## 2013-03-11 MED ORDER — SODIUM CHLORIDE 0.9 % IV SOLN
1000.0000 mL | Freq: Once | INTRAVENOUS | Status: AC
  Administered 2013-03-11: 1000 mL via INTRAVENOUS

## 2013-03-11 MED ORDER — LORAZEPAM 2 MG/ML IJ SOLN
0.5000 mg | Freq: Once | INTRAMUSCULAR | Status: AC
  Administered 2013-03-11: 0.5 mg via INTRAVENOUS

## 2013-03-11 MED ORDER — ACETAMINOPHEN 325 MG RE SUPP: 975.0000 mg | Freq: Once | RECTAL | Status: DC

## 2013-03-11 MED ORDER — SODIUM CHLORIDE 0.9 % IV BOLUS (SEPSIS)
500.0000 mL | Freq: Once | INTRAVENOUS | Status: AC
  Administered 2013-03-11: 500 mL via INTRAVENOUS

## 2013-03-11 NOTE — Consult Note (Signed)
PULMONARY  / CRITICAL CARE MEDICINE  Name: [name] MRN: [id number] DOB: [birth year]    ADMISSION DATE:  03/11/2013 CONSULTATION DATE:  03/11/13  REFERRING MD :  Mahala Menghini PRIMARY SERVICE: Internal medicine  CHIEF COMPLAINT:  AMS  BRIEF PATIENT DESCRIPTION: 79 year old male SNF resident admitted 7/15 with fever, chills and SOB.   SIGNIFICANT EVENTS / STUDIES:  7/15 Admit  LINES / TUBES: 7/15 Foley >>> 7/15 PIV >>>  CULTURES: 7/15 Blood x2 >>> 7/15 Urine >>>  ANTIBIOTICS: 7/15 Vancomycin >>> 7/15 Zosyn >>>  HISTORY OF PRESENT ILLNESS:  [name] is a 79 year old male with a history of DM, COPD and dementia. Pt lives in SNF and was thought to be having seizure activity on 7/15 and brought to ED by EMS for evaluation. He was found to be febrile (T-max 40.1), chills and SOB. Pt has a chronic indwelling catheter.   Pt was made DNR by family with wishes for comfort measures.   PCCM is asked to see the patient for recommendations with BP management.   PAST MEDICAL HISTORY :  Past Medical History  Diagnosis Date  . Unspecified hypothyroidism   . Type II or unspecified type diabetes mellitus with neurological manifestations, not stated as uncontrolled(250.60)   . Unspecified hereditary and idiopathic peripheral neuropathy   . Chronic airway obstruction, not elsewhere classified   . Unspecified constipation   . Rash and other nonspecific skin eruption    History reviewed. No pertinent past surgical history. Prior to Admission medications   Medication Sig Start Date End Date Taking? Authorizing Provider  acetaminophen (TYLENOL) 500 MG tablet Take 1,000 mg by mouth every 6 (six) hours as needed for pain.   Yes Historical Provider, MD  gabapentin (NEURONTIN) 100 MG capsule Take 100 mg by mouth 2 (two) times daily.   Yes Historical Provider, MD  levothyroxine (SYNTHROID, LEVOTHROID) 125 MCG tablet Take 125 mcg by mouth daily before breakfast.   Yes Historical Provider, MD   metFORMIN (GLUCOPHAGE) 500 MG tablet Take 500 mg by mouth daily with breakfast.   Yes Historical Provider, MD  senna (SENOKOT) 8.6 MG TABS Take 1 tablet by mouth 2 (two) times daily.   Yes Historical Provider, MD  Skin Protectants, Misc. (EUCERIN) cream Apply 1 application topically 2 (two) times daily.   Yes Historical Provider, MD  traMADol (ULTRAM) 50 MG tablet Take 50 mg by mouth every 6 (six) hours as needed for pain.   Yes Historical Provider, MD  ALPRAZolam (XANAX) 0.25 MG tablet Take 0.25 mg by mouth 2 (two) times daily as needed for sleep.    Historical Provider, MD   Allergies  Allergen Reactions  . Sulfa Antibiotics Other (See Comments)    unknown    FAMILY HISTORY:  History reviewed. No pertinent family history. SOCIAL HISTORY:  reports that he does not use illicit drugs. His tobacco and alcohol histories are not on file.  REVIEW OF SYSTEMS:  Unable due to mental status  SUBJECTIVE:   Non-verbal in bed  VITAL SIGNS: Temp:  [100.4 F (38 C)-104.2 F (40.1 C)] 101.8 F (38.8 C) (07/15 1208) Pulse Rate:  [42-127] 100 (07/15 1208) Resp:  [21-48] 21 (07/15 1208) BP: (91-129)/(44-92) 99/45 mmHg (07/15 1208) SpO2:  [69 %-100 %] 99 % (07/15 1208) FiO2 (%):  [100 %] 100 % (07/15 1045) Weight:  [75.8 kg (167 lb 1.7 oz)] 75.8 kg (167 lb 1.7 oz) (07/15 1136) HEMODYNAMICS:   VENTILATOR SETTINGS: Vent Mode:  [-]  FiO2 (%):  [  100 %] 100 % INTAKE / OUTPUT: Intake/Output   None     PHYSICAL EXAMINATION: General:  Chronically ill appearing elderly male Neuro:  Baseline non-verbal, dementia HEENT:  No scleral icterus Cardiovascular:  Tachycardia, S1 and S2, no JVD Lungs:  Tachypnea, CTA anterior Abdomen:  Round, soft, non-tender. Active BS Musculoskeletal:  Contracted extremities Skin:  Mottled, dry  LABS:  Recent Labs Lab 03/11/13 1045 03/11/13 1120 03/11/13 1243  HGB 12.2*  --   --   WBC 1.8*  --   --   PLT 135*  --   --   NA 147*  --   --   K 4.5  --    --   CL 114*  --   --   CO2 14*  --   --   GLUCOSE 189*  --   --   BUN 42*  --   --   CREATININE 2.15*  --   --   CALCIUM 9.2  --   --   AST 15  --   --   ALT 8  --   --   ALKPHOS 89  --   --   BILITOT 0.6  --   --   PROT 6.8  --   --   ALBUMIN 3.0*  --   --   LATICACIDVEN  --  6.48*  --   PHART  --   --  7.278*  PCO2ART  --   --  24.5*  PO2ART  --   --  162.0*   No results found for this basename: GLUCAP,  in the last 168 hours  ASSESSMENT / PLAN:  Acute Respiratory Failure due to septic shock Severe Sepsis due to UTI Septic Shock Acute Kidney Injury Metabolic Acidosis Neutropenia Dementia Hypothyroidism DM   Recommendations: [name] presents with severe sepsis in the setting of a likely UTI from his chronic indwelling catheter. He is showing signs of multiple organ dysfunction and failure. His stated wishes and family wishes are for DNR status considering his baseline level of functioning with dementia. We will plan for IV hydration with NS and cover with broad spectrum antibiotics. Will not offer mechanical ventilation or vasoactive medications in accordance with the patient and family wishes. His prognosis is overall very poor and he will likely die as a result of this infection.  60 minutes CC time   Levy Pupa, MD, PhD 03/11/2013, 3:33 PM [facility] [phone] or if no answer [phone]

## 2013-03-11 NOTE — H&P (Signed)
[facility] History and Physical  [name] AVW:[id number] DOB: [birth year] DOA: 03/11/2013  Referring physician: tran PCP: Tony Aden, NP  Specialists: none  Chief Complaint: Toxic encephalopathy  HPI: [name] is a 79 y.o. male known  Dementia, h/o Afib c CHad2Vac~5, CAD, MVR of S/p annuloplasty 06/2002, h/o colon cancer x 2 [2001] PPM, Htn,DM,COPD, Chronic indwelling foley 2.2 to BPH with known Eelvated PSA, H/o Cdiff was brought over from [facility] am of 03/11/13.  History is severely limited by patient;s condition-I did however speak to nurse Greta RN who informs me this morning he was shaking and was very warm and it was found he had a temp 102.9, Pulse 218, and O2 sats 85% on 4 l-Cbg at the time was 216-He was given tylenol x 2 and NP [name] was notified 2/2 to abd pain and he was sent to ED for evaluation.  He ne Normally he is total care and not oriented.  The only thing he can do is feed himself.  She reports his foley was change don Sunday as this was leaking.  On 7/14 seemed to have no issues and-she reports no cough, no diarrhea or other issues recently and has been doing fairly well Was recently Rx for wound on both hips for MRSA with doxycycline last month. In the emergency room he was noted to have-/24 and 5/2, blood cultures drawn, portable chest x-ray = low lung volumes. Probable subsegmental atelectasis, and mild cardiomegaly. Pulmonary venous congestion.   Sodium 147, chloride 114 urine, creatinine 42/15, baseline 14/1.12 CO2 14 Lactic acid is 6.4 Glucose 189 WBC 1. His brother [name] is Cell # [phone], Home @# [phone] who confirms DNR status and doesn't think he would have wanted IV Pressors but isn't sure  Review of Systems: The patient is \\obtunded  and cannot repsond meaningfully  Past Medical History  Diagnosis Date  . Unspecified hypothyroidism   . Type II or unspecified type diabetes mellitus with  neurological manifestations, not stated as uncontrolled(250.60)   . Unspecified hereditary and idiopathic peripheral neuropathy   . Chronic airway obstruction, not elsewhere classified   . Unspecified constipation   . Rash and other nonspecific skin eruption    History reviewed. No pertinent past surgical history. Social History:  reports that he does not use illicit drugs. His tobacco and alcohol histories are not on file.  patient lives at skilled nursing facility.  Allergies  Allergen Reactions  . Sulfa Antibiotics Other (See Comments)    unknown    History reviewed. No pertinent family history. unable to obtain given severity of illness and patient not able to respond   Prior to Admission medications   Medication Sig Start Date End Date Taking? Authorizing Provider  acetaminophen (TYLENOL) 500 MG tablet Take 1,000 mg by mouth every 6 (six) hours as needed for pain.   Yes Historical Provider, MD  gabapentin (NEURONTIN) 100 MG capsule Take 100 mg by mouth 2 (two) times daily.   Yes Historical Provider, MD  levothyroxine (SYNTHROID, LEVOTHROID) 125 MCG tablet Take 125 mcg by mouth daily before breakfast.   Yes Historical Provider, MD  metFORMIN (GLUCOPHAGE) 500 MG tablet Take 500 mg by mouth daily with breakfast.   Yes Historical Provider, MD  senna (SENOKOT) 8.6 MG TABS Take 1 tablet by mouth 2 (two) times daily.   Yes Historical Provider, MD  Skin Protectants, Misc. (EUCERIN) cream Apply 1 application topically 2 (two) times daily.   Yes Historical Provider, MD  traMADol (ULTRAM) 50 MG tablet Take 50 mg by mouth every 6 (six) hours as needed for pain.   Yes Historical Provider, MD  ALPRAZolam (XANAX) 0.25 MG tablet Take 0.25 mg by mouth 2 (two) times daily as needed for sleep.    Historical Provider, MD   Physical Exam: Filed Vitals:   03/11/13 1130 03/11/13 1136 03/11/13 1200 03/11/13 1208  BP: 92/65   99/45  Pulse: 93  42 100  Temp: 103.1 F (39.5 C)  100.4 F (38 C) 101.8 F  (38.8 C)  TempSrc:      Resp: 23  39 21  Height:  5' 6.93" (1.7 m)    Weight:  75.8 kg (167 lb 1.7 oz)    SpO2: 100%  69% 99%     General:  Eyes open, but able to respond verbally at GCS score = E4, M4, V4=12-reacts to menance  Eyes: Open   ENT: Poor dentition, neck soft supple   Neck: See above   Cardiovascular: S1, S2, tachycardic, midline. Chest abdominal scar   Respiratory: Clinically clear   Abdomen: Abdomen seems to sounds are heard. He   Skin: Lower extremity pitting edema, arthritic changes   Musculoskeletal: Range of motion   Psychiatric: Euthymic   Neurologic: obtunded, moving upper limbs non-purposefully  Labs on Admission:  Basic Metabolic Panel:  Recent Labs Lab 03/11/13 1045  NA 147*  K 4.5  CL 114*  CO2 14*  GLUCOSE 189*  BUN 42*  CREATININE 2.15*  CALCIUM 9.2   Liver Function Tests:  Recent Labs Lab 03/11/13 1045  AST 15  ALT 8  ALKPHOS 89  BILITOT 0.6  PROT 6.8  ALBUMIN 3.0*   No results found for this basename: LIPASE, AMYLASE,  in the last 168 hours No results found for this basename: AMMONIA,  in the last 168 hours CBC:  Recent Labs Lab 03/11/13 1045  WBC 1.8*  NEUTROABS 1.6*  HGB 12.2*  HCT 37.8*  MCV 92.2  PLT 135*   Cardiac Enzymes: No results found for this basename: CKTOTAL, CKMB, CKMBINDEX, TROPONINI,  in the last 168 hours  BNP (last 3 results) No results found for this basename: PROBNP,  in the last 8760 hours CBG: No results found for this basename: GLUCAP,  in the last 168 hours  Radiological Exams on Admission: Dg Chest Portable 1 View  03/11/2013   *RADIOLOGY REPORT*  Clinical Data: Fever.  PORTABLE CHEST - 1 VIEW  Comparison: Chest x-ray 06/14/2010.  Findings: Linear opacity at the left base favored to represent subsegmental atelectasis (underlying air space consolidation is difficult to entirely exclude).  No definite pleural effusions. Pulmonary venous congestion, without frank pulmonary edema.   Heart size appears mildly enlarged. The patient is rotated to the left on today's exam, resulting in distortion of the mediastinal contours and reduced diagnostic sensitivity and specificity for mediastinal pathology.  Atherosclerosis in the thoracic aorta.  Left-sided pacemaker device in place with lead tip projecting over the expected location of the right ventricular apex.  Status post median sternotomy.  IMPRESSION: 1.  Low lung volumes with probable subsegmental atelectasis in the left lower lobe. 2.  Mild cardiomegaly with pulmonary venous congestion. 3.  Atherosclerosis. 4.  Postoperative changes and support apparatus, as above.   Original Report Authenticated By: Trudie Reed, M.D.    EKG: Independently reviewed. Atrial fibrillation at rate in the 120s 130s. No ST segment elevations or   Assessment/Plan  1. mods-source, likely urinary-Broaden spectrum to Vanc and Zosyn.  Rule out other sources stat CT abdomen pelvis without contrast. Family states reasonable to attmept pressors but not completely sure of outcome-I have done my best to explain this may temporize things and give him a chance to get better as overall looks gaurded-I've consulted PCCM re: potential rate controlling agents? Digoxin if heart rate told him appreciate input in advance.  WIll also speak with Palliative medicine 2. Atrial fibrillation, Chad2vasc 5 complicated by tachybradycardia syndrome and pacemaker-currently off Coumadin. Would hold off on the for now. If survives this event, probably place back on aspirin-Might need Digoxin IV for Rate control as situation evolves 3. Diabetes mellitus cover with sliding scale every 4 hourly.  Suspect metformin may also contribute to significant lactic acidosis 4. moderate to severe dementia per her report-hold off on Xanax, other medications at present  Ccm/Pallaitve consulted  Code Status: DNR, pressors ok Family Communication: Long discussion with brother on  telephone Disposition Plan: SDU, [facility]   Time spent: 2  Mahala Menghini [facility] Pager [phone]  If 7PM-7AM, please contact night-coverage [email or web address] Password [facility] 03/11/2013, 12:33 PM

## 2013-03-11 NOTE — ED Provider Notes (Signed)
History    CSN: [id number] Arrival date & time 03/11/13  1026  First MD Initiated Contact with Patient 03/11/13 1037     No chief complaint on file.  (Consider location/radiation/quality/duration/timing/severity/associated sxs/prior Treatment) HPI  79 year old male with history of diabetes, COPD, and thyroid disease was brought here for evaluations of seizure.  Patient lives at [facility].  Was sent here when he was found to be febrile, shaking, c/o abd pain, and having SOB.  History was limited due to lack of documentation, and due to the patient's dementia. Patient is a DO NOT RESUSCITATE/DO NOT INTUBATE. Patient has indwelling for catheter. No prior history of seizure.  Past Medical History  Diagnosis Date  . Unspecified hypothyroidism   . Type II or unspecified type diabetes mellitus with neurological manifestations, not stated as uncontrolled(250.60)   . Unspecified hereditary and idiopathic peripheral neuropathy   . Chronic airway obstruction, not elsewhere classified   . Unspecified constipation   . Rash and other nonspecific skin eruption    No past surgical history on file. No family history on file. History  Substance Use Topics  . Smoking status: Unknown If Ever Smoked  . Smokeless tobacco: Not on file  . Alcohol Use: Not on file    Review of Systems  Unable to perform ROS: Dementia    Allergies  Sulfa antibiotics  Home Medications   Current Outpatient Rx  Name  Route  Sig  Dispense  Refill  . acetaminophen (TYLENOL) 500 MG tablet   Oral   Take 1,000 mg by mouth every 6 (six) hours as needed for pain.         Marland Kitchen gabapentin (NEURONTIN) 100 MG capsule   Oral   Take 100 mg by mouth 2 (two) times daily.         Marland Kitchen levothyroxine (SYNTHROID, LEVOTHROID) 125 MCG tablet   Oral   Take 125 mcg by mouth daily before breakfast.         . metFORMIN (GLUCOPHAGE) 500 MG tablet   Oral   Take 500 mg by mouth daily with breakfast.          . senna (SENOKOT) 8.6 MG TABS   Oral   Take 1 tablet by mouth 2 (two) times daily.         . Skin Protectants, Misc. (EUCERIN) cream   Topical   Apply 1 application topically 2 (two) times daily.         . traMADol (ULTRAM) 50 MG tablet   Oral   Take 50 mg by mouth every 6 (six) hours as needed for pain.         Marland Kitchen ALPRAZolam (XANAX) 0.25 MG tablet   Oral   Take 0.25 mg by mouth 2 (two) times daily as needed for sleep.          There were no vitals taken for this visit. Physical Exam  Nursing note and vitals reviewed. Constitutional: He appears distressed (Chronically ill-appearing male appears altered, moaning.).  HENT:  Head: Normocephalic and atraumatic.  Mouth is dry, tongue is dry  Eyes: Conjunctivae and EOM are normal.  Neck: Neck supple. No JVD present.  Cardiovascular: Normal rate and regular rhythm.   Pulmonary/Chest: Effort normal and breath sounds normal.  Abdominal: There is tenderness (Patient was in pain when the suprapubic region was palpated. Well-healing midabdominal surgical scar without evidence of infection.).  Genitourinary:  Indwelling Foley catheter, strong urine odor.  Musculoskeletal: He exhibits no edema.  Neurological:  Patient only response to pain stimulus  Skin: Rash (abrasive rash noted to bilateral upper thigh, noninfected) noted.    ED Course  Procedures (including critical care time)   Date: 03/11/2013  Rate: 88  Rhythm: atrial fibrillation  QRS Axis: left  Intervals: normal  ST/T Wave abnormalities: nonspecific ST/T changes  Conduction Disutrbances:none  Narrative Interpretation:   Old EKG Reviewed: none available     10:58 AM Pt brought here for suspected seizure, however likely rigors from infection.  Has temp of 104.2.  Will give tylenol supository, ice pack placed in groin and arm pits.  Pt is a DNR/DNI.  Pt has indwelling foley, strong urine odor with sedimentation, suspecious of urosepsis.  Code sepsis  initiated.  Care discussed with attending.  Will have close monitoring.  Pt currently hemodynamically stable.  abx initiated per protocol.  12:19 PM Patient became hypotensive with blood pressures of 91/54. Blood pressures improved after receiving 2 L of normal saline. Current systolic blood pressures of 110.  Temperature improves after receiving Tylenol. UA with evidence of urinary tract infection. Patient also has evidence of acute renal insufficiency with BUN 42, creatinine 2.15. He also has evidence of pancytopenia. CO2 is 14. GCS score of 9-10.    12:28 PM My attending has discussed with pt's brother in regard to level of care.  I have consulted with [facility], Dr. Mahala Menghini who agrees to admit pt to to inpt, step down, team 8, under his care.    CRITICAL CARE Performed by: Fayrene Helper Total critical care time: 1 hr Critical care time was exclusive of separately billable procedures and treating other patients. Critical care was necessary to treat or prevent imminent or life-threatening deterioration. Critical care was time spent personally by me on the following activities: development of treatment plan with patient and/or surrogate as well as nursing, discussions with consultants, evaluation of patient's response to treatment, examination of patient, obtaining history from patient or surrogate, ordering and performing treatments and interventions, ordering and review of laboratory studies, ordering and review of radiographic studies, pulse oximetry and re-evaluation of patient's condition.   Labs Reviewed  CBC WITH DIFFERENTIAL - Abnormal; Notable for the following:    WBC 1.8 (*)    RBC 4.10 (*)    Hemoglobin 12.2 (*)    HCT 37.8 (*)    Platelets 135 (*)    Neutrophils Relative % 86 (*)    Neutro Abs 1.6 (*)    Lymphs Abs 0.2 (*)    Monocytes Relative 1 (*)    Monocytes Absolute 0.0 (*)    All other components within normal limits  COMPREHENSIVE METABOLIC PANEL - Abnormal;  Notable for the following:    Sodium 147 (*)    Chloride 114 (*)    CO2 14 (*)    Glucose, Bld 189 (*)    BUN 42 (*)    Creatinine, Ser 2.15 (*)    Albumin 3.0 (*)    GFR calc non Af Amer 24 (*)    GFR calc Af Amer 28 (*)    All other components within normal limits  URINALYSIS, ROUTINE W REFLEX MICROSCOPIC - Abnormal; Notable for the following:    Color, Urine AMBER (*)    APPearance TURBID (*)    Hgb urine dipstick LARGE (*)    Ketones, ur 15 (*)    Protein, ur >300 (*)    Leukocytes, UA LARGE (*)    All other components within normal limits  URINE MICROSCOPIC-ADD ON -  Abnormal; Notable for the following:    Squamous Epithelial / LPF FEW (*)    Bacteria, UA MANY (*)    All other components within normal limits  CG4 I-STAT (LACTIC ACID) - Abnormal; Notable for the following:    Lactic Acid, Venous 6.48 (*)    All other components within normal limits  CULTURE, BLOOD (ROUTINE X 2)  CULTURE, BLOOD (ROUTINE X 2)  URINE CULTURE   Dg Chest Portable 1 View  03/11/2013   *RADIOLOGY REPORT*  Clinical Data: Fever.  PORTABLE CHEST - 1 VIEW  Comparison: Chest x-ray 06/14/2010.  Findings: Linear opacity at the left base favored to represent subsegmental atelectasis (underlying air space consolidation is difficult to entirely exclude).  No definite pleural effusions. Pulmonary venous congestion, without frank pulmonary edema.  Heart size appears mildly enlarged. The patient is rotated to the left on today's exam, resulting in distortion of the mediastinal contours and reduced diagnostic sensitivity and specificity for mediastinal pathology.  Atherosclerosis in the thoracic aorta.  Left-sided pacemaker device in place with lead tip projecting over the expected location of the right ventricular apex.  Status post median sternotomy.  IMPRESSION: 1.  Low lung volumes with probable subsegmental atelectasis in the left lower lobe. 2.  Mild cardiomegaly with pulmonary venous congestion. 3.   Atherosclerosis. 4.  Postoperative changes and support apparatus, as above.   Original Report Authenticated By: Trudie Reed, M.D.   1. Urosepsis   2. Pancytopenia   3. Lactic acidosis   4. Acute renal failure   5. Dehydration     MDM  BP 99/45  Pulse 100  Temp(Src) 101.8 F (38.8 C) (Rectal)  Resp 21  Ht 5' 6.93" (1.7 m)  Wt 167 lb 1.7 oz (75.8 kg)  BMI 26.23 kg/m2  SpO2 99%  I have reviewed nursing notes and vital signs. I personally reviewed the imaging tests through PACS system  I reviewed available ER/hospitalization records thought the EMR   Fayrene Helper, PA-C 03/11/13 1236  Fayrene Helper, PA-C 03/11/13 1242

## 2013-03-11 NOTE — Progress Notes (Signed)
ANTIBIOTIC CONSULT NOTE - INITIAL  Pharmacy Consult for Vancomycin and Zosyn Indication: rule out sepsis  Allergies  Allergen Reactions  . Sulfa Antibiotics Other (See Comments)    unknown    Patient Measurements: Height: 5' 6.93" (170 cm) Weight: 167 lb 1.7 oz (75.8 kg) IBW/kg (Calculated) : 65.94 Adjusted Body Weight:   Vital Signs: Temp: 101.8 F (38.8 C) (07/15 1208) Temp src: Rectal (07/15 1043) BP: 99/45 mmHg (07/15 1208) Pulse Rate: 100 (07/15 1208) Intake/Output from previous day:   Intake/Output from this shift:    Labs:  Recent Labs  03/11/13 1045  WBC 1.8*  HGB 12.2*  PLT 135*  CREATININE 2.15*   Estimated Creatinine Clearance: 18.7 ml/min (by C-G formula based on Cr of 2.15). No results found for this basename: VANCOTROUGH, VANCOPEAK, VANCORANDOM, GENTTROUGH, GENTPEAK, GENTRANDOM, TOBRATROUGH, TOBRAPEAK, TOBRARND, AMIKACINPEAK, AMIKACINTROU, AMIKACIN,  in the last 72 hours   Microbiology: No results found for this or any previous visit (from the past 720 hour(s)).  Medical History: Past Medical History  Diagnosis Date  . Unspecified hypothyroidism   . Type II or unspecified type diabetes mellitus with neurological manifestations, not stated as uncontrolled(250.60)   . Unspecified hereditary and idiopathic peripheral neuropathy   . Chronic airway obstruction, not elsewhere classified   . Unspecified constipation   . Rash and other nonspecific skin eruption     Medications:  Cefepime 2g IV STAT  Assessment: 79 y/o male [facility] from nursing facility. Pharmacy has been consulted to dose vancomycin and zosyn to rule out sepsis. Cefepime 2g was ordered by EDP and given within 1 hour of code sepsis being called. The patient is in acute renal failure and antibiotics have been appropriately adjusted.  Goal of Therapy:  Start antibiotic within 1 hour of arriving at hospital  Plan:  1. Vancomycin 1250mg  IV loading dose 2. Vancomycin 1000mg  IV  q 48 hrs 3. Zosyn 2.25g IV q 8 hrs 4. Monitor renal function - CrCl, Scr and follow up with cultures.  Joyice Faster A 03/11/2013,1:30 PM

## 2013-03-11 NOTE — Progress Notes (Signed)
Utilization review completed.  

## 2013-03-11 NOTE — ED Notes (Signed)
Dr Freida Busman has been in to speak with pt brother. The brother requests pt be made a dnr.

## 2013-03-11 NOTE — ED Notes (Signed)
Patient is critically ill. He is not coherant. Ice packs placed to pt groin and at neck to begin cooling temp. Second line started with fluid bolus infusing. First set of cultures drawn. Pt stooled and has been cleaned . He is afib rvr rate 140s. He moans when touched or turned.

## 2013-03-11 NOTE — ED Notes (Signed)
Lactic acid results called to secretary on POD A for RN

## 2013-03-11 NOTE — Progress Notes (Addendum)
Addendum 3:30 pm: Dr Mahala Menghini just finished speaking with family at bedside; Clinical research associate spoke with daughter Dennie Bible and her aunt at bedside- Dennie Bible informs her sister will arrive from [place] tomorrow and at this time want to wait and see how patient does overnight; Dennie Bible acknowledges that her father's condition is very fragile and is aware he may not survive the night- she asks Clinical research associate to contact her uncle Chrissie Noa 'Colin Benton' Bohorquez who is HCPOA to confirm time; Clinical research associate spoke directly with Colin Benton [phone]) and he is in agreement and can be present at meeting tomorrow at 2:00 pm  Per family request PMT meeting tomorrow, Wednesday 7/16 @ 2:00 pm Valente David, RN    Palliative Medicine Team consult for goals of care, EOL issues requested by Dr Mahala Menghini; patient seen in ED to be transferred to 3300; pt minimally responsive, on NRBM, rigors, tachycardic,systolic BP =86; some extremity mottling noted- patient's daughter Sarajane Jews at bedside stated her uncle is HCPOA and she has been speaking with him and has notified other family who are spread throughout the country; daughter tearful emotional support given, chaplain has visited with daughter; Clinical research associate contacted patient's brother/POA Jamaurion Slemmer [phone] he is agreeable to speaking via phone with PMT provider and daughter Dennie Bible will also participate in discussion   Valente David, RN 03/11/2013, 2:54 PM Palliative Medicine Team RN Liaison [phone]

## 2013-03-11 NOTE — Significant Event (Signed)
Had a long discussion w/ brother Chrissie Noa). The pt is in Septic shock/MODS. Currently in acute renal failure, metabolic acidosis, and respiratory failure. Has multiple co-morbids, including severe dementia. Have told him that we do not think addition of ACLS measures would change outcome and will continue with DNR status, but also will not offer escalating treatment beyond IVFs, antibiotics and treatment of fever. He was in agreement with this plan. I have told him that he may not survive thru the evening he stated he has been anticipating this day. Would recommend admitting for IVFs, abx and fever treatment. Would not escalate beyond this.   Anders Simmonds ACNP-BC [facility] Pulmonary/Critical Care Pager # [phone] OR # [phone] if no answer

## 2013-03-11 NOTE — ED Provider Notes (Signed)
Medical screening examination/treatment/procedure(s) were conducted as a shared visit with non-physician practitioner(s) and myself.  I personally evaluated the patient during the encounter  Subjective: Patient here from nursing home with fever and altered mental status.  Objective: Patient afebrile. Rhonchi noted a on exam. Also hypotensive.  Assessment/plan: Code sepsis initiated. Patient given IV fluids and started on antibiotics. I spoke with the patient's medical power of attorney, his son, he has agreed to make the patient a DO NOT RESUSCITATE.  Toy Baker, MD 03/11/13 [phone]

## 2013-03-11 NOTE — ED Notes (Signed)
Called report to danielle

## 2013-03-11 NOTE — Progress Notes (Signed)
Responded to page while in ED to provide emotional and spiritual support to Pt. Daughter at bedside.  I was informed by pt.'s nurse that the pt is experiencing Septic shock and other major medical issues and  is actively dying. Per requested of daughter I prayed with pt.and daughter, shared words of comfort and provided anticipatory grief support. The daughter indicated that most of their family was scattered all over the country but several have been notified of the pt.'s condition. Promoted information sharing between staff and family. Pt. going to [id number].  Passed on to unit Chaplain as referral for follow up.   03/11/13 1400  Clinical Encounter Type  Visited With Patient and family together;Health care provider  Visit Type Spiritual support;ED;Patient actively dying  Referral From Nurse  Consult/Referral To Chaplain  Spiritual Encounters  Spiritual Needs Prayer;Emotional;Grief support  Stress Factors  Family Stress Factors Exhausted  Venida Jarvis, [address] [phone]

## 2013-03-11 NOTE — ED Notes (Signed)
Patient is making moaning noises and smells strongly of urine. He is very hot to touch.

## 2013-03-12 DIAGNOSIS — R7881 Bacteremia: Secondary | ICD-10-CM

## 2013-03-12 DIAGNOSIS — J449 Chronic obstructive pulmonary disease, unspecified: Secondary | ICD-10-CM

## 2013-03-12 DIAGNOSIS — R6520 Severe sepsis without septic shock: Secondary | ICD-10-CM

## 2013-03-12 DIAGNOSIS — R0600 Dyspnea, unspecified: Secondary | ICD-10-CM

## 2013-03-12 DIAGNOSIS — Z515 Encounter for palliative care: Secondary | ICD-10-CM

## 2013-03-12 DIAGNOSIS — J9600 Acute respiratory failure, unspecified whether with hypoxia or hypercapnia: Secondary | ICD-10-CM

## 2013-03-12 DIAGNOSIS — IMO0001 Reserved for inherently not codable concepts without codable children: Secondary | ICD-10-CM

## 2013-03-12 DIAGNOSIS — A419 Sepsis, unspecified organism: Secondary | ICD-10-CM

## 2013-03-12 LAB — GLUCOSE, CAPILLARY

## 2013-03-12 LAB — COMPREHENSIVE METABOLIC PANEL
ALT: 8 U/L (ref 0–53)
Alkaline Phosphatase: 60 U/L (ref 39–117)
CO2: 15 mEq/L — ABNORMAL LOW (ref 19–32)
GFR calc Af Amer: 23 mL/min — ABNORMAL LOW (ref >90)
GFR calc non Af Amer: 19 mL/min — ABNORMAL LOW (ref >90)
Glucose, Bld: 117 mg/dL — ABNORMAL HIGH (ref 70–99)
Potassium: 4.1 mEq/L (ref 3.5–5.1)
Sodium: 150 mEq/L — ABNORMAL HIGH (ref 135–145)
Total Protein: 5.3 g/dL — ABNORMAL LOW (ref 6.0–8.3)

## 2013-03-12 LAB — CBC
HCT: 27.6 % — ABNORMAL LOW (ref 39.0–52.0)
MCHC: 32.6 g/dL (ref 30.0–36.0)
RDW: 15.4 % (ref 11.5–15.5)

## 2013-03-12 LAB — LACTIC ACID, PLASMA: Lactic Acid, Venous: 3.2 mmol/L — ABNORMAL HIGH (ref 0.5–2.2)

## 2013-03-12 LAB — PROTIME-INR: INR: 1.84 — ABNORMAL HIGH (ref 0.00–1.49)

## 2013-03-12 MED ORDER — LEVOTHYROXINE SODIUM 100 MCG IV SOLR
60.0000 ug | Freq: Every day | INTRAVENOUS | Status: DC
  Administered 2013-03-12 – 2013-03-24 (×13): 60 ug via INTRAVENOUS
  Filled 2013-03-12 (×15): qty 5

## 2013-03-12 MED ORDER — MORPHINE SULFATE 2 MG/ML IJ SOLN
1.0000 mg | Freq: Once | INTRAMUSCULAR | Status: AC
  Administered 2013-03-12: 1 mg via INTRAVENOUS

## 2013-03-12 MED ORDER — MORPHINE SULFATE 2 MG/ML IJ SOLN
INTRAMUSCULAR | Status: AC
  Filled 2013-03-12: qty 1

## 2013-03-12 MED ORDER — SODIUM CHLORIDE 0.45 % IV SOLN
INTRAVENOUS | Status: DC
  Administered 2013-03-12 – 2013-03-14 (×5): via INTRAVENOUS

## 2013-03-12 MED ORDER — SODIUM CHLORIDE 0.9 % IJ SOLN: 3.0000 mL | INTRAMUSCULAR | Status: DC | PRN

## 2013-03-12 MED ORDER — MORPHINE SULFATE 2 MG/ML IJ SOLN
1.0000 mg | INTRAMUSCULAR | Status: DC | PRN
  Administered 2013-03-12 – 2013-03-13 (×3): 1 mg via INTRAVENOUS
  Filled 2013-03-12 (×3): qty 1

## 2013-03-12 MED ORDER — LORAZEPAM 2 MG/ML IJ SOLN: 0.5000 mg | INTRAMUSCULAR | Status: DC | PRN

## 2013-03-12 MED ORDER — SODIUM CHLORIDE 0.45 % IV BOLUS
250.0000 mL | Freq: Once | INTRAVENOUS | Status: AC
  Administered 2013-03-12: 250 mL via INTRAVENOUS

## 2013-03-12 MED ORDER — INSULIN ASPART 100 UNIT/ML ~~LOC~~ SOLN
0.0000 [IU] | SUBCUTANEOUS | Status: DC
  Administered 2013-03-15 (×2): 2 [IU] via SUBCUTANEOUS
  Administered 2013-03-16: 1 [IU] via SUBCUTANEOUS

## 2013-03-12 MED ORDER — LORAZEPAM 2 MG/ML IJ SOLN
0.5000 mg | Freq: Once | INTRAMUSCULAR | Status: AC
  Administered 2013-03-12: 0.5 mg via INTRAVENOUS

## 2013-03-12 MED ORDER — SODIUM CHLORIDE 0.9 % IJ SOLN
INTRAMUSCULAR | Status: AC
  Filled 2013-03-12: qty 10

## 2013-03-12 MED ORDER — LORAZEPAM 2 MG/ML IJ SOLN
INTRAMUSCULAR | Status: AC
  Filled 2013-03-12: qty 1

## 2013-03-12 NOTE — Progress Notes (Signed)
Nutrition Brief Note  Patient identified on the Malnutrition Screening Tool (MST) Report for unsure of weight loss. Patient has lost 4 lb in 3 months (2% of usual weight), which is not significant.   Body mass index is 26.23 kg/(m^2). Patient meets criteria for overweight based on current BMI.   Current diet order is NPO. Palliative Care Team to meet with family today to discuss EOL issues and goals of care. Per Physicians' notes, no plans to escalate care. No nutrition interventions warranted at this time. If nutrition issues arise, please consult RD.    Joaquin Courts, RD, LDN, CNSC Pager [phone] After Hours Pager [phone]

## 2013-03-12 NOTE — Progress Notes (Signed)
Palliative Medicine Team SW Received referral for psychosocial support on 7/15; At that time, family and medical team had decided to watch pt through the night and have GOC discussion today. Family had already received chaplain visit. Met with family this am; family report good emotional coping, efforts for self-care. Family report pt much more peaceful this morning, had episodes of restlessness overnight. GOC scheduled for 2pm, will continue to be available for psychosocial support as needed.  Kennieth Francois, LCSW PMT phone [phone] Pager [phone]

## 2013-03-12 NOTE — Consult Note (Signed)
I have reviewed and discussed the care of this patient in detail with the nurse practitioner including pertinent patient records, physical exam findings and data. I agree with details of this encounter.  

## 2013-03-12 NOTE — Progress Notes (Signed)
[facility] Progress Note [place] TEAM 1 - Stepdown/ICU TEAM   [name]:[id number] DOB: Aug 04, 1915 DOA: 03/11/2013 PCP: Lenoard Aden, NP  Brief narrative: 79 y.o. 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   At baseline the pt is total care and not oriented. The only thing he can do is feed himself.   Assessment/Plan:  Sepsis / septic shock BP has improved w/ volume resuscitation - slow IVF as pt is developing crackles/congestion  Acute hypoxic resp failure Due to septic shock - continues to require high flow O2 support - slow IVF - adjust O2 support as necessary   Gram negative rod Pyelonephritis/UTI Grossly positive UA in pt w/ indwelling foley cath - cont empiric abx for now - await cx data - d/c vancomycin (MRSA negative + gram neg rods on cx)  Acute renal failure Due to hypoperfusion +/- ATN - renal function has worsened since admit - follow trend - must slow IVF due to pulm congestion - foley was repositioned after results of CT suggesting it was not in bladder  Metabolic Acidosis - lactic acidosis Due to acute renal failure + sepsis - lactate has normalized  Chronic Afib Rate controlled at the present time  DM Follow CBG w/ SSI   CAD  MVR s/p annuloplasty  Dementia  Hx of HTN Not an active problem at this time  COPD Well compensated at present   BPH w/ chronic foley cath CT suggested foley was not in bladder - cath was repositioned by RN  Hypothyroidism Synthroid via IV  Overweight BMI is 26.23 kg/(m^2)   Code Status: NO CODE Family Communication: spoke w/ multiple family members at the bedside at length Disposition Plan: awaiting decisions from  family meeting with Palliative Care  Consultants: Palliative Care  Procedures: none  Antibiotics: 7/15 Vancomycin >> 7/16 7/15 Zosyn >>>  DVT prophylaxis: SQ heparin  HPI/Subjective: The patient is not able to provide a history due to toxic metabolic encephalopathy.  I had an extended discussion with the patient's family at the bedside.  Objective: Blood pressure 122/51, pulse 46, temperature 98.5 F (36.9 C), temperature source Oral, resp. rate 43, height 5' 6.93" (1.7 m), weight 75.8 kg (167 lb 1.7 oz), SpO2 100.00%.  Intake/Output Summary (Last 24 hours) at 03/12/13 1435 Last data filed at 03/12/13 1300  Gross per 24 hour  Intake   2350 ml  Output    490 ml  Net   1860 ml   Exam: General: mild resp distress marked by some upper airway gurgling Lungs: faint fine crackles th/o all fields - no wheeze Cardiovascular: Regular rate and rhythm without murmur gallop or rub  Abdomen: Nontender, nondistended, soft, bowel sounds positive, no rebound, no ascites, no appreciable mass Extremities: No significant cyanosis, clubbing, or edema bilateral lower extremities  Data Reviewed: Basic Metabolic Panel:  Recent Labs Lab 03/11/13 1045 03/11/13 1631 03/12/13 0135  NA 147*  --  150*  K 4.5  --  4.1  CL 114*  --  121*  CO2 14*  --  15*  GLUCOSE 189*  --  117*  BUN 42*  --  48*  CREATININE 2.15* 2.22* 2.58*  CALCIUM 9.2  --  7.7*   Liver Function Tests:  Recent Labs Lab 03/11/13 1045 03/12/13 0135  AST 15 22  ALT 8 8  ALKPHOS 89 60  BILITOT 0.6 0.4  PROT 6.8 5.3*  ALBUMIN 3.0* 2.2*   CBC:  Recent Labs Lab 03/11/13 1045 03/11/13 1631 03/12/13 0135  WBC 1.8* 18.1* 18.5*  NEUTROABS 1.6*  --   --   HGB 12.2* 10.6* 9.0*  HCT 37.8* 34.0* 27.6*  MCV 92.2 95.8 92.6  PLT 135* 127* 115*    Recent Results (from the past 240 hour(s))  CULTURE, BLOOD (ROUTINE X 2)     Status: None   Collection Time    03/11/13 10:45 AM      Result Value Range Status    Specimen Description BLOOD RIGHT FOREARM   Final   Special Requests BOTTLES DRAWN AEROBIC AND ANAEROBIC 10CC   Final   Culture  Setup Time 03/11/2013 16:28   Final   Culture     Final   Value: GRAM NEGATIVE RODS     Note: Gram Stain Report Called to,Read Back By and Verified With: MELISSA HANCOCK 03/12/13 AT 0240 RIDK   Report Status PENDING   Incomplete  URINE CULTURE     Status: None   Collection Time    03/11/13 11:11 AM      Result Value Range Status   Specimen Description URINE, CATHETERIZED   Final   Special Requests NONE   Final   Culture  Setup Time 03/11/2013 12:06   Final   Colony Count >=100,000 COLONIES/ML   Final   Culture GRAM NEGATIVE RODS   Final   Report Status PENDING   Incomplete  CULTURE, BLOOD (ROUTINE X 2)     Status: None   Collection Time    03/11/13 11:55 AM      Result Value Range Status   Specimen Description BLOOD HAND RIGHT   Final   Special Requests BOTTLES DRAWN AEROBIC ONLY [facility]   Final   Culture  Setup Time 03/11/2013 16:28   Final   Culture     Final   Value:        BLOOD CULTURE RECEIVED NO GROWTH TO DATE CULTURE WILL BE HELD FOR 5 DAYS BEFORE ISSUING A FINAL NEGATIVE REPORT   Report Status PENDING   Incomplete  MRSA PCR SCREENING     Status: None   Collection Time    03/11/13  3:58 PM      Result Value Range Status   MRSA by PCR NEGATIVE  NEGATIVE Final   Comment:            The GeneXpert MRSA Assay (FDA     approved for NASAL specimens     only), is one component of a     comprehensive MRSA colonization     surveillance program. It is not     intended to diagnose MRSA     infection nor to guide or     monitor treatment for     MRSA infections.     Studies:  Recent x-ray studies have been reviewed in detail by the Attending Physician  Scheduled Meds:  Scheduled Meds: . heparin  5,000 Units Subcutaneous Q8H  . piperacillin-tazobactam (ZOSYN)  IV  2.25 g Intravenous Q8H    Time spent on care of this patient:    [facility] T  [facility] Office  [phone] Pager - Text Page per Loretha Stapler as per below:  On-Call/Text Page:      [email or web address]  password [email or web address]  If 7PM-7AM, please contact night-coverage [email or web address] Password [email or web address] 03/12/2013, 2:35 PM   LOS: 1 day

## 2013-03-12 NOTE — Progress Notes (Signed)
BP ranging from 80-90s/30-50s. HR 60-80s. RR high 20-low 40s, with abdominal breathing. On non-rebreather sats 100%. Minimal UOP. Patient obtunded only responding to painful stimuli. Kirtland Bouchard Schorr notified, to bedside to assess patient. New orders received. Will continue to monitor.   Rochele Pages, RN

## 2013-03-12 NOTE — Consult Note (Signed)
Patient WU:[id number] JAYSEAN MANVILLE      DOB: February 03, 1915      NWG:[id number]     Consult Note from the Palliative Medicine Team at [facility]    Consult Requested by: Dr Mahala Menghini     PCP: Lenoard Aden, NP Reason for Consultation:Goals of Care     Phone Number:[phone]  Assessment of patients Current state: Patient with eyes closed, unable to verbalize, does respond to touch with moans, currently NPO. Tachypenic/tachycardic at times, Re-breather mask on.  Reviewed chart, spoke with staff caring for patient, Dr Sharon Seller was in room speaking with family prior to PMT meeting, explained to family in depth about patients poor prognosis.  I met with the following family members away from bedside: Melven Sartorius (HPOA), patients brother, Trenton Gammon [facility]) patients daughter, Rennis Petty [facility]), patients daughter, as well as patients sisters, Dewayne Hatch May and Opal Dye.  Per family patient has known dementia for past several years, has resided at [facility] for past 4 years, prior to that patient was very independent living alone caring for all ADL's. Has not been able to converse beyond solitary words or phrases for past several years. I discussed patient decline and quality of life in context of progressing dementia. Daughter Dennie Bible informed me patient stated several months ago, "I am alive, but not living". We discussed the potential for patients ultimate decline and death due to his current serious medical status. Patients brother indicated patient experienced moderate pain in right leg when moved or repositioned for a long time.   Discussed with family the possible levels for care in the context of patients serious condition, based on their goals of care decisions.  I talked with them about support services of both palliative care and hospice care, as well as EOL comfort care approach. Family reluctant to transition patient to full comfort care at this time, decision made to continue current level of care,  and to incorporate the use of medication on an as needed level to provide relieve  of pain or dyspnea.   Family agreeable for Palliative care to follow, and assist with EOL symptoms if decision made to transition patient to full comfort care.     Goals of Care: 1.  Code Status: DNR/DNI   Family decision: Continue current level of limited aggressive management and intervention   2. Scope of Treatment: 1. Vital Signs: per unit protocol  2. Respiratory/Oxygen: support with oxygen, medications-No BIPAP 3. Nutritional Support/Tube Feeds: No 4. Antibiotics: continue current regime 5. Blood Products: No 6. IVF: Yes 7. Review of Medications to be discontinued: Yes 8. Labs: as indicated for monitoring 9. Telemetry: Yes   4. Disposition: anticipate hospital demise   3. Symptom Management:   1. Pain/Dyspnea: Morphine 1-2 mg IV every 3 hours as needed 2. Fever: Tylenol suppository as needed  4. Psychosocial: Patient has resided at [facility] for past 4 years, after experiencing fall at home followed by acute care admission and decline in functional staus  5. Spiritual: family declined spiritual consult   Patient Documents Completed or Given: Document Given Completed  Advanced Directives Pkt    MOST  No  DNR    Gone from My Sight    Hard Choices Yes     Brief HPI: 79 yo WM, with PMH of Dementia, A-fib, HTN, DM, COPD, chronic foley catheter (2ndary to BPH). Admitted from [facility] SNF on 03/11/13, with chills, fever and SOB. Found to be in septic shock with ARF and AKI.  ROS:  unable to elicit from patient. Appears to experience pain when R leg moved (moans, becomes agitated, tachycardic)    PMH:  Past Medical History  Diagnosis Date  . Unspecified hypothyroidism   . Unspecified hereditary and idiopathic peripheral neuropathy   . Chronic airway obstruction, not elsewhere classified   . Unspecified constipation   . Rash and other nonspecific skin eruption   . Pacemaker    . Hypertension   . Heart murmur   . CHF (congestive heart failure)   . History of duodenal ulcer     [name] 09/06/2009  (03/11/2013)  . Renal insufficiency     [name] 09/06/2009  (03/11/2013)  . Chronic atrial fibrillation     [name] 09/06/2009  (03/11/2013)  . Dysphagia     [name] 09/06/2009  (03/11/2013)  . Phlebitis     "I think he's had it to both legs" (03/11/2013)  . DVT (deep venous thrombosis)     LLE/notes 05/25/2010 (03/11/2013(  . Pneumonia     "a few times"/daughter (03/11/2013)  . Exertional dyspnea   . Type II or unspecified type diabetes mellitus with neurological manifestations, not stated as uncontrolled(250.60)     [name] 05/25/2010 (03/11/2013(  . Stroke     "made him weaker on his left side" (03/11/2013)  . Dementia     severe/notes 05/25/2010 (03/11/2013(  . Arthritis   . Chronic upper back pain   . Depression     "at times; maybe related to the dementia" (03/11/2013)  . UTI (urinary tract infection)     "several at [facility]" (03/11/2013)  . Basal cell carcinoma of ear     left/notes 06/22/2008 (03/11/2013)  . Colon cancer   . Hard of hearing     "both ears" (03/11/2013)     PSH: Past Surgical History  Procedure Laterality Date  . Hernia repair    . Coronary artery bypass graft    . Mitral valve annuloplasty  05/2003    [name] 06/22/2008 (03/11/2013)  . Tonsillectomy      [name] 06/22/2008 (03/11/2013)  . Insert / replace / remove pacemaker      /notes 06/22/2008 (03/11/2013)  . Cataract extraction w/ intraocular lens  implant, bilateral      [name] 06/22/2008 (03/11/2013)  . Colectomy  1995; 1996    partial/notes 06/22/2008 (03/11/2013)  . Basal cell carcinoma excision Left     ear/notes 06/22/2008 (03/11/2013)   I have reviewed the FH and SH and  If appropriate update it with new information. Allergies  Allergen Reactions  . Sulfa Antibiotics Other (See Comments)    unknown   Scheduled Meds: . heparin  5,000 Units Subcutaneous Q8H  . insulin aspart  0-9  Units Subcutaneous Q4H  . levothyroxine  60 mcg Intravenous QAC breakfast  . piperacillin-tazobactam (ZOSYN)  IV  2.25 g Intravenous Q8H   Continuous Infusions: . sodium chloride 50 mL/hr at 03/12/13 1503   PRN Meds:.acetaminophen, morphine injection, sodium chloride    BP 122/51  Pulse 46  Temp(Src) 98.5 F (36.9 C) (Oral)  Resp 43  Ht 5' 6.93" (1.7 m)  Wt 75.8 kg (167 lb 1.7 oz)  BMI 26.23 kg/m2  SpO2 100%   PPS:10%        03/12/13 2.2   Intake/Output Summary (Last 24 hours) at 03/12/13 1558 Last data filed at 03/12/13 1300  Gross per 24 hour  Intake   2350 ml  Output    490 ml  Net   1860 ml   ZOX:WRUEA to admission  7/15                  Physical Exam:  General: Responds only with moans HEENT:  Buccal mucosa dry Chest: fine crackles anteriorly, labored at times CVS: tachycardic Abdomen:soft, BS audible Ext: 1+ ankle edema Neuro: becomes agitated, thrashing UE's/moaning when R leg moved-   Variable  0 Points 1 Point 2 Points Total  Heart rate per minute  <90 beats 90-109 beats >110 beats 1  Respiratory  Rate per minute < 18 breaths 19-30 breaths  >30 breaths 1  Restlessness; nonpurposeful movements None  occas slight movement Frequent movement 1  Paradoxical breathing pattern: None  Present 0  Accessory muscle use: rise in clavicle during inspiration None Slight rise Pronounced rise 1  Grunting at end-expiration: guttural sound None  Present 0  Nasal flaring: involuntary movement of nares None  Present 0  Look of fear None  Eyes wide 0  Overall total out of 16    4/16    Respiratory Distress Observation Scale Journal of Palliative Medicine Vol. 13, Number 3, 2010 Campbell et al. Page. 285-290 Labs: CBC    Component Value Date/Time   WBC 18.5* 03/12/2013 0135   RBC 2.98* 03/12/2013 0135   HGB 9.0* 03/12/2013 0135   HCT 27.6* 03/12/2013 0135   PLT 115* 03/12/2013 0135   MCV 92.6 03/12/2013 0135   MCH 30.2 03/12/2013 0135   MCHC 32.6 03/12/2013 0135    RDW 15.4 03/12/2013 0135   LYMPHSABS 0.2* 03/11/2013 1045   MONOABS 0.0* 03/11/2013 1045   EOSABS 0.0 03/11/2013 1045   BASOSABS 0.0 03/11/2013 1045    BMET    Component Value Date/Time   NA 150* 03/12/2013 0135   K 4.1 03/12/2013 0135   CL 121* 03/12/2013 0135   CO2 15* 03/12/2013 0135   GLUCOSE 117* 03/12/2013 0135   BUN 48* 03/12/2013 0135   CREATININE 2.58* 03/12/2013 0135   CALCIUM 7.7* 03/12/2013 0135   GFRNONAA 19* 03/12/2013 0135   GFRAA 23* 03/12/2013 0135    CMP     Component Value Date/Time   NA 150* 03/12/2013 0135   K 4.1 03/12/2013 0135   CL 121* 03/12/2013 0135   CO2 15* 03/12/2013 0135   GLUCOSE 117* 03/12/2013 0135   BUN 48* 03/12/2013 0135   CREATININE 2.58* 03/12/2013 0135   CALCIUM 7.7* 03/12/2013 0135   PROT 5.3* 03/12/2013 0135   ALBUMIN 2.2* 03/12/2013 0135   AST 22 03/12/2013 0135   ALT 8 03/12/2013 0135   ALKPHOS 60 03/12/2013 0135   BILITOT 0.4 03/12/2013 0135   GFRNONAA 19* 03/12/2013 0135   GFRAA 23* 03/12/2013 0135    Chest Xray Reviewed/Impressions:03/11/13 IMPRESSION:  1. Low lung volumes with probable subsegmental atelectasis in the  left lower lobe.  2. Mild cardiomegaly with pulmonary venous congestion.  3. Atherosclerosis.  4. Postoperative changes and support apparatus, as above  Time In Time Out Total Time Spent with Patient Total Overall Time  2:00p 4:00p 20 min 120 min    Greater than 50%  of this time was spent counseling and coordinating care related to the above assessment and plan.  Freddie Breech, CNS-C Palliative Medicine Team [facility] Team Phone: [phone] Pager: [phone]

## 2013-03-12 NOTE — ED Provider Notes (Signed)
Medical screening examination/treatment/procedure(s) were performed by non-physician practitioner and as supervising physician I was immediately available for consultation/collaboration.  Giah Fickett T Britanie Harshman, MD 03/12/13 1532 

## 2013-03-13 ENCOUNTER — Inpatient Hospital Stay (HOSPITAL_COMMUNITY): Payer: Medicare Other

## 2013-03-13 DIAGNOSIS — R0989 Other specified symptoms and signs involving the circulatory and respiratory systems: Secondary | ICD-10-CM

## 2013-03-13 DIAGNOSIS — M79609 Pain in unspecified limb: Secondary | ICD-10-CM

## 2013-03-13 DIAGNOSIS — Z515 Encounter for palliative care: Secondary | ICD-10-CM

## 2013-03-13 LAB — CBC
HCT: 29.6 % — ABNORMAL LOW (ref 39.0–52.0)
MCH: 29.7 pg (ref 26.0–34.0)
MCHC: 32.4 g/dL (ref 30.0–36.0)
RDW: 15.6 % — ABNORMAL HIGH (ref 11.5–15.5)

## 2013-03-13 LAB — BASIC METABOLIC PANEL
BUN: 61 mg/dL — ABNORMAL HIGH (ref 6–23)
Chloride: 119 mEq/L — ABNORMAL HIGH (ref 96–112)
Creatinine, Ser: 2.78 mg/dL — ABNORMAL HIGH (ref 0.50–1.35)
GFR calc Af Amer: 21 mL/min — ABNORMAL LOW (ref >90)
GFR calc non Af Amer: 18 mL/min — ABNORMAL LOW (ref >90)
Glucose, Bld: 96 mg/dL (ref 70–99)

## 2013-03-13 LAB — GLUCOSE, CAPILLARY
Glucose-Capillary: 96 mg/dL (ref 70–99)
Glucose-Capillary: 84 mg/dL (ref 70–99)
Glucose-Capillary: 103 mg/dL — ABNORMAL HIGH (ref 70–99)
Glucose-Capillary: 104 mg/dL — ABNORMAL HIGH (ref 70–99)

## 2013-03-13 LAB — URINE CULTURE: Colony Count: >=100000

## 2013-03-13 MED ORDER — DEXTROSE 5 % IV SOLN
1.0000 g | INTRAVENOUS | Status: DC
  Administered 2013-03-13 – 2013-03-20 (×8): 1 g via INTRAVENOUS
  Filled 2013-03-13 (×9): qty 10

## 2013-03-13 NOTE — Progress Notes (Signed)
Clinical Social Work Department BRIEF PSYCHOSOCIAL ASSESSMENT 03/13/2013  Patient:  [name], [name]     Account Number:  [id number]     Admit date:  03/11/2013  Clinical Social Worker:  Leron Croak, CLINICAL SOCIAL WORKER  Date/Time:  03/13/2013 10:39 AM  Referred by:  Physician  Date Referred:  03/13/2013 Referred for  SNF Placement   Other Referral:   Interview type:  Family Other interview type:   Pt's sister and daughter were in the room.    PSYCHOSOCIAL DATA Living Status:  FACILITY Admitted from facility:  [facility] Level of care:  Skilled Nursing Facility Primary support name:  [name]    [phone] [facility] Primary support relationship to patient:  S[place] Degree of support available:   Pt has a good support system from family and facility    CURRENT CONCERNS Current Concerns  Post-Acute Placement   Other Concerns:    SOCIAL WORK ASSESSMENT / PLAN CSW met with Pt's sister and daughter at the bedside. Pt was unable to speak with CSW at this time. Pt's family verified that Pt is from [facility] and Pt does want to go back to facility if able to d/c. Pt's family have concerns that Pt will be able to return and want to "consider transferring back to facility if Pt's status improves." Pt's family stated that Thelma Comp  [phone] Hm)  is the [facility] and will make the final decision. CSW will prepare Pt information in preparation for Pt to return to facility after this admission.   Assessment/plan status:  Information/Referral to [facility] Other assessment/ plan:   Information/referral to community resources:   No additional information is needed at this time.    PATIENT'S/FAMILY'S RESPONSE TO PLAN OF CARE: Pt's family was appreciative for assistance and will contact CSW if any additional assistance is needed during admission.       Leron Croak, LCSWA [facility]  [phone]

## 2013-03-13 NOTE — Progress Notes (Addendum)
[facility] PROGRESS NOTE  [name] QIO:[id number] DOB: 07-21-1915 DOA: 03/11/2013 PCP: Lenoard Aden, NP  Assessment/Plan: Active Problems:   Palliative care encounter   Pain in right extremity   Dyspnea   Brief narrative:  79 y.o. male w/ known Dementia, Afib w/ CHADS2Vasc~5, CAD, MVR S/p annuloplasty 06/2002, colon cancer x 2, PPM, HTN, DM, COPD, BPH w/ chronic indwelling foley and elvated PSA, who was brought over from [facility] 03/11/13 after he was found to have a temp of 102.9 associated w/ tachycardia and O2 sats 85% on 4 L.  At baseline the pt is total care and not oriented. The only thing he can do is feed himself.    Assessment/Plan:  Sepsis / septic shock  BP has improved w/ volume resuscitation - slow IVF as pt is developing crackles/congestion  Acute hypoxic resp failure  Due to septic shock - continues to require high flow O2 support - slow IVF - adjust O2 support as necessary  Gram negative rod Pyelonephritis/UTI  Grossly positive UA in pt w/ indwelling foley cath - Urine culture shows retention sensitive to Rocephin, DC Zosyn - d/c vancomycin (MRSA negative + gram neg rods on cx)  Acute renal failure  Due to hypoperfusion +/- ATN - renal function has worsened since admit - follow trend - must slow IVF due to pulm congestion - foley was repositioned after results of CT suggesting it was not in bladder   Metabolic Acidosis - lactic acidosis  Due to acute renal failure + sepsis - lactate has normalized  Chronic Afib  Rate controlled at the present time  DM  Follow CBG w/ SSI  CAD  MVR s/p annuloplasty  Dementia  Hx of HTN  Not an active problem at this time  COPD  Well compensated at present  BPH w/ chronic foley cath  CT suggested foley was not in bladder - cath was repositioned by RN  Hypothyroidism  Synthroid via IV  Overweight  BMI is 26.23 kg/(m^2)    Code Status: NO CODE  Family Communication: spoke w/ multiple  family members at the bedside at length  Disposition Plan: Transferred to MedSurg, status post palliative care consultation, DNR/DNI, continue current antibiotic regimen Family understands the risk of aspiration and wants to start a diet after slp   Consultants:  Palliative Care  Procedures:  none  Antibiotics:  7/15 Vancomycin >> 7/16  7/15 Zosyn >>>  DVT prophylaxis:  SQ heparin  HPI/Subjective:  The patient is not able to provide a history due to toxic metabolic encephalopathy. I had an extended discussion with the patient's family at the bedside.  Objective: Filed Vitals:   03/12/13 2009 03/12/13 2352 03/13/13 0409 03/13/13 0825  BP: 144/83 105/53 127/59 147/97  Pulse: 68 69 72 84  Temp: 97.6 F (36.4 C) 97.6 F (36.4 C) 97.5 F (36.4 C) 98.1 F (36.7 C)  TempSrc: Axillary Axillary Axillary Axillary  Resp: 19 28 25 23   Height:      Weight:      SpO2: 100% 94% 100% 100%    Intake/Output Summary (Last 24 hours) at 03/13/13 1106 Last data filed at 03/13/13 0700  Gross per 24 hour  Intake 1652.5 ml  Output    650 ml  Net 1002.5 ml    Exam:  General: mild resp distress marked by some upper airway gurgling  Lungs: faint fine crackles th/o all fields - no wheeze  Cardiovascular: Regular rate and rhythm without murmur gallop or rub  Abdomen: Nontender, nondistended, soft, bowel sounds positive, no rebound, no ascites, no appreciable mass  Extremities: No significant cyanosis, clubbing, or edema bilateral lower extremities    Data Reviewed: Basic Metabolic Panel:  Recent Labs Lab 03/11/13 1045 03/11/13 1631 03/12/13 0135 03/13/13 0720  NA 147*  --  150* 147*  K 4.5  --  4.1 4.7  CL 114*  --  121* 119*  CO2 14*  --  15* 14*  GLUCOSE 189*  --  117* 96  BUN 42*  --  48* 61*  CREATININE 2.15* 2.22* 2.58* 2.78*  CALCIUM 9.2  --  7.7* 8.1*    Liver Function Tests:  Recent Labs Lab 03/11/13 1045 03/12/13 0135  AST 15 22  ALT 8 8  ALKPHOS 89 60   BILITOT 0.6 0.4  PROT 6.8 5.3*  ALBUMIN 3.0* 2.2*   No results found for this basename: LIPASE, AMYLASE,  in the last 168 hours No results found for this basename: AMMONIA,  in the last 168 hours  CBC:  Recent Labs Lab 03/11/13 1045 03/11/13 1631 03/12/13 0135 03/13/13 0720  WBC 1.8* 18.1* 18.5* 10.6*  NEUTROABS 1.6*  --   --   --   HGB 12.2* 10.6* 9.0* 9.6*  HCT 37.8* 34.0* 27.6* 29.6*  MCV 92.2 95.8 92.6 91.6  PLT 135* 127* 115* 89*    Cardiac Enzymes: No results found for this basename: CKTOTAL, CKMB, CKMBINDEX, TROPONINI,  in the last 168 hours BNP (last 3 results) No results found for this basename: PROBNP,  in the last 8760 hours   CBG:  Recent Labs Lab 03/12/13 1644 03/12/13 2007 03/12/13 2350 03/13/13 0409 03/13/13 0819  GLUCAP 96 106* 96 92 74    Recent Results (from the past 240 hour(s))  CULTURE, BLOOD (ROUTINE X 2)     Status: None   Collection Time    03/11/13 10:45 AM      Result Value Range Status   Specimen Description BLOOD RIGHT FOREARM   Final   Special Requests BOTTLES DRAWN AEROBIC AND ANAEROBIC 10CC   Final   Culture  Setup Time 03/11/2013 16:28   Final   Culture     Final   Value: GRAM NEGATIVE RODS     Note: Gram Stain Report Called to,Read Back By and Verified With: MELISSA HANCOCK 03/12/13 AT 0240 RIDK   Report Status PENDING   Incomplete  URINE CULTURE     Status: None   Collection Time    03/11/13 11:11 AM      Result Value Range Status   Specimen Description URINE, CATHETERIZED   Final   Special Requests NONE   Final   Culture  Setup Time 03/11/2013 12:06   Final   Colony Count >=100,000 COLONIES/ML   Final   Culture PROVIDENCIA STUARTII   Final   Report Status 03/13/2013 FINAL   Final   Organism ID, Bacteria PROVIDENCIA STUARTII   Final  CULTURE, BLOOD (ROUTINE X 2)     Status: None   Collection Time    03/11/13 11:55 AM      Result Value Range Status   Specimen Description BLOOD HAND RIGHT   Final   Special Requests  BOTTLES DRAWN AEROBIC ONLY [facility]   Final   Culture  Setup Time 03/11/2013 16:28   Final   Culture     Final   Value:        BLOOD CULTURE RECEIVED NO GROWTH TO DATE CULTURE WILL BE HELD FOR 5 DAYS  BEFORE ISSUING A FINAL NEGATIVE REPORT   Report Status PENDING   Incomplete  MRSA PCR SCREENING     Status: None   Collection Time    03/11/13  3:58 PM      Result Value Range Status   MRSA by PCR NEGATIVE  NEGATIVE Final   Comment:            The GeneXpert MRSA Assay (FDA     approved for NASAL specimens     only), is one component of a     comprehensive MRSA colonization     surveillance program. It is not     intended to diagnose MRSA     infection nor to guide or     monitor treatment for     MRSA infections.     Studies: Ct Abdomen Pelvis Wo Contrast  03/11/2013   *RADIOLOGY REPORT*  Clinical Data: Abdominal pain and fever.  CT ABDOMEN AND PELVIS WITHOUT CONTRAST  Technique:  Multidetector CT imaging of the abdomen and pelvis was performed following the standard protocol without intravenous contrast.  Comparison:  12/08/2009  Findings: Examination is limited by patient motion.  The lung bases demonstrate bibasilar atelectasis.  The heart is enlarged.  The liver is grossly normal.  The spleen is intact.  The pancreas and adrenal glands are grossly normal.  The left kidney demonstrates  simple cysts.  No hydronephrosis.  The stomach, duodenum, small bowel and colon are grossly normal without oral contrast.  There is a large amount of stool in the rectum suggesting fecal impaction.  No mesenteric or retroperitoneal mass, adenopathy, hematoma or abscess.  The aorta demonstrates advanced atherosclerotic calcifications.  An IVC filter is in place.  There is a large amount of air in the bladder along with some layering high attenuation material which could be blood.  The Foley catheter balloon is in the prostate gland and not in the bladder. The Foley catheter needs to be repositioned.  The bony  structures are unremarkable.  There is advanced osteoporosis.  No destructive bony changes.  IMPRESSION:  1.  The Foley catheter is not in the bladder.  The balloon is in the prostate gland.  This needs to be repositioned. 2.  The bladder is distended.  It contains air and probable hemorrhage. 3.  No other significant intra-abdominal/intrapelvic abnormalities but the study is quite limited without contrast and due to patient motion.   Original Report Authenticated By: Rudie Meyer, M.D.   Dg Chest Portable 1 View  03/11/2013   *RADIOLOGY REPORT*  Clinical Data: Fever.  PORTABLE CHEST - 1 VIEW  Comparison: Chest x-ray 06/14/2010.  Findings: Linear opacity at the left base favored to represent subsegmental atelectasis (underlying air space consolidation is difficult to entirely exclude).  No definite pleural effusions. Pulmonary venous congestion, without frank pulmonary edema.  Heart size appears mildly enlarged. The patient is rotated to the left on today's exam, resulting in distortion of the mediastinal contours and reduced diagnostic sensitivity and specificity for mediastinal pathology.  Atherosclerosis in the thoracic aorta.  Left-sided pacemaker device in place with lead tip projecting over the expected location of the right ventricular apex.  Status post median sternotomy.  IMPRESSION: 1.  Low lung volumes with probable subsegmental atelectasis in the left lower lobe. 2.  Mild cardiomegaly with pulmonary venous congestion. 3.  Atherosclerosis. 4.  Postoperative changes and support apparatus, as above.   Original Report Authenticated By: Trudie Reed, M.D.    Scheduled Meds: . heparin  5,000 Units Subcutaneous Q8H  . insulin aspart  0-9 Units Subcutaneous Q4H  . levothyroxine  60 mcg Intravenous QAC breakfast  . piperacillin-tazobactam (ZOSYN)  IV  2.25 g Intravenous Q8H   Continuous Infusions: . sodium chloride 50 mL/hr at 03/13/13 1610    Active Problems:   Palliative care encounter    Pain in right extremity   Dyspnea    Time spent: 40 minutes   [facility]  [facility] Pager [phone]. If 8PM-8AM, please contact night-coverage at [email or web address], password [facility] 03/13/2013, 11:06 AM  LOS: 2 days

## 2013-03-13 NOTE — Progress Notes (Addendum)
Patient [name]      DOB: June 17, 1915      UXL:[id number]   Palliative Medicine Team at [facility] Progress Note    Subjective: Patient sitting up in bed, re-breather O2 mask on, family at bedside. Family (daughter/sisters) feel patient has shown notable improvement over past 24 hours. Patient now taking ice chips orally per SLP recommendations.   V/S: 97.8-79-23 B/P 147/97    Physical exam: General: Non-verbal during assessment, appears comfortable, no tremors HEENT: dentition poor, buccal mucosa moist CHEST:fine crackles, diminished at L base CVS: RRR ABD: soft, non-tender, BS hypoactive NEURO: alert, non-verbal   Assessment and plan: Patient more alert today, family want to continue current level of care, they are hopeful for continuing improvement.  Code Status: DNR/DNI 1. Pain/Dyspnea: Morphine 1-2 mg IV every 3 hours as needed-2 doses in past 24 hours->tolerated by patient B/P mainatained 2. Fever: Tylenol suppository as needed 3. Disposition: plan per attending to transfer to Med/Surg unit-will discuss with family option of palliative Care services to follow with discharge back SNF  Time In Time Out Total Time Spent with Patient Total Overall Time  3:00p 3:20p 20 min 20 min    Freddie Breech, CNS-C Palliative Medicine Team [facility] Team Phone: [phone] Pager: [phone]

## 2013-03-13 NOTE — Progress Notes (Signed)
Palliative Medicine Team SW Psychosocial follow up with pt, dtrs, and sister at bedside. Family in good spirits over their perceived "great improvement" of pt's condition. Pt more alert today, eyes open, remains nonverbal. Family report hope for continued improvement. Family have had their pastor visit as well as a supportive visit from Chaplain when pt was admitted; family made aware of continued chaplain availability. Family declined offers for music or refreshments, report good coping for the time being. Family have my contact info, available as needed.   Kennieth Francois, LCSW PMT Phone [phone] Pager [phone]

## 2013-03-13 NOTE — Evaluation (Signed)
Clinical/Bedside Swallow Evaluation Patient Details  Name: [name] MRN: [id number] Date of Birth: 07-Oct-1914  Today's Date: 03/13/2013 Time: 1230-1250 SLP Time Calculation (min): 20 min  Past Medical History:  Past Medical History  Diagnosis Date  . Unspecified hypothyroidism   . Unspecified hereditary and idiopathic peripheral neuropathy   . Chronic airway obstruction, not elsewhere classified   . Unspecified constipation   . Rash and other nonspecific skin eruption   . Pacemaker   . Hypertension   . Heart murmur   . CHF (congestive heart failure)   . History of duodenal ulcer     [name] 09/06/2009  (03/11/2013)  . Renal insufficiency     [name] 09/06/2009  (03/11/2013)  . Chronic atrial fibrillation     [name] 09/06/2009  (03/11/2013)  . Dysphagia     [name] 09/06/2009  (03/11/2013)  . Phlebitis     "I think he's had it to both legs" (03/11/2013)  . DVT (deep venous thrombosis)     LLE/notes 05/25/2010 (03/11/2013(  . Pneumonia     "a few times"/daughter (03/11/2013)  . Exertional dyspnea   . Type II or unspecified type diabetes mellitus with neurological manifestations, not stated as uncontrolled(250.60)     [name] 05/25/2010 (03/11/2013(  . Stroke     "made him weaker on his left side" (03/11/2013)  . Dementia     severe/notes 05/25/2010 (03/11/2013(  . Arthritis   . Chronic upper back pain   . Depression     "at times; maybe related to the dementia" (03/11/2013)  . UTI (urinary tract infection)     "several at [facility]" (03/11/2013)  . Basal cell carcinoma of ear     left/notes 06/22/2008 (03/11/2013)  . Colon cancer   . Hard of hearing     "both ears" (03/11/2013)   Past Surgical History:  Past Surgical History  Procedure Laterality Date  . Hernia repair    . Coronary artery bypass graft    . Mitral valve annuloplasty  05/2003    [name] 06/22/2008 (03/11/2013)  . Tonsillectomy      [name] 06/22/2008 (03/11/2013)  . Insert / replace / remove pacemaker      /notes  06/22/2008 (03/11/2013)  . Cataract extraction Wyatt/ intraocular lens  implant, bilateral      [name] 06/22/2008 (03/11/2013)  . Colectomy  1995; 1996    partial/notes 06/22/2008 (03/11/2013)  . Basal cell carcinoma excision Left     ear/notes 06/22/2008 (03/11/2013)   HPI:  79 y.o. male Wyatt/ known dementia, Afib Wyatt/ CHADS2Vasc~5, CAD, MVR S/p annuloplasty 06/2002, colon cancer x 2, PPM, HTN, DM, COPD, BPH Wyatt/ chronic indwelling foley and elvated PSA, who was brought over from [facility] 03/11/13 after he was found to have a temp of 102.9 associated Wyatt/ tachycardia and O2 sats 85% on 4 L.  Dx of sepsis, hypoxic resp failure.  Now on non-rebreather.  Palliative medicine met with family yesterday; they made decision made to continue current level of care, and to incorporate the use of medication on an as needed level to provide relieve  of pain or dyspnea.   Swallow eval ordered to determine safest PO diet as family does not want to pursue enteral feeding.  Assessment / Plan / Recommendation Clinical Impression  Pt able to participate in limited swallow assessment.  At this time, he is on non-rebreather; saturating around 90%.  He consumed ice chips with reduced bolus recognition, perseverative mastication, and delayed swallow response (consistent with dementia-based dysphagia.) Thin  liquids elicited immediate cough response, indicative of likely aspiration.    Given respiratory status, reduced bolus recognition secondary to cognitive deficits, recommend continuing NPO for now.  Allow ice chips, can be given by staff or family.  SLP will follow up next date to determine status/improved readiness.  Family agrees with plan.    Aspiration Risk  Moderate    Diet Recommendation NPO;Ice chips PRN after oral care   Postural Changes and/or Swallow Maneuvers: Seated upright 90 degrees    Other  Recommendations Oral Care Recommendations: Oral care QID Other Recommendations: Have oral suction  available   Follow Up Recommendations  Other (comment) (tba)           SLP Swallow Goals Patient will utilize recommended strategies during swallow to increase swallowing safety with: Maximal cueing   Swallow Study Prior Functional Status       General Date of Onset: 03/12/13 HPI: 79 y.o. male Wyatt/ known dementia, Afib Wyatt/ CHADS2Vasc~5, CAD, MVR S/p annuloplasty 06/2002, colon cancer x 2, PPM, HTN, DM, COPD, BPH Wyatt/ chronic indwelling foley and elvated PSA, who was brought over from [name] 03/11/13 after he was found to have a temp of 102.9 associated Wyatt/ tachycardia and O2 sats 85% on 4 L.  Dx of sepsis, hypoxic resp failure.  Now on non-rebreather.  Palliative medicine met with family yesterday; they made decision made to continue current level of care, and to incorporate the use of medication on an as needed level to provide relieve  of pain or dyspnea.   Swallow eval ordered to determine safest PO diet.  Type of Study: Bedside swallow evaluation Diet Prior to this Study: NPO;Other (Comment) (Soft solids, thin liquids at SNF.  Pt able to feed self PTA) Temperature Spikes Noted: No Respiratory Status: Other (comment) (non-rebreather) History of Recent Intubation: No Behavior/Cognition: Alert;Doesn't follow directions Oral Cavity - Dentition: Poor condition Self-Feeding Abilities: Needs assist Patient Positioning: Upright in bed Baseline Vocal Quality:  (no voicing) Volitional Cough: Cognitively unable to elicit Volitional Swallow: Unable to elicit    Oral/Motor/Sensory Function Overall Oral Motor/Sensory Function:  (unable to assess - symmetry present)   Ice Chips Presentation: Spoon   Thin Liquid Thin Liquid: Impaired Oral Phase Impairments: Reduced labial seal;Reduced lingual movement/coordination;Impaired anterior to posterior transit Pharyngeal  Phase Impairments: Suspected delayed Swallow;Throat Clearing - Immediate;Cough - Immediate    Nectar Thick Nectar  Thick Liquid: Not tested   Honey Thick Honey Thick Liquid: Not tested   Puree Puree: Not tested   Solid   GO    Solid: Not tested       [name] [name] 03/13/2013,1:06 PM

## 2013-03-13 NOTE — Progress Notes (Signed)
Pt tx 4N per MD order, pt stable, family at [facility], all questions answered, report called to receiving RN, updates given at [facility], pt tol tx well, family at [facility]

## 2013-03-14 DIAGNOSIS — J9600 Acute respiratory failure, unspecified whether with hypoxia or hypercapnia: Secondary | ICD-10-CM | POA: Diagnosis present

## 2013-03-14 DIAGNOSIS — E872 Acidosis, unspecified: Secondary | ICD-10-CM | POA: Diagnosis present

## 2013-03-14 DIAGNOSIS — R6521 Severe sepsis with septic shock: Secondary | ICD-10-CM | POA: Diagnosis present

## 2013-03-14 DIAGNOSIS — A419 Sepsis, unspecified organism: Secondary | ICD-10-CM | POA: Diagnosis present

## 2013-03-14 DIAGNOSIS — N390 Urinary tract infection, site not specified: Secondary | ICD-10-CM | POA: Diagnosis present

## 2013-03-14 DIAGNOSIS — N179 Acute kidney failure, unspecified: Secondary | ICD-10-CM | POA: Diagnosis present

## 2013-03-14 LAB — CBC
HCT: 32.3 % — ABNORMAL LOW (ref 39.0–52.0)
Hemoglobin: 10.5 g/dL — ABNORMAL LOW (ref 13.0–17.0)
MCV: 92.8 fL (ref 78.0–100.0)
RBC: 3.48 MIL/uL — ABNORMAL LOW (ref 4.22–5.81)
WBC: 11.5 10*3/uL — ABNORMAL HIGH (ref 4.0–10.5)

## 2013-03-14 LAB — GLUCOSE, CAPILLARY
Glucose-Capillary: 97 mg/dL (ref 70–99)
Glucose-Capillary: 87 mg/dL (ref 70–99)
Glucose-Capillary: 97 mg/dL (ref 70–99)
Glucose-Capillary: 120 mg/dL — ABNORMAL HIGH (ref 70–99)

## 2013-03-14 LAB — BASIC METABOLIC PANEL
BUN: 66 mg/dL — ABNORMAL HIGH (ref 6–23)
CO2: 13 mEq/L — ABNORMAL LOW (ref 19–32)
Chloride: 118 mEq/L — ABNORMAL HIGH (ref 96–112)
Glucose, Bld: 94 mg/dL (ref 70–99)
Potassium: 4.8 mEq/L (ref 3.5–5.1)
Sodium: 145 mEq/L (ref 135–145)

## 2013-03-14 LAB — CULTURE, BLOOD (ROUTINE X 2)

## 2013-03-14 LAB — CREATININE, URINE, RANDOM: Creatinine, Urine: 65.43 mg/dL

## 2013-03-14 MED ORDER — BISACODYL 10 MG RE SUPP: 10.0000 mg | Freq: Every day | RECTAL | Status: DC | PRN

## 2013-03-14 MED ORDER — MORPHINE SULFATE (CONCENTRATE) 10 MG /0.5 ML PO SOLN
5.0000 mg | ORAL | Status: DC | PRN
  Administered 2013-03-17 – 2013-03-24 (×12): 5 mg via SUBLINGUAL
  Filled 2013-03-14 (×5): qty 0.5
  Filled 2013-03-14: qty 1
  Filled 2013-03-14 (×7): qty 0.5

## 2013-03-14 MED ORDER — SODIUM CHLORIDE 0.45 % IV SOLN: INTRAVENOUS | Status: DC

## 2013-03-14 NOTE — Progress Notes (Signed)
Patient WU:JWJXBJY DELL BRINER      DOB: 1915-07-02      NWG:[id number]  [name] was evaluated by SLP again, patient is moderate risk for aspiration, only taking small bites of food and sips of liquid. I discussed with daughters [name] and [name] the possibility of recurring bouts of pneumonia with dysphagia as well as poor future outcomes with minimal oral intake.   We talked about possible care planning for discharge with SNF with Palliative care services to follow as well as hospice support. Family does not want to proceed with artifical tube feedings in context of poor oral intake and dysphagia.Discussed decisions that need to be considered prior to discharge planning.  Plan to re-met with them this afternoon.   Freddie Breech, CNS-C Palliative Medicine Team [facility] Team Phone: [phone] Pager: [phone]

## 2013-03-14 NOTE — Progress Notes (Signed)
[facility] PROGRESS NOTE  [name] ZOX:[id number] DOB: [birth year] DOA: 03/11/2013 PCP: Lenoard Aden, NP  Assessment/Plan: Principal Problem:   Severe sepsis with septic shock Active Problems:   Unspecified hypothyroidism   Type II or unspecified type diabetes mellitus with neurological manifestations, not stated as uncontrolled(250.60)   COPD (chronic obstructive pulmonary disease)   Anemia of chronic disease   Palliative care encounter   Pain in right extremity   Dyspnea   ARF (acute renal failure)   UTI (urinary tract infection)   Acute respiratory failure   Metabolic acidosis   Brief narrative:  79 y.o. male w/ known Dementia, Afib w/ CHADS2Vasc~5, CAD, MVR S/p annuloplasty 06/2002, colon cancer x 2, PPM, HTN, DM, COPD, BPH w/ chronic indwelling foley and elvated PSA, who was brought over from [facility] 03/11/13 after he was found to have a temp of 102.9 associated w/ tachycardia and O2 sats 85% on 4 L.  At baseline the pt is total care and not oriented. The only thing he can do is feed himself.    Assessment/Plan:  Sepsis / septic shock  BP has improved w/ volume resuscitation - slow IVF as pt is developing crackles/congestion  Acute hypoxic resp failure  Due to septic shock - continues to require high flow O2 support - slow IVF - adjust O2 support as necessary  Gram negative rod Pyelonephritis/UTI  Grossly positive UA in pt w/ indwelling foley cath - Urine culture shows retention sensitive to Rocephin, D/C Zosyn yesterday. - d/c vancomycin (MRSA negative + gram neg rods on cx)  IV Rocephin started yesterday. Acute renal failure  Due to hypoperfusion +/- ATN - renal function has worsened since admit - follow trend - must slow IVF due to pulm congestion - foley was repositioned after results of CT suggesting it was not in bladder   Metabolic Acidosis - lactic acidosis  Due to acute renal failure + sepsis - lactate has normalized. Add  bicarb to IVF and follow. Chronic Afib  Rate controlled at the present time  DM  Follow CBG w/ SSI  CAD  MVR s/p annuloplasty  Dementia  Hx of HTN  Not an active problem at this time  COPD  Well compensated at present  BPH w/ chronic foley cath  CT suggested foley was not in bladder - cath was repositioned by RN  Hypothyroidism  Synthroid via IV  Overweight  BMI is 26.23 kg/(m^2)    Code Status: NO CODE  Family Communication: spoke w/ multiple family members at the bedside at length  Disposition Plan: Transferred to MedSurg, status post palliative care consultation, DNR/DNI, continue current antibiotic regimen Family understands the risk of aspiration and wants to start a diet after slp   Consultants:  Palliative Care  Procedures:  none  Antibiotics:  7/15 Vancomycin >> 7/16  7/15 Zosyn >>> 7/17 7/15 Cefipime >>> 7/15/ 7/17 Rocephin  DVT prophylaxis:  SQ heparin  HPI/Subjective:  The patient alert. Confused.  Objective: Filed Vitals:   03/14/13 0549 03/14/13 0822 03/14/13 0952 03/14/13 1339  BP: 134/75  140/91 163/98  Pulse: 77 77 78 74  Temp: 98.6 F (37 C)  98.7 F (37.1 C) 98.2 F (36.8 C)  TempSrc: Oral  Axillary Axillary  Resp: 20  20 20   Height:      Weight:      SpO2: 100% 97% 100% 100%    Intake/Output Summary (Last 24 hours) at 03/14/13 1649 Last data filed at 03/14/13 1342  Gross  per 24 hour  Intake 567.92 ml  Output    550 ml  Net  17.92 ml    Exam:  General: NAD. Alert. Pleasantly confused. Lungs: CTAB anterior lung fields. Cardiovascular: Regular rate and rhythm without murmur gallop or rub  Abdomen: Nontender, nondistended, soft, bowel sounds positive, no rebound, no ascites, no appreciable mass  Extremities: No significant cyanosis, clubbing, or edema bilateral lower extremities    Data Reviewed: Basic Metabolic Panel:  Recent Labs Lab 03/11/13 1045 03/11/13 1631 03/12/13 0135 03/13/13 0720 03/14/13 0841  NA 147*  --   150* 147* 145  K 4.5  --  4.1 4.7 4.8  CL 114*  --  121* 119* 118*  CO2 14*  --  15* 14* 13*  GLUCOSE 189*  --  117* 96 94  BUN 42*  --  48* 61* 66*  CREATININE 2.15* 2.22* 2.58* 2.78* 2.57*  CALCIUM 9.2  --  7.7* 8.1* 8.5    Liver Function Tests:  Recent Labs Lab 03/11/13 1045 03/12/13 0135  AST 15 22  ALT 8 8  ALKPHOS 89 60  BILITOT 0.6 0.4  PROT 6.8 5.3*  ALBUMIN 3.0* 2.2*   No results found for this basename: LIPASE, AMYLASE,  in the last 168 hours No results found for this basename: AMMONIA,  in the last 168 hours  CBC:  Recent Labs Lab 03/11/13 1045 03/11/13 1631 03/12/13 0135 03/13/13 0720 03/14/13 0841  WBC 1.8* 18.1* 18.5* 10.6* 11.5*  NEUTROABS 1.6*  --   --   --   --   HGB 12.2* 10.6* 9.0* 9.6* 10.5*  HCT 37.8* 34.0* 27.6* 29.6* 32.3*  MCV 92.2 95.8 92.6 91.6 92.8  PLT 135* 127* 115* 89* 116*    Cardiac Enzymes: No results found for this basename: CKTOTAL, CKMB, CKMBINDEX, TROPONINI,  in the last 168 hours BNP (last 3 results) No results found for this basename: PROBNP,  in the last 8760 hours   CBG:  Recent Labs Lab 03/13/13 1603 03/13/13 2019 03/14/13 0110 03/14/13 0411 03/14/13 1232  GLUCAP 103* 104* 97 87 97    Recent Results (from the past 240 hour(s))  CULTURE, BLOOD (ROUTINE X 2)     Status: None   Collection Time    03/11/13 10:45 AM      Result Value Range Status   Specimen Description BLOOD RIGHT FOREARM   Final   Special Requests BOTTLES DRAWN AEROBIC AND ANAEROBIC 10CC   Final   Culture  Setup Time 03/11/2013 16:28   Final   Culture     Final   Value: PROTEUS MIRABILIS     Note: Gram Stain Report Called to,Read Back By and Verified With: MELISSA HANCOCK 03/12/13 AT 0240 RIDK   Report Status 03/14/2013 FINAL   Final   Organism ID, Bacteria PROTEUS MIRABILIS   Final  URINE CULTURE     Status: None   Collection Time    03/11/13 11:11 AM      Result Value Range Status   Specimen Description URINE, CATHETERIZED   Final    Special Requests NONE   Final   Culture  Setup Time 03/11/2013 12:06   Final   Colony Count >=100,000 COLONIES/ML   Final   Culture PROVIDENCIA STUARTII   Final   Report Status 03/13/2013 FINAL   Final   Organism ID, Bacteria PROVIDENCIA STUARTII   Final  CULTURE, BLOOD (ROUTINE X 2)     Status: None   Collection Time    03/11/13 11:55  AM      Result Value Range Status   Specimen Description BLOOD HAND RIGHT   Final   Special Requests BOTTLES DRAWN AEROBIC ONLY [facility]   Final   Culture  Setup Time 03/11/2013 16:28   Final   Culture     Final   Value:        BLOOD CULTURE RECEIVED NO GROWTH TO DATE CULTURE WILL BE HELD FOR 5 DAYS BEFORE ISSUING A FINAL NEGATIVE REPORT   Report Status PENDING   Incomplete  MRSA PCR SCREENING     Status: None   Collection Time    03/11/13  3:58 PM      Result Value Range Status   MRSA by PCR NEGATIVE  NEGATIVE Final   Comment:            The GeneXpert MRSA Assay (FDA     approved for NASAL specimens     only), is one component of a     comprehensive MRSA colonization     surveillance program. It is not     intended to diagnose MRSA     infection nor to guide or     monitor treatment for     MRSA infections.     Studies: Ct Abdomen Pelvis Wo Contrast  03/11/2013   *RADIOLOGY REPORT*  Clinical Data: Abdominal pain and fever.  CT ABDOMEN AND PELVIS WITHOUT CONTRAST  Technique:  Multidetector CT imaging of the abdomen and pelvis was performed following the standard protocol without intravenous contrast.  Comparison:  12/08/2009  Findings: Examination is limited by patient motion.  The lung bases demonstrate bibasilar atelectasis.  The heart is enlarged.  The liver is grossly normal.  The spleen is intact.  The pancreas and adrenal glands are grossly normal.  The left kidney demonstrates  simple cysts.  No hydronephrosis.  The stomach, duodenum, small bowel and colon are grossly normal without oral contrast.  There is a large amount of stool in the rectum  suggesting fecal impaction.  No mesenteric or retroperitoneal mass, adenopathy, hematoma or abscess.  The aorta demonstrates advanced atherosclerotic calcifications.  An IVC filter is in place.  There is a large amount of air in the bladder along with some layering high attenuation material which could be blood.  The Foley catheter balloon is in the prostate gland and not in the bladder. The Foley catheter needs to be repositioned.  The bony structures are unremarkable.  There is advanced osteoporosis.  No destructive bony changes.  IMPRESSION:  1.  The Foley catheter is not in the bladder.  The balloon is in the prostate gland.  This needs to be repositioned. 2.  The bladder is distended.  It contains air and probable hemorrhage. 3.  No other significant intra-abdominal/intrapelvic abnormalities but the study is quite limited without contrast and due to patient motion.   Original Report Authenticated By: Rudie Meyer, M.D.   Dg Chest Portable 1 View  03/11/2013   *RADIOLOGY REPORT*  Clinical Data: Fever.  PORTABLE CHEST - 1 VIEW  Comparison: Chest x-ray 06/14/2010.  Findings: Linear opacity at the left base favored to represent subsegmental atelectasis (underlying air space consolidation is difficult to entirely exclude).  No definite pleural effusions. Pulmonary venous congestion, without frank pulmonary edema.  Heart size appears mildly enlarged. The patient is rotated to the left on today's exam, resulting in distortion of the mediastinal contours and reduced diagnostic sensitivity and specificity for mediastinal pathology.  Atherosclerosis in the thoracic aorta.  Left-sided  pacemaker device in place with lead tip projecting over the expected location of the right ventricular apex.  Status post median sternotomy.  IMPRESSION: 1.  Low lung volumes with probable subsegmental atelectasis in the left lower lobe. 2.  Mild cardiomegaly with pulmonary venous congestion. 3.  Atherosclerosis. 4.  Postoperative changes  and support apparatus, as above.   Original Report Authenticated By: Trudie Reed, M.D.    Scheduled Meds: . cefTRIAXone (ROCEPHIN)  IV  1 g Intravenous Q24H  . heparin  5,000 Units Subcutaneous Q8H  . insulin aspart  0-9 Units Subcutaneous Q4H  . levothyroxine  60 mcg Intravenous QAC breakfast   Continuous Infusions: . sodium chloride 0.45 % 1,000 mL with sodium bicarbonate 150 mEq infusion      Principal Problem:   Severe sepsis with septic shock Active Problems:   Unspecified hypothyroidism   Type II or unspecified type diabetes mellitus with neurological manifestations, not stated as uncontrolled(250.60)   COPD (chronic obstructive pulmonary disease)   Anemia of chronic disease   Palliative care encounter   Pain in right extremity   Dyspnea   ARF (acute renal failure)   UTI (urinary tract infection)   Acute respiratory failure   Metabolic acidosis    Time spent: 50 minutes   [facility]  [facility] Pager [phone]. If 8PM-8AM, please contact night-coverage at [email or web address], password [facility] 03/14/2013, 4:49 PM  LOS: 3 days

## 2013-03-14 NOTE — Progress Notes (Signed)
Palliative Medicine Team SW Psychosocial follow up. Pt continues to be more alert today, taking some bites of his lunch at family's encouragement. Social visit with various relatives, key decision-makers not present. PMT NP and I unable to further discussions plan of care, PMT NP to follow up.   Tony Francois, LCSW PMT Phone [phone] Pager [phone]

## 2013-03-14 NOTE — Progress Notes (Signed)
Speech Language Pathology Dysphagia Treatment Patient Details Name: [name] MRN: [id number] DOB: 05-11-1915 Today's Date: 03/14/2013 Time: 0924-1000 SLP Time Calculation (min): 36 min  Assessment / Plan / Recommendation Clinical Impression  F/u to determine readiness to resume PO diet.  Pt now on nasal cannula (4l); O2 sats at 95% and greater; dropped below 90% during coughing episodes associated with PO intake. Pt more attentive today, verbalizing, and indicating when he desired more POs and when he did not.  Continues with persisting prolonged oral phase, delayed swallow initiation, and intermittent coughing response after all trials, regardless of consistency.  Pt likely with intermittent aspiration of POs.    Spoke at length with daughters, who have decided after discussion with Palliative Medicine that artificial feeding is not desired.  They would like to resume PO diet, asserting their understanding that there is an inherent risk of aspiration.  Rec starting a dysphagia 1 diet with thin liquids; pt will require careful hand-feeding.  Cease feeding if overt coughing, discomfort is apparent.       Diet Recommendation  Initiate / Change Diet: Dysphagia 1 (puree);Thin liquid    SLP Plan All goals met   Pertinent Vitals/Pain No apparent pain   Swallowing Goals  met    General Temperature Spikes Noted: No Respiratory Status: Supplemental O2 delivered via (comment) Behavior/Cognition: Alert;Doesn't follow directions Oral Cavity - Dentition: Poor condition Patient Positioning: Upright in bed  Oral Cavity - Oral Hygiene Patient is HIGH RISK - Oral Care Protocol followed (see row info): Yes   Dysphagia Treatment Treatment focused on: Skilled observation of diet tolerance;Upgraded PO texture trials;Patient/family/caregiver education;Facilitation of oral preparatory phase;Facilitation of pharyngeal phase Family/Caregiver Educated: daughters Treatment Methods/Modalities: Skilled  observation;Differential diagnosis Patient observed directly with PO's: Yes Type of PO's observed: Dysphagia 1 (puree);Thin liquids;Honey-thick liquids Feeding: Total assist Liquids provided via: Teaspoon Oral Phase Signs & Symptoms: Prolonged mastication;Prolonged bolus formation;Prolonged oral phase Pharyngeal Phase Signs & Symptoms: Suspected delayed swallow initiation;Immediate throat clear;Delayed cough;Immediate cough Type of cueing: Verbal;Tactile Amount of cueing: Total   [name] L. [name], [place] CCC/SLP Pager [phone]      [name] [name] 03/14/2013, 10:18 AM

## 2013-03-14 NOTE — Clinical Social Work Note (Signed)
Clinical Social Worker continuing to follow patient and family for support and discharge planning needs.  Patient family has met with palliative care services who plan to follow up with family again.  CSW spoke with facility who states that patient may return once medically stable.  Per MD, plan is to continue to evaluate patient throughout weekend and establish if Palliative services will follow at facility and hopeful for discharge on Monday.  CSW has contacted facility and notified them of plan at this time.  CSW remains available for support and to facilitate patient discharge needs once medically ready.  Macario Golds, [place] [phone]

## 2013-03-14 NOTE — Progress Notes (Signed)
Patient [name]      DOB: 1915-04-17      WUJ:[id number]   Palliative Medicine Team at [facility] Progress Note    Subjective: Patient with eyes open, verbalizations sporadic, consist of unintelligible words, does node on occasion to questions pertinent to comfort. Initiated on Dysphagia 1 diet today, overall oral food and fluid intake poor, minimal bites and sips. Patient did admit to R leg pain.  Daughter [name]) and patients sister, [name] at bedside. I emphasized the need for the family to discuss and consider options for disposition in the context of patients minimal oral in take and his overall quality of life. We talked about the natural history decline and transition to EOL with focus being on comfort and symptom management, not prolongation of the process with interventions such as IV hydration.  At the current time they want to continue current conventional medical interventions.  Filed Vitals:   03/14/13 1339  BP: 163/98  Pulse: 74  Temp: 98.2 F (36.8 C)  Resp: 20   Physical exam: General: Alert HEENT: poor dentition CHEST: shallow respiratory effort, diminished at bases CVS: RRR ABD: soft, BS hypoactive NEURO: alert, basically non-verbal  Assessment and plan: Overall potential for patient decline present due to ongoing risk for aspiration and poor oral intake. Discussed with family need to consider patients quality of life in conjunction with potential ongoing decline.    1) Code Status: DNR/DNI 2) Pain/Dyspnea: patient with known RLE pain-ordered Roxanol 5 mg SL every 4 hours as needed, encouraged staff to administer prior to repositioning, and also to assess for non-verbal indications of pain (eg; grimacing, agitation, restlessness) 3) Disposition: pending on families decisions-will meet with them on Monday   Time In Time Out Total Time Spent with Patient Total Overall Time  2:30p 3:30p 60 min 60 min   Freddie Breech, CNS-C Palliative  Medicine Team [facility] Team Phone: [phone] Pager: [phone]

## 2013-03-15 DIAGNOSIS — E1149 Type 2 diabetes mellitus with other diabetic neurological complication: Secondary | ICD-10-CM

## 2013-03-15 LAB — GLUCOSE, CAPILLARY
Glucose-Capillary: 106 mg/dL — ABNORMAL HIGH (ref 70–99)
Glucose-Capillary: 158 mg/dL — ABNORMAL HIGH (ref 70–99)

## 2013-03-15 LAB — BASIC METABOLIC PANEL
CO2: 14 mEq/L — ABNORMAL LOW (ref 19–32)
Chloride: 120 mEq/L — ABNORMAL HIGH (ref 96–112)
Creatinine, Ser: 2.2 mg/dL — ABNORMAL HIGH (ref 0.50–1.35)
GFR calc Af Amer: 27 mL/min — ABNORMAL LOW (ref >90)
Potassium: 4.3 mEq/L (ref 3.5–5.1)
Sodium: 149 mEq/L — ABNORMAL HIGH (ref 135–145)

## 2013-03-15 LAB — CBC
HCT: 34 % — ABNORMAL LOW (ref 39.0–52.0)
MCV: 91.6 fL (ref 78.0–100.0)
Platelets: 126 10*3/uL — ABNORMAL LOW (ref 150–400)
RBC: 3.71 MIL/uL — ABNORMAL LOW (ref 4.22–5.81)
RDW: 15.6 % — ABNORMAL HIGH (ref 11.5–15.5)
WBC: 9.7 10*3/uL (ref 4.0–10.5)

## 2013-03-15 MED ORDER — SODIUM BICARBONATE 8.4 % IV SOLN
INTRAVENOUS | Status: DC
  Administered 2013-03-15: 12:00:00 via INTRAVENOUS
  Filled 2013-03-15 (×3): qty 1000

## 2013-03-15 NOTE — Progress Notes (Signed)
[facility] PROGRESS NOTE  PAVAN BRING RUE:[id number] DOB: Jun 21, 1915 DOA: 03/11/2013 PCP: Lenoard Aden, NP  Assessment/Plan: Principal Problem:   Severe sepsis with septic shock Active Problems:   Unspecified hypothyroidism   Type II or unspecified type diabetes mellitus with neurological manifestations, not stated as uncontrolled(250.60)   COPD (chronic obstructive pulmonary disease)   Anemia of chronic disease   Palliative care encounter   Pain in right extremity   Dyspnea   ARF (acute renal failure)   UTI (urinary tract infection)   Acute respiratory failure   Metabolic acidosis   Brief narrative:  79 y.o. 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  At baseline the pt is total care and not oriented. The only thing he can do is feed himself.    Assessment/Plan:  Sepsis / septic shock  BP has improved w/ volume resuscitation - Decrease IVF. Acute hypoxic resp failure  Due to septic shock - continues to require high flow O2 support - slow IVF - adjust O2 support as necessary  Gram negative rod Pyelonephritis/UTI  Grossly positive UA in pt w/ indwelling foley cath - Urine culture shows retention sensitive to Rocephin, D/C Zosyn yesterday. - d/c vancomycin (MRSA negative + gram neg rods on cx)  IV Rocephin started. Acute renal failure  Slowly improving. Due to hypoperfusion +/- ATN - renal function has worsened since admit - follow trend - must slow IVF due to pulm congestion - foley was repositioned after results of CT suggesting it was not in bladder   Metabolic Acidosis - lactic acidosis  Due to acute renal failure + sepsis - lactate has normalized. Continue bicarb and  follow. Chronic Afib  Rate controlled at the present time  DM  Follow CBG w/ SSI  CAD  MVR s/p annuloplasty  Dementia  Hx of HTN  Not an active problem at this time  COPD  Well compensated at present  BPH w/ chronic foley cath  CT suggested foley was not in bladder - cath was repositioned by RN  Hypothyroidism  Synthroid via IV  Overweight  BMI is 26.23 kg/(m^2)    Code Status: NO CODE  Family Communication: spoke w/ multiple family members at the bedside at length  Disposition Plan: Transferred to MedSurg, status post palliative care consultation, DNR/DNI, continue current antibiotic regimen Family understands the risk of aspiration and wants to start a diet after slp   Consultants:  Palliative Care  Procedures:  none  Antibiotics:  7/15 Vancomycin >> 7/16  7/15 Zosyn >>> 7/17 7/15 Cefipime >>> 7/15/ 7/17 Rocephin  DVT prophylaxis:  SQ heparin  HPI/Subjective:  The patient more alert. Confused.  Objective: Filed Vitals:   03/14/13 1339 03/14/13 2230 03/15/13 0525 03/15/13 1027  BP: 163/98 152/103 105/76 132/88  Pulse: 74 79 65 71  Temp: 98.2 F (36.8 C) 98 F (36.7 C) 98.2 F (36.8 C) 97.4 F (36.3 C)  TempSrc: Axillary Axillary Axillary Axillary  Resp: 20 22 20 20   Height:      Weight:      SpO2: 100% 99% 99% 100%    Intake/Output Summary (Last 24 hours) at 03/15/13 1521 Last data filed at 03/14/13 2230  Gross per 24 hour  Intake      0 ml  Output    700 ml  Net   -700 ml    Exam:  General: NAD. Alert. Pleasantly confused. Lungs: Some bibasilar crackles. Cardiovascular: Regular rate and rhythm without murmur gallop or rub  Abdomen: Nontender, nondistended, soft, bowel sounds positive, no rebound, no ascites, no appreciable mass  Extremities: No significant cyanosis, clubbing, 1+ bilateral lower extremity edema.     Data Reviewed: Basic Metabolic Panel:  Recent Labs Lab 03/11/13 1045 03/11/13 1631 03/12/13 0135 03/13/13 0720  03/14/13 0841 03/15/13 0500  NA 147*  --  150* 147* 145 149*  K 4.5  --  4.1 4.7 4.8 4.3  CL 114*  --  121* 119* 118* 120*  CO2 14*  --  15* 14* 13* 14*  GLUCOSE 189*  --  117* 96 94 105*  BUN 42*  --  48* 61* 66* 62*  CREATININE 2.15* 2.22* 2.58* 2.78* 2.57* 2.20*  CALCIUM 9.2  --  7.7* 8.1* 8.5 8.5    Liver Function Tests:  Recent Labs Lab 03/11/13 1045 03/12/13 0135  AST 15 22  ALT 8 8  ALKPHOS 89 60  BILITOT 0.6 0.4  PROT 6.8 5.3*  ALBUMIN 3.0* 2.2*   No results found for this basename: LIPASE, AMYLASE,  in the last 168 hours No results found for this basename: AMMONIA,  in the last 168 hours  CBC:  Recent Labs Lab 03/11/13 1045 03/11/13 1631 03/12/13 0135 03/13/13 0720 03/14/13 0841 03/15/13 0500  WBC 1.8* 18.1* 18.5* 10.6* 11.5* 9.7  NEUTROABS 1.6*  --   --   --   --   --   HGB 12.2* 10.6* 9.0* 9.6* 10.5* 10.9*  HCT 37.8* 34.0* 27.6* 29.6* 32.3* 34.0*  MCV 92.2 95.8 92.6 91.6 92.8 91.6  PLT 135* 127* 115* 89* 116* 126*    Cardiac Enzymes: No results found for this basename: CKTOTAL, CKMB, CKMBINDEX, TROPONINI,  in the last 168 hours BNP (last 3 results) No results found for this basename: PROBNP,  in the last 8760 hours   CBG:  Recent Labs Lab 03/14/13 1656 03/14/13 2014 03/14/13 2358 03/15/13 0407 03/15/13 1200  GLUCAP 104* 120* 120* 106* 116*    Recent Results (from the past 240 hour(s))  CULTURE, BLOOD (ROUTINE X 2)     Status: None   Collection Time    03/11/13 10:45 AM      Result Value Range Status   Specimen Description BLOOD RIGHT FOREARM   Final   Special Requests BOTTLES DRAWN AEROBIC AND ANAEROBIC 10CC   Final   Culture  Setup Time 03/11/2013 16:28   Final   Culture     Final   Value: PROTEUS MIRABILIS     Note: Gram Stain Report Called to,Read Back By and Verified With: MELISSA HANCOCK 03/12/13 AT 0240 RIDK   Report Status 03/14/2013 FINAL   Final   Organism ID, Bacteria PROTEUS MIRABILIS   Final  URINE CULTURE     Status:  None   Collection Time    03/11/13 11:11 AM      Result Value Range Status   Specimen Description URINE, CATHETERIZED   Final   Special Requests NONE   Final   Culture  Setup Time 03/11/2013 12:06   Final   Colony Count >=100,000 COLONIES/ML   Final   Culture PROVIDENCIA STUARTII   Final   Report Status 03/13/2013 FINAL   Final   Organism ID, Bacteria PROVIDENCIA STUARTII  Final  CULTURE, BLOOD (ROUTINE X 2)     Status: None   Collection Time    03/11/13 11:55 AM      Result Value Range Status   Specimen Description BLOOD HAND RIGHT   Final   Special Requests BOTTLES DRAWN AEROBIC ONLY [facility]   Final   Culture  Setup Time 03/11/2013 16:28   Final   Culture     Final   Value:        BLOOD CULTURE RECEIVED NO GROWTH TO DATE CULTURE WILL BE HELD FOR 5 DAYS BEFORE ISSUING A FINAL NEGATIVE REPORT   Report Status PENDING   Incomplete  MRSA PCR SCREENING     Status: None   Collection Time    03/11/13  3:58 PM      Result Value Range Status   MRSA by PCR NEGATIVE  NEGATIVE Final   Comment:            The GeneXpert MRSA Assay (FDA     approved for NASAL specimens     only), is one component of a     comprehensive MRSA colonization     surveillance program. It is not     intended to diagnose MRSA     infection nor to guide or     monitor treatment for     MRSA infections.     Studies: Ct Abdomen Pelvis Wo Contrast  03/11/2013   *RADIOLOGY REPORT*  Clinical Data: Abdominal pain and fever.  CT ABDOMEN AND PELVIS WITHOUT CONTRAST  Technique:  Multidetector CT imaging of the abdomen and pelvis was performed following the standard protocol without intravenous contrast.  Comparison:  12/08/2009  Findings: Examination is limited by patient motion.  The lung bases demonstrate bibasilar atelectasis.  The heart is enlarged.  The liver is grossly normal.  The spleen is intact.  The pancreas and adrenal glands are grossly normal.  The left kidney demonstrates  simple cysts.  No hydronephrosis.  The  stomach, duodenum, small bowel and colon are grossly normal without oral contrast.  There is a large amount of stool in the rectum suggesting fecal impaction.  No mesenteric or retroperitoneal mass, adenopathy, hematoma or abscess.  The aorta demonstrates advanced atherosclerotic calcifications.  An IVC filter is in place.  There is a large amount of air in the bladder along with some layering high attenuation material which could be blood.  The Foley catheter balloon is in the prostate gland and not in the bladder. The Foley catheter needs to be repositioned.  The bony structures are unremarkable.  There is advanced osteoporosis.  No destructive bony changes.  IMPRESSION:  1.  The Foley catheter is not in the bladder.  The balloon is in the prostate gland.  This needs to be repositioned. 2.  The bladder is distended.  It contains air and probable hemorrhage. 3.  No other significant intra-abdominal/intrapelvic abnormalities but the study is quite limited without contrast and due to patient motion.   Original Report Authenticated By: Rudie Meyer, M.D.   Dg Chest Portable 1 View  03/11/2013   *RADIOLOGY REPORT*  Clinical Data: Fever.  PORTABLE CHEST - 1 VIEW  Comparison: Chest x-ray 06/14/2010.  Findings: Linear opacity at the left base favored to represent subsegmental atelectasis (underlying air space consolidation is difficult to entirely exclude).  No definite pleural effusions. Pulmonary venous congestion, without frank pulmonary edema.  Heart size appears mildly enlarged. The patient is rotated to the left on today's exam, resulting in  distortion of the mediastinal contours and reduced diagnostic sensitivity and specificity for mediastinal pathology.  Atherosclerosis in the thoracic aorta.  Left-sided pacemaker device in place with lead tip projecting over the expected location of the right ventricular apex.  Status post median sternotomy.  IMPRESSION: 1.  Low lung volumes with probable subsegmental  atelectasis in the left lower lobe. 2.  Mild cardiomegaly with pulmonary venous congestion. 3.  Atherosclerosis. 4.  Postoperative changes and support apparatus, as above.   Original Report Authenticated By: Trudie Reed, M.D.    Scheduled Meds: . cefTRIAXone (ROCEPHIN)  IV  1 g Intravenous Q24H  . heparin  5,000 Units Subcutaneous Q8H  . insulin aspart  0-9 Units Subcutaneous Q4H  . levothyroxine  60 mcg Intravenous QAC breakfast   Continuous Infusions: . dextrose 5 % 1,000 mL with sodium bicarbonate 150 mEq infusion 75 mL/hr at 03/15/13 1220    Principal Problem:   Severe sepsis with septic shock Active Problems:   Unspecified hypothyroidism   Type II or unspecified type diabetes mellitus with neurological manifestations, not stated as uncontrolled(250.60)   COPD (chronic obstructive pulmonary disease)   Anemia of chronic disease   Palliative care encounter   Pain in right extremity   Dyspnea   ARF (acute renal failure)   UTI (urinary tract infection)   Acute respiratory failure   Metabolic acidosis    Time spent: 50 minutes   [facility]  [facility] Pager [phone]. If 8PM-8AM, please contact night-coverage at [email or web address], password [facility] 03/15/2013, 3:21 PM  LOS: 4 days

## 2013-03-16 DIAGNOSIS — A419 Sepsis, unspecified organism: Principal | ICD-10-CM

## 2013-03-16 DIAGNOSIS — R6521 Severe sepsis with septic shock: Secondary | ICD-10-CM

## 2013-03-16 DIAGNOSIS — M7989 Other specified soft tissue disorders: Secondary | ICD-10-CM

## 2013-03-16 DIAGNOSIS — R6520 Severe sepsis without septic shock: Secondary | ICD-10-CM

## 2013-03-16 LAB — BASIC METABOLIC PANEL
CO2: 14 mEq/L — ABNORMAL LOW (ref 19–32)
Calcium: 8.3 mg/dL — ABNORMAL LOW (ref 8.4–10.5)
Creatinine, Ser: 1.65 mg/dL — ABNORMAL HIGH (ref 0.50–1.35)
GFR calc non Af Amer: 33 mL/min — ABNORMAL LOW (ref >90)
Glucose, Bld: 104 mg/dL — ABNORMAL HIGH (ref 70–99)
Sodium: 146 mEq/L — ABNORMAL HIGH (ref 135–145)

## 2013-03-16 LAB — GLUCOSE, CAPILLARY
Glucose-Capillary: 101 mg/dL — ABNORMAL HIGH (ref 70–99)
Glucose-Capillary: 114 mg/dL — ABNORMAL HIGH (ref 70–99)

## 2013-03-16 NOTE — Progress Notes (Signed)
Left arm swollen, arm elevated. Dr. Janee Morn made aware.

## 2013-03-16 NOTE — Progress Notes (Signed)
[facility] PROGRESS NOTE  [name] ZOX:[id number] DOB: [birth year] DOA: 03/11/2013 PCP: Lenoard Aden, NP  Assessment/Plan: Principal Problem:   Severe sepsis with septic shock Active Problems:   Unspecified hypothyroidism   Type II or unspecified type diabetes mellitus with neurological manifestations, not stated as uncontrolled(250.60)   COPD (chronic obstructive pulmonary disease)   Anemia of chronic disease   Palliative care encounter   Pain in right extremity   Dyspnea   ARF (acute renal failure)   UTI (urinary tract infection)   Acute respiratory failure   Metabolic acidosis   Swelling of limb   Brief narrative:  79 y.o. 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  At baseline the pt is total care and not oriented. The only thing he can do is feed himself.    Assessment/Plan:  Sepsis / septic shock  BP has improved w/ volume resuscitation - Decrease IVF. Acute hypoxic resp failure  Due to septic shock - continues to require high flow O2 support - slow IVF - adjust O2 support as necessary  Gram negative rod Pyelonephritis/UTI  Grossly positive UA in pt w/ indwelling foley cath - Urine culture shows retention sensitive to Rocephin, D/C Zosyn yesterday. - d/c vancomycin (MRSA negative + gram neg rods on cx)  IV Rocephin started. Acute renal failure  Slowly improving. Due to hypoperfusion +/- ATN - renal function has worsened since admit - follow trend - must slow IVF due to pulm congestion - foley was repositioned after results of CT suggesting it was not in bladder   Metabolic Acidosis - lactic acidosis  Due to acute renal failure + sepsis - lactate has normalized. Continue  bicarb and follow. Chronic Afib  Rate controlled at the present time  DM  Follow CBG w/ SSI  CAD  MVR s/p annuloplasty  Dementia  Hx of HTN  Not an active problem at this time  COPD  Well compensated at present  BPH w/ chronic foley cath  CT suggested foley was not in bladder - cath was repositioned by RN  Hypothyroidism  Synthroid via IV  Overweight  BMI is 26.23 kg/(m^2)  LUE swelling Check dopplers to r/o DVT.   Code Status: NO CODE  Family Communication: spoke w/ multiple family members at the bedside at length  Disposition Plan: Transferred to MedSurg, status post palliative care consultation, DNR/DNI, continue current antibiotic regimen Family understands the risk of aspiration and wants to start a diet after slp   Consultants:  Palliative Care  Procedures:  none  Antibiotics:  7/15 Vancomycin >> 7/16  7/15 Zosyn >>> 7/17 7/15 Cefipime >>> 7/15/ 7/17 Rocephin  DVT prophylaxis:  SCD  HPI/Subjective:  The patient more alert. Confused.  Objective: Filed Vitals:   03/15/13 0525 03/15/13 1027 03/15/13 2136 03/16/13 0700  BP: 105/76 132/88 141/87 164/83  Pulse: 65 71 79 75  Temp: 98.2 F (36.8 C) 97.4 F (36.3 C) 97.8 F (36.6 C)   TempSrc: Axillary Axillary Axillary   Resp: 20 20 22 24   Height:      Weight:      SpO2: 99% 100% 99% 99%    Intake/Output Summary (Last 24 hours) at 03/16/13 1802 Last data filed  at 03/16/13 1300  Gross per 24 hour  Intake 566.25 ml  Output   2250 ml  Net -1683.75 ml    Exam:  General: NAD. Alert. Pleasantly confused. Lungs: Some bibasilar crackles. Cardiovascular: Regular rate and rhythm without murmur gallop or rub  Abdomen: Nontender, nondistended, soft, bowel sounds positive, no rebound, no ascites, no appreciable mass  Extremities: No significant cyanosis, clubbing, 1+ bilateral lower extremity edema. LUE swelling.    Data Reviewed: Basic Metabolic Panel:  Recent Labs Lab 03/12/13 0135 03/13/13 0720  03/14/13 0841 03/15/13 0500 03/16/13 0418  NA 150* 147* 145 149* 146*  K 4.1 4.7 4.8 4.3 5.1  CL 121* 119* 118* 120* 119*  CO2 15* 14* 13* 14* 14*  GLUCOSE 117* 96 94 105* 104*  BUN 48* 61* 66* 62* 48*  CREATININE 2.58* 2.78* 2.57* 2.20* 1.65*  CALCIUM 7.7* 8.1* 8.5 8.5 8.3*    Liver Function Tests:  Recent Labs Lab 03/11/13 1045 03/12/13 0135  AST 15 22  ALT 8 8  ALKPHOS 89 60  BILITOT 0.6 0.4  PROT 6.8 5.3*  ALBUMIN 3.0* 2.2*   No results found for this basename: LIPASE, AMYLASE,  in the last 168 hours No results found for this basename: AMMONIA,  in the last 168 hours  CBC:  Recent Labs Lab 03/11/13 1045 03/11/13 1631 03/12/13 0135 03/13/13 0720 03/14/13 0841 03/15/13 0500  WBC 1.8* 18.1* 18.5* 10.6* 11.5* 9.7  NEUTROABS 1.6*  --   --   --   --   --   HGB 12.2* 10.6* 9.0* 9.6* 10.5* 10.9*  HCT 37.8* 34.0* 27.6* 29.6* 32.3* 34.0*  MCV 92.2 95.8 92.6 91.6 92.8 91.6  PLT 135* 127* 115* 89* 116* 126*    Cardiac Enzymes: No results found for this basename: CKTOTAL, CKMB, CKMBINDEX, TROPONINI,  in the last 168 hours BNP (last 3 results) No results found for this basename: PROBNP,  in the last 8760 hours   CBG:  Recent Labs Lab 03/15/13 1626 03/15/13 2021 03/16/13 0005 03/16/13 0411 03/16/13 1152  GLUCAP 158* 184* 123* 101* 114*    Recent Results (from the past 240 hour(s))  CULTURE, BLOOD (ROUTINE X 2)     Status: None   Collection Time    03/11/13 10:45 AM      Result Value Range Status   Specimen Description BLOOD RIGHT FOREARM   Final   Special Requests BOTTLES DRAWN AEROBIC AND ANAEROBIC 10CC   Final   Culture  Setup Time 03/11/2013 16:28   Final   Culture     Final   Value: PROTEUS MIRABILIS     Note: Gram Stain Report Called to,Read Back By and Verified With: MELISSA HANCOCK 03/12/13 AT 0240 RIDK   Report Status 03/14/2013 FINAL   Final   Organism ID, Bacteria PROTEUS MIRABILIS   Final  URINE CULTURE     Status: None   Collection Time     03/11/13 11:11 AM      Result Value Range Status   Specimen Description URINE, CATHETERIZED   Final   Special Requests NONE   Final   Culture  Setup Time 03/11/2013 12:06   Final   Colony Count >=100,000 COLONIES/ML   Final   Culture PROVIDENCIA STUARTII   Final   Report Status 03/13/2013 FINAL   Final   Organism ID, Bacteria PROVIDENCIA STUARTII   Final  CULTURE, BLOOD (ROUTINE X 2)     Status: None   Collection Time    03/11/13 11:55  AM      Result Value Range Status   Specimen Description BLOOD HAND RIGHT   Final   Special Requests BOTTLES DRAWN AEROBIC ONLY [facility]   Final   Culture  Setup Time 03/11/2013 16:28   Final   Culture     Final   Value:        BLOOD CULTURE RECEIVED NO GROWTH TO DATE CULTURE WILL BE HELD FOR 5 DAYS BEFORE ISSUING A FINAL NEGATIVE REPORT   Report Status PENDING   Incomplete  MRSA PCR SCREENING     Status: None   Collection Time    03/11/13  3:58 PM      Result Value Range Status   MRSA by PCR NEGATIVE  NEGATIVE Final   Comment:            The GeneXpert MRSA Assay (FDA     approved for NASAL specimens     only), is one component of a     comprehensive MRSA colonization     surveillance program. It is not     intended to diagnose MRSA     infection nor to guide or     monitor treatment for     MRSA infections.     Studies: Ct Abdomen Pelvis Wo Contrast  03/11/2013   *RADIOLOGY REPORT*  Clinical Data: Abdominal pain and fever.  CT ABDOMEN AND PELVIS WITHOUT CONTRAST  Technique:  Multidetector CT imaging of the abdomen and pelvis was performed following the standard protocol without intravenous contrast.  Comparison:  12/08/2009  Findings: Examination is limited by patient motion.  The lung bases demonstrate bibasilar atelectasis.  The heart is enlarged.  The liver is grossly normal.  The spleen is intact.  The pancreas and adrenal glands are grossly normal.  The left kidney demonstrates  simple cysts.  No hydronephrosis.  The stomach, duodenum, small  bowel and colon are grossly normal without oral contrast.  There is a large amount of stool in the rectum suggesting fecal impaction.  No mesenteric or retroperitoneal mass, adenopathy, hematoma or abscess.  The aorta demonstrates advanced atherosclerotic calcifications.  An IVC filter is in place.  There is a large amount of air in the bladder along with some layering high attenuation material which could be blood.  The Foley catheter balloon is in the prostate gland and not in the bladder. The Foley catheter needs to be repositioned.  The bony structures are unremarkable.  There is advanced osteoporosis.  No destructive bony changes.  IMPRESSION:  1.  The Foley catheter is not in the bladder.  The balloon is in the prostate gland.  This needs to be repositioned. 2.  The bladder is distended.  It contains air and probable hemorrhage. 3.  No other significant intra-abdominal/intrapelvic abnormalities but the study is quite limited without contrast and due to patient motion.   Original Report Authenticated By: Rudie Meyer, M.D.   Dg Chest Portable 1 View  03/11/2013   *RADIOLOGY REPORT*  Clinical Data: Fever.  PORTABLE CHEST - 1 VIEW  Comparison: Chest x-ray 06/14/2010.  Findings: Linear opacity at the left base favored to represent subsegmental atelectasis (underlying air space consolidation is difficult to entirely exclude).  No definite pleural effusions. Pulmonary venous congestion, without frank pulmonary edema.  Heart size appears mildly enlarged. The patient is rotated to the left on today's exam, resulting in distortion of the mediastinal contours and reduced diagnostic sensitivity and specificity for mediastinal pathology.  Atherosclerosis in the thoracic aorta.  Left-sided  pacemaker device in place with lead tip projecting over the expected location of the right ventricular apex.  Status post median sternotomy.  IMPRESSION: 1.  Low lung volumes with probable subsegmental atelectasis in the left lower  lobe. 2.  Mild cardiomegaly with pulmonary venous congestion. 3.  Atherosclerosis. 4.  Postoperative changes and support apparatus, as above.   Original Report Authenticated By: Trudie Reed, M.D.    Scheduled Meds: . cefTRIAXone (ROCEPHIN)  IV  1 g Intravenous Q24H  . insulin aspart  0-9 Units Subcutaneous Q4H  . levothyroxine  60 mcg Intravenous QAC breakfast   Continuous Infusions: . dextrose 5 % 1,000 mL with sodium bicarbonate 150 mEq infusion 10 mL/hr at 03/15/13 1817    Principal Problem:   Severe sepsis with septic shock Active Problems:   Unspecified hypothyroidism   Type II or unspecified type diabetes mellitus with neurological manifestations, not stated as uncontrolled(250.60)   COPD (chronic obstructive pulmonary disease)   Anemia of chronic disease   Palliative care encounter   Pain in right extremity   Dyspnea   ARF (acute renal failure)   UTI (urinary tract infection)   Acute respiratory failure   Metabolic acidosis   Swelling of limb    Time spent: 50 minutes   [facility]  [facility] Pager [phone]. If 8PM-8AM, please contact night-coverage at [email or web address], password [facility] 03/16/2013, 6:02 PM  LOS: 5 days

## 2013-03-17 DIAGNOSIS — Z515 Encounter for palliative care: Secondary | ICD-10-CM

## 2013-03-17 DIAGNOSIS — E870 Hyperosmolality and hypernatremia: Secondary | ICD-10-CM | POA: Diagnosis present

## 2013-03-17 DIAGNOSIS — E1149 Type 2 diabetes mellitus with other diabetic neurological complication: Secondary | ICD-10-CM

## 2013-03-17 DIAGNOSIS — R6521 Severe sepsis with septic shock: Secondary | ICD-10-CM

## 2013-03-17 DIAGNOSIS — E872 Acidosis: Secondary | ICD-10-CM

## 2013-03-17 DIAGNOSIS — R7881 Bacteremia: Secondary | ICD-10-CM | POA: Diagnosis present

## 2013-03-17 DIAGNOSIS — E860 Dehydration: Secondary | ICD-10-CM

## 2013-03-17 DIAGNOSIS — A419 Sepsis, unspecified organism: Secondary | ICD-10-CM

## 2013-03-17 LAB — CULTURE, BLOOD (ROUTINE X 2): Culture: NO GROWTH

## 2013-03-17 LAB — BASIC METABOLIC PANEL
BUN: 36 mg/dL — ABNORMAL HIGH (ref 6–23)
Calcium: 8.4 mg/dL (ref 8.4–10.5)
GFR calc non Af Amer: 39 mL/min — ABNORMAL LOW (ref >90)
Glucose, Bld: 152 mg/dL — ABNORMAL HIGH (ref 70–99)

## 2013-03-17 MED ORDER — CHLORHEXIDINE GLUCONATE 0.12 % MT SOLN
15.0000 mL | Freq: Two times a day (BID) | OROMUCOSAL | Status: DC
  Administered 2013-03-17 – 2013-03-24 (×13): 15 mL via OROMUCOSAL
  Filled 2013-03-17 (×17): qty 15

## 2013-03-17 MED ORDER — BIOTENE DRY MOUTH MT LIQD
15.0000 mL | Freq: Two times a day (BID) | OROMUCOSAL | Status: DC
  Administered 2013-03-17 – 2013-03-23 (×7): 15 mL via OROMUCOSAL

## 2013-03-17 MED ORDER — FREE WATER: 250.0000 mL | Freq: Three times a day (TID) | Status: DC

## 2013-03-17 MED ORDER — DEXTROSE 5 % IV SOLN
INTRAVENOUS | Status: DC
  Administered 2013-03-17 – 2013-03-20 (×4): via INTRAVENOUS
  Filled 2013-03-17: qty 1000

## 2013-03-17 MED ORDER — FREE WATER
200.0000 mL | Freq: Three times a day (TID) | Status: DC
  Administered 2013-03-17 – 2013-03-18 (×2): 200 mL via ORAL

## 2013-03-17 NOTE — Progress Notes (Signed)
Palliative Medicine Team SW Psychosocial follow up. Pt sleeping comfortably, family meeting with PMT NP. Together we discussed family's concerns regarding pt's d/c plan, their concern that he may not get the same level of encouragement to eat as when family is present. Family expressed their lack of comfort making decisions for pt's care and reported their expectation that medical team should tell them "what should be done". Discussion had about the risk that pt faces of continuing a cycle of repeat hospitalizations with current interventions and team's efforts to give family an alternative to this path. Family discussed feeling as though they would be "acting as God" by choosing a comfort-only approach to care and also shared their perception that hospice "only gives medicine for pain". Attempted to clarify through general hospice education.  Empathized with family's frustration with perceived lack of "good options" and attempted to reassure them that they are not "deciding for him to die", but that his body is nearing that place on its own. Educated family on how to advocate for PCS services to follow and assist with transitioning to hospice services either at the SNF or residential hospice options as appropriate and when family ready. Family request time to continue considering these various options until pt deemed medically ready for d/c. Will continue to be available for support as needed.   Kennieth Francois, LCSW PMT Phone [phone] Pager [phone]

## 2013-03-17 NOTE — Clinical Social Work Note (Signed)
Clinical Social Worker continuing to follow for support and discharge planning needs.  CSW spoke with patient HCPOA Thelma Comp) over the phone to confirm patient plans at discharge.  Patient HCPOA states that he does want patient to return to [facility], but is concerned that he will be discharged too soon.  CSW explained that he is not medically stable for discharge today and will relay his concerns to MD tomorrow morning in rounds.  CSW also spoke with facility to provide update on patient current status.  [facility] remains agreeable to patient return once medically ready.  CSW remains available for support and to facilitate patient discharge needs once medically stable.  Macario Golds, [place] [phone]

## 2013-03-17 NOTE — Progress Notes (Signed)
[facility] PROGRESS NOTE  [name] NFA:[id number] DOB: November 06, 1914 DOA: 03/11/2013 PCP: Lenoard Aden, NP  Assessment/Plan: Principal Problem:   Severe sepsis with septic shock Active Problems:   Unspecified hypothyroidism   Type II or unspecified type diabetes mellitus with neurological manifestations, not stated as uncontrolled(250.60)   COPD (chronic obstructive pulmonary disease)   Anemia of chronic disease   Palliative care encounter   Pain in right extremity   Dyspnea   ARF (acute renal failure)   UTI (urinary tract infection)   Acute respiratory failure   Metabolic acidosis   Swelling of limb   Bacteremia   Hypernatremia   Brief narrative:  79 y.o. 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  At baseline the pt is total care and not oriented. The only thing he can do is feed himself.    Assessment/Plan:  Sepsis / septic shock  Secondary to UTI and bacteremia. Currently afebrile. BP has improved w/ volume resuscitation - continue IV Rocephin while in house. On discharge will need to change to oral Keflex to complete a two-week course of antibiotic therapy. Discussed with Dr. Orvan Falconer of infectious diseases. Acute hypoxic resp failure  Due to septic shock - continues to require high flow O2 support - slow IVF - adjust O2 support as necessary  Gram negative rod Pyelonephritis/UTI/ Proteus bacteremia Grossly positive UA in pt w/ indwelling foley cath - Urine culture shows  Providencia stuartii sensitive to Rocephin, D/C Zosyn. Blood cultures positive for proteus mirabilis sensitive to rocephin - d/c'd vancomycin (MRSA negative + gram neg rods on cx)  Continue IV Rocephin while in  house. On discharge will change to oral keflex to complete 2 week course. Acute renal failure  Slowly improving. Due to hypoperfusion +/- ATN - renal function has worsened since admit - follow trend - foley was repositioned after results of CT suggesting it was not in bladder  Hypernatremia Likely secondary to volume depletion in the setting of hypotension. Sodium levels were improving however her bowels back up. Likely secondary to the edition of sodium bicarbonate to IV fluids. Discontinue sodium bicarbonate. Continue D5W. Will place on free water flushes as well.  Metabolic Acidosis - lactic acidosis  Due to acute renal failure + sepsis - lactate has normalized. Improved with bicarb. D/C bicarb.  Chronic Afib  Rate controlled at the present time  DM  Follow CBG w/ SSI  CAD  MVR s/p annuloplasty  Dementia  Hx of HTN  Not an active problem at this time  COPD  Well compensated at present  BPH w/ chronic foley cath  CT suggested foley was not in bladder - cath was repositioned by RN  Hypothyroidism  Synthroid via IV  Overweight  BMI is 26.23 kg/(m^2)  LUE swelling Check dopplers to r/o DVT.   Code Status: NO CODE  Family Communication: spoke w/ multiple family members at the bedside at length  Disposition Plan: Transferred to MedSurg, status post palliative care consultation, DNR/DNI, continue current antibiotic regimen Family understands the risk of aspiration and wants to continue current diet.   Consultants:  Palliative Care  Procedures:  none  Antibiotics:  7/15 Vancomycin >> 7/16  7/15 Zosyn >>> 7/17 7/15  Cefipime >>> 7/15/ 7/17 Rocephin  DVT prophylaxis:  SCD  HPI/Subjective:  The patient more alert. Sleeping.  Objective: Filed Vitals:   03/16/13 2147 03/17/13 0623 03/17/13 1044 03/17/13 1347  BP: 150/38 149/51 155/80 164/69  Pulse: 77 65 74 69  Temp: 98.5 F (36.9 C) 97.9 F (36.6 C) 97.3 F (36.3 C) 98.6 F (37 C)  TempSrc: Axillary Axillary Axillary  Axillary  Resp: 18 18 24 25   Height:      Weight:      SpO2: 100% 100% 100% 100%    Intake/Output Summary (Last 24 hours) at 03/17/13 1714 Last data filed at 03/17/13 1300  Gross per 24 hour  Intake    730 ml  Output   1200 ml  Net   -470 ml    Exam:  General: NAD. Alert. Pleasantly confused. Lungs: Coarse anterior BS. Cardiovascular: Regular rate and rhythm without murmur gallop or rub  Abdomen: Nontender, nondistended, soft, bowel sounds positive, no rebound, no ascites, no appreciable mass  Extremities: No significant cyanosis, clubbing, trace to 1+ bilateral lower extremity edema. LUE swelling improving.    Data Reviewed: Basic Metabolic Panel:  Recent Labs Lab 03/13/13 0720 03/14/13 0841 03/15/13 0500 03/16/13 0418 03/17/13 0525  NA 147* 145 149* 146* 152*  K 4.7 4.8 4.3 5.1 3.5  CL 119* 118* 120* 119* 123*  CO2 14* 13* 14* 14* 19  GLUCOSE 96 94 105* 104* 152*  BUN 61* 66* 62* 48* 36*  CREATININE 2.78* 2.57* 2.20* 1.65* 1.44*  CALCIUM 8.1* 8.5 8.5 8.3* 8.4    Liver Function Tests:  Recent Labs Lab 03/11/13 1045 03/12/13 0135  AST 15 22  ALT 8 8  ALKPHOS 89 60  BILITOT 0.6 0.4  PROT 6.8 5.3*  ALBUMIN 3.0* 2.2*   No results found for this basename: LIPASE, AMYLASE,  in the last 168 hours No results found for this basename: AMMONIA,  in the last 168 hours  CBC:  Recent Labs Lab 03/11/13 1045 03/11/13 1631 03/12/13 0135 03/13/13 0720 03/14/13 0841 03/15/13 0500  WBC 1.8* 18.1* 18.5* 10.6* 11.5* 9.7  NEUTROABS 1.6*  --   --   --   --   --   HGB 12.2* 10.6* 9.0* 9.6* 10.5* 10.9*  HCT 37.8* 34.0* 27.6* 29.6* 32.3* 34.0*  MCV 92.2 95.8 92.6 91.6 92.8 91.6  PLT 135* 127* 115* 89* 116* 126*    Cardiac Enzymes: No results found for this basename: CKTOTAL, CKMB, CKMBINDEX, TROPONINI,  in the last 168 hours BNP (last 3 results) No results found for this basename: PROBNP,  in the last 8760 hours   CBG:  Recent Labs Lab 03/16/13 0005  03/16/13 0411 03/16/13 1152 03/16/13 1553 03/16/13 2002  GLUCAP 123* 101* 114* 170* 204*    Recent Results (from the past 240 hour(s))  CULTURE, BLOOD (ROUTINE X 2)     Status: None   Collection Time    03/11/13 10:45 AM      Result Value Range Status   Specimen Description BLOOD RIGHT FOREARM   Final   Special Requests BOTTLES DRAWN AEROBIC AND ANAEROBIC 10CC   Final   Culture  Setup Time 03/11/2013 16:28   Final   Culture     Final   Value: PROTEUS MIRABILIS     Note: Gram Stain Report Called to,Read Back By and Verified With: MELISSA HANCOCK 03/12/13 AT 0240 RIDK   Report Status 03/14/2013 FINAL   Final   Organism ID, Bacteria PROTEUS MIRABILIS  Final  URINE CULTURE     Status: None   Collection Time    03/11/13 11:11 AM      Result Value Range Status   Specimen Description URINE, CATHETERIZED   Final   Special Requests NONE   Final   Culture  Setup Time 03/11/2013 12:06   Final   Colony Count >=100,000 COLONIES/ML   Final   Culture PROVIDENCIA STUARTII   Final   Report Status 03/13/2013 FINAL   Final   Organism ID, Bacteria PROVIDENCIA STUARTII   Final  CULTURE, BLOOD (ROUTINE X 2)     Status: None   Collection Time    03/11/13 11:55 AM      Result Value Range Status   Specimen Description BLOOD HAND RIGHT   Final   Special Requests BOTTLES DRAWN AEROBIC ONLY [facility]   Final   Culture  Setup Time 03/11/2013 16:28   Final   Culture NO GROWTH 5 DAYS   Final   Report Status 03/17/2013 FINAL   Final  MRSA PCR SCREENING     Status: None   Collection Time    03/11/13  3:58 PM      Result Value Range Status   MRSA by PCR NEGATIVE  NEGATIVE Final   Comment:            The GeneXpert MRSA Assay (FDA     approved for NASAL specimens     only), is one component of a     comprehensive MRSA colonization     surveillance program. It is not     intended to diagnose MRSA     infection nor to guide or     monitor treatment for     MRSA infections.     Studies: Ct Abdomen  Pelvis Wo Contrast  03/11/2013   *RADIOLOGY REPORT*  Clinical Data: Abdominal pain and fever.  CT ABDOMEN AND PELVIS WITHOUT CONTRAST  Technique:  Multidetector CT imaging of the abdomen and pelvis was performed following the standard protocol without intravenous contrast.  Comparison:  12/08/2009  Findings: Examination is limited by patient motion.  The lung bases demonstrate bibasilar atelectasis.  The heart is enlarged.  The liver is grossly normal.  The spleen is intact.  The pancreas and adrenal glands are grossly normal.  The left kidney demonstrates  simple cysts.  No hydronephrosis.  The stomach, duodenum, small bowel and colon are grossly normal without oral contrast.  There is a large amount of stool in the rectum suggesting fecal impaction.  No mesenteric or retroperitoneal mass, adenopathy, hematoma or abscess.  The aorta demonstrates advanced atherosclerotic calcifications.  An IVC filter is in place.  There is a large amount of air in the bladder along with some layering high attenuation material which could be blood.  The Foley catheter balloon is in the prostate gland and not in the bladder. The Foley catheter needs to be repositioned.  The bony structures are unremarkable.  There is advanced osteoporosis.  No destructive bony changes.  IMPRESSION:  1.  The Foley catheter is not in the bladder.  The balloon is in the prostate gland.  This needs to be repositioned. 2.  The bladder is distended.  It contains air and probable hemorrhage. 3.  No other significant intra-abdominal/intrapelvic abnormalities but the study is quite limited without contrast and due to patient motion.   Original Report Authenticated By: Rudie Meyer, M.D.   Dg Chest Portable 1 View  03/11/2013   *RADIOLOGY REPORT*  Clinical  Data: Fever.  PORTABLE CHEST - 1 VIEW  Comparison: Chest x-ray 06/14/2010.  Findings: Linear opacity at the left base favored to represent subsegmental atelectasis (underlying air space consolidation is  difficult to entirely exclude).  No definite pleural effusions. Pulmonary venous congestion, without frank pulmonary edema.  Heart size appears mildly enlarged. The patient is rotated to the left on today's exam, resulting in distortion of the mediastinal contours and reduced diagnostic sensitivity and specificity for mediastinal pathology.  Atherosclerosis in the thoracic aorta.  Left-sided pacemaker device in place with lead tip projecting over the expected location of the right ventricular apex.  Status post median sternotomy.  IMPRESSION: 1.  Low lung volumes with probable subsegmental atelectasis in the left lower lobe. 2.  Mild cardiomegaly with pulmonary venous congestion. 3.  Atherosclerosis. 4.  Postoperative changes and support apparatus, as above.   Original Report Authenticated By: Trudie Reed, M.D.    Scheduled Meds: . antiseptic oral rinse  15 mL Mouth Rinse q12n4p  . cefTRIAXone (ROCEPHIN)  IV  1 g Intravenous Q24H  . chlorhexidine  15 mL Mouth Rinse BID  . free water  200 mL Oral Q8H  . levothyroxine  60 mcg Intravenous QAC breakfast   Continuous Infusions: . dextrose 5 % 1,000 mL infusion 50 mL/hr at 03/17/13 8295    Principal Problem:   Severe sepsis with septic shock Active Problems:   Unspecified hypothyroidism   Type II or unspecified type diabetes mellitus with neurological manifestations, not stated as uncontrolled(250.60)   COPD (chronic obstructive pulmonary disease)   Anemia of chronic disease   Palliative care encounter   Pain in right extremity   Dyspnea   ARF (acute renal failure)   UTI (urinary tract infection)   Acute respiratory failure   Metabolic acidosis   Swelling of limb   Bacteremia   Hypernatremia    Time spent: 50 minutes   [facility]  [facility] Pager [phone]. If 8PM-8AM, please contact night-coverage at [email or web address], password [facility] 03/17/2013, 5:14 PM  LOS: 6 days

## 2013-03-17 NOTE — Progress Notes (Signed)
Patient [name]      DOB: 1915/06/06      WUJ:[id number]   Palliative Medicine Team at [facility] Progress Note    Subjective: Patient opens eyes to tactile/verbal stimuli, no verbal responses ,oral intake remains sporadic, requires full assistance with meals.   Filed Vitals:   03/17/13 1347  BP: 164/69  Pulse: 69  Temp: 98.6 F (37 C)  Resp: 25   Physical exam: General: Eyes closed, arouses easily CHEST: fine crackles at bases, otherwise clear CVS: RRR ABD: BS audible EXT: L arm edematous, trace-1+ pedal edema   Assessment and plan: Oral intake continues marginal at best. Discussed with patients daughter, Jerelyn Scott and brother, Colin Benton, about overall potential for future decline despite current medical interventions. Also discussed services available based on current Goals of Care, as well as future services of goals change to full comfort approach. Veroniac Lett CSW/Palliative care also communicated with family on these topics.   1) Code Status: DNR/DNI  2) Pain/Dyspnea: patient with known RLE pain-ordered Roxanol 5 mg SL every 4 hours as needed, encouraged staff to administer prior to repositioning, and also to assess for non-verbal indications of pain (eg; grimacing, agitation, restlessness)  3) Disposition: Recommend patient to be discharged to SNF with Palliative care Services to follow.   Time In Time Out Total Time Spent with Patient Total Overall Time  1:45a 2:15p 30 min 30 min   Freddie Breech, CNS-C Palliative Medicine Team [facility] Team Phone: [phone] Pager: [phone]

## 2013-03-18 DIAGNOSIS — M7989 Other specified soft tissue disorders: Secondary | ICD-10-CM

## 2013-03-18 DIAGNOSIS — E870 Hyperosmolality and hypernatremia: Secondary | ICD-10-CM

## 2013-03-18 LAB — BASIC METABOLIC PANEL
BUN: 27 mg/dL — ABNORMAL HIGH (ref 6–23)
CO2: 22 mEq/L (ref 19–32)
Calcium: 8.3 mg/dL — ABNORMAL LOW (ref 8.4–10.5)
Creatinine, Ser: 1.31 mg/dL (ref 0.50–1.35)
Glucose, Bld: 134 mg/dL — ABNORMAL HIGH (ref 70–99)

## 2013-03-18 MED ORDER — FREE WATER
200.0000 mL | Freq: Four times a day (QID) | Status: DC
  Administered 2013-03-18 – 2013-03-24 (×5): 200 mL via ORAL

## 2013-03-18 NOTE — Progress Notes (Signed)
Left upper extremity venous duplex:  No evidence of DVT.  There appears to be superficial thrombosis in the basilic, cephalic, and antecubital communicating veins.

## 2013-03-18 NOTE — Progress Notes (Signed)
[facility] PROGRESS NOTE  JUNG YURCHAK QMV:[id number] DOB: 30-May-1915 DOA: 03/11/2013 PCP: Lenoard Aden, NP  Assessment/Plan: Principal Problem:   Severe sepsis with septic shock Active Problems:   Unspecified hypothyroidism   Type II or unspecified type diabetes mellitus with neurological manifestations, not stated as uncontrolled(250.60)   COPD (chronic obstructive pulmonary disease)   Anemia of chronic disease   Palliative care encounter   Pain in right extremity   Dyspnea   ARF (acute renal failure)   UTI (urinary tract infection)   Acute respiratory failure   Metabolic acidosis   Swelling of limb   Bacteremia   Hypernatremia   Brief narrative:  79 y.o. 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  At baseline the pt is total care and not oriented. The only thing he can do is feed himself.    Assessment/Plan:  Sepsis / septic shock  Secondary to UTI and bacteremia. Currently afebrile. BP has improved w/ volume resuscitation - continue IV Rocephin while in house. On discharge will need to change to oral Keflex to complete a two-week course of antibiotic therapy. Discussed with Dr. Orvan Falconer of infectious diseases. Acute hypoxic resp failure  Due to septic shock - O2 requirements have improved. Folow. Gram negative rod Pyelonephritis/UTI/ Proteus bacteremia Grossly positive UA in pt w/ indwelling foley cath - Urine culture shows  Providencia stuartii sensitive to Rocephin, D/C Zosyn. Blood cultures positive for proteus mirabilis sensitive to rocephin - d/c'd vancomycin (MRSA negative + gram neg rods on cx)  Continue IV Rocephin while in house. On discharge will change to oral keflex to  complete 2 week course. Acute renal failure  Slowly improving. Due to hypoperfusion +/- ATN - renal function had worsened since admit - follow trend - foley was repositioned after results of CT suggesting it was not in bladder. Renal function has started to trend back down and essentially close to baseline. Hypernatremia Likely secondary to volume depletion in the setting of hypotension. Sodium levels were improving however have increased back up. Will discontinue bicarbonate from IV fluids. Increase D5W to 75 cc per hour. Follow. Continue free water flushes. Monitor closely for volume overload.  Metabolic Acidosis - lactic acidosis  Due to acute renal failure + sepsis - lactate has normalized. Improved with bicarb. D/C bicarb.  Chronic Afib  Rate controlled at the present time  DM  Follow CBG w/ SSI  CAD  MVR s/p annuloplasty  Dementia  Hx of HTN  Not an active problem at this time  COPD  Well compensated at present  BPH w/ chronic foley cath  CT suggested foley was not in bladder - cath was repositioned by RN  Hypothyroidism  Synthroid via IV  Overweight  BMI is 26.23 kg/(m^2)  LUE swelling Dopplers neg for DVT.   Code Status: NO CODE  Family Communication: spoke w/ multiple family members at the bedside at length  Disposition Plan: Transferred to MedSurg, status post palliative care consultation, DNR/DNI, continue current antibiotic regimen Family understands the risk of aspiration and wants to continue current diet.   Consultants:  Palliative Care  Procedures:  Left upper extremity Dopplers 03/18/2013  chest x-ray 03/11/2013, 03/13/2013 CT of the abdomen and pelvis  03/11/2013 Antibiotics:  7/15 Vancomycin >> 7/16  7/15 Zosyn >>> 7/17 7/15 Cefipime >>> 7/15/ 7/17 Rocephin  DVT prophylaxis:  SCD  HPI/Subjective:  The patient more alert. Sleeping.  Objective: Filed Vitals:   03/17/13 1044 03/17/13 1347 03/17/13 2217 03/18/13 0521  BP: 155/80 164/69 143/74  155/75  Pulse: 74 69 74 62  Temp: 97.3 F (36.3 C) 98.6 F (37 C) 98.2 F (36.8 C) 98.3 F (36.8 C)  TempSrc: Axillary Axillary Axillary Oral  Resp: 24 25 22 20   Height:      Weight:      SpO2: 100% 100% 100% 99%    Intake/Output Summary (Last 24 hours) at 03/18/13 1208 Last data filed at 03/18/13 0800  Gross per 24 hour  Intake    480 ml  Output    825 ml  Net   -345 ml    Exam:  General: NAD. Alert. Pleasantly confused. Lungs: Coarse anterior BS. Cardiovascular: Regular rate and rhythm without murmur gallop or rub  Abdomen: Nontender, nondistended, soft, bowel sounds positive, no rebound, no ascites, no appreciable mass  Extremities: No significant cyanosis, clubbing, trace to 1+ bilateral lower extremity edema. LUE swelling improving.    Data Reviewed: Basic Metabolic Panel:  Recent Labs Lab 03/14/13 0841 03/15/13 0500 03/16/13 0418 03/17/13 0525 03/18/13 0535  NA 145 149* 146* 152* 151*  K 4.8 4.3 5.1 3.5 4.0  CL 118* 120* 119* 123* 120*  CO2 13* 14* 14* 19 22  GLUCOSE 94 105* 104* 152* 134*  BUN 66* 62* 48* 36* 27*  CREATININE 2.57* 2.20* 1.65* 1.44* 1.31  CALCIUM 8.5 8.5 8.3* 8.4 8.3*    Liver Function Tests:  Recent Labs Lab 03/12/13 0135  AST 22  ALT 8  ALKPHOS 60  BILITOT 0.4  PROT 5.3*  ALBUMIN 2.2*   No results found for this basename: LIPASE, AMYLASE,  in the last 168 hours No results found for this basename: AMMONIA,  in the last 168 hours  CBC:  Recent Labs Lab 03/11/13 1631 03/12/13 0135 03/13/13 0720 03/14/13 0841 03/15/13 0500  WBC 18.1* 18.5* 10.6* 11.5* 9.7  HGB 10.6* 9.0* 9.6* 10.5* 10.9*  HCT 34.0* 27.6* 29.6* 32.3* 34.0*  MCV 95.8 92.6 91.6 92.8 91.6  PLT 127* 115* 89* 116* 126*    Cardiac Enzymes: No results found for this basename: CKTOTAL, CKMB, CKMBINDEX, TROPONINI,  in the last 168 hours BNP (last 3 results) No results found for this basename: PROBNP,  in the last 8760 hours   CBG:  Recent Labs Lab  03/16/13 0005 03/16/13 0411 03/16/13 1152 03/16/13 1553 03/16/13 2002  GLUCAP 123* 101* 114* 170* 204*    Recent Results (from the past 240 hour(s))  CULTURE, BLOOD (ROUTINE X 2)     Status: None   Collection Time    03/11/13 10:45 AM      Result Value Range Status   Specimen Description BLOOD RIGHT FOREARM   Final   Special Requests BOTTLES DRAWN AEROBIC AND ANAEROBIC 10CC   Final   Culture  Setup Time 03/11/2013 16:28   Final   Culture     Final   Value: PROTEUS MIRABILIS     Note: Gram Stain Report Called to,Read Back By and Verified With: MELISSA HANCOCK 03/12/13 AT 0240 RIDK   Report Status 03/14/2013 FINAL   Final   Organism ID, Bacteria PROTEUS MIRABILIS   Final  URINE CULTURE     Status: None   Collection Time    03/11/13 11:11  AM      Result Value Range Status   Specimen Description URINE, CATHETERIZED   Final   Special Requests NONE   Final   Culture  Setup Time 03/11/2013 12:06   Final   Colony Count >=100,000 COLONIES/ML   Final   Culture PROVIDENCIA STUARTII   Final   Report Status 03/13/2013 FINAL   Final   Organism ID, Bacteria PROVIDENCIA STUARTII   Final  CULTURE, BLOOD (ROUTINE X 2)     Status: None   Collection Time    03/11/13 11:55 AM      Result Value Range Status   Specimen Description BLOOD HAND RIGHT   Final   Special Requests BOTTLES DRAWN AEROBIC ONLY [facility]   Final   Culture  Setup Time 03/11/2013 16:28   Final   Culture NO GROWTH 5 DAYS   Final   Report Status 03/17/2013 FINAL   Final  MRSA PCR SCREENING     Status: None   Collection Time    03/11/13  3:58 PM      Result Value Range Status   MRSA by PCR NEGATIVE  NEGATIVE Final   Comment:            The GeneXpert MRSA Assay (FDA     approved for NASAL specimens     only), is one component of a     comprehensive MRSA colonization     surveillance program. It is not     intended to diagnose MRSA     infection nor to guide or     monitor treatment for     MRSA infections.      Studies: Ct Abdomen Pelvis Wo Contrast  03/11/2013   *RADIOLOGY REPORT*  Clinical Data: Abdominal pain and fever.  CT ABDOMEN AND PELVIS WITHOUT CONTRAST  Technique:  Multidetector CT imaging of the abdomen and pelvis was performed following the standard protocol without intravenous contrast.  Comparison:  12/08/2009  Findings: Examination is limited by patient motion.  The lung bases demonstrate bibasilar atelectasis.  The heart is enlarged.  The liver is grossly normal.  The spleen is intact.  The pancreas and adrenal glands are grossly normal.  The left kidney demonstrates  simple cysts.  No hydronephrosis.  The stomach, duodenum, small bowel and colon are grossly normal without oral contrast.  There is a large amount of stool in the rectum suggesting fecal impaction.  No mesenteric or retroperitoneal mass, adenopathy, hematoma or abscess.  The aorta demonstrates advanced atherosclerotic calcifications.  An IVC filter is in place.  There is a large amount of air in the bladder along with some layering high attenuation material which could be blood.  The Foley catheter balloon is in the prostate gland and not in the bladder. The Foley catheter needs to be repositioned.  The bony structures are unremarkable.  There is advanced osteoporosis.  No destructive bony changes.  IMPRESSION:  1.  The Foley catheter is not in the bladder.  The balloon is in the prostate gland.  This needs to be repositioned. 2.  The bladder is distended.  It contains air and probable hemorrhage. 3.  No other significant intra-abdominal/intrapelvic abnormalities but the study is quite limited without contrast and due to patient motion.   Original Report Authenticated By: Rudie Meyer, M.D.   Dg Chest Portable 1 View  03/11/2013   *RADIOLOGY REPORT*  Clinical Data: Fever.  PORTABLE CHEST - 1 VIEW  Comparison: Chest x-ray 06/14/2010.  Findings: Linear opacity at the  left base favored to represent subsegmental atelectasis (underlying  air space consolidation is difficult to entirely exclude).  No definite pleural effusions. Pulmonary venous congestion, without frank pulmonary edema.  Heart size appears mildly enlarged. The patient is rotated to the left on today's exam, resulting in distortion of the mediastinal contours and reduced diagnostic sensitivity and specificity for mediastinal pathology.  Atherosclerosis in the thoracic aorta.  Left-sided pacemaker device in place with lead tip projecting over the expected location of the right ventricular apex.  Status post median sternotomy.  IMPRESSION: 1.  Low lung volumes with probable subsegmental atelectasis in the left lower lobe. 2.  Mild cardiomegaly with pulmonary venous congestion. 3.  Atherosclerosis. 4.  Postoperative changes and support apparatus, as above.   Original Report Authenticated By: Trudie Reed, M.D.    Scheduled Meds: . antiseptic oral rinse  15 mL Mouth Rinse q12n4p  . cefTRIAXone (ROCEPHIN)  IV  1 g Intravenous Q24H  . chlorhexidine  15 mL Mouth Rinse BID  . free water  200 mL Oral Q6H  . levothyroxine  60 mcg Intravenous QAC breakfast   Continuous Infusions: . dextrose 5 % 1,000 mL infusion 50 mL/hr at 03/17/13 1610    Principal Problem:   Severe sepsis with septic shock Active Problems:   Unspecified hypothyroidism   Type II or unspecified type diabetes mellitus with neurological manifestations, not stated as uncontrolled(250.60)   COPD (chronic obstructive pulmonary disease)   Anemia of chronic disease   Palliative care encounter   Pain in right extremity   Dyspnea   ARF (acute renal failure)   UTI (urinary tract infection)   Acute respiratory failure   Metabolic acidosis   Swelling of limb   Bacteremia   Hypernatremia    Time spent: 50 minutes   [facility]  [facility] Pager [phone]. If 8PM-8AM, please contact night-coverage at [email or web address], password [facility] 03/18/2013, 12:08 PM  LOS: 7 days

## 2013-03-18 NOTE — Progress Notes (Signed)
Patient [id number] [name]      DOB: 1914-09-02      YQM:[id number]   Palliative Medicine Team at [facility] Progress Note    Subjective: Patient arouses to stimuli, but remains overall non-verbal, only taking small sips and bites, overall oral intake poor. No family at bedside   Filed Vitals:   03/18/13 1408  BP: 127/80  Pulse: 79  Temp: 97.4 F (36.3 C)  Resp: 20   Physical exam: General: Eyes closed, arouses easily  CHEST: fine crackles at bases, otherwise clear  CVS: RRR  ABD: BS audible  EXT: L arm edematous, trace-1+ pedal edema    Assessment and plan: Status unchanged, L arm doppler negative for DVT, most likly edema from IV extravasation  1) Code Status: DNR/DNI  2) Pain/Dyspnea: patient with known RLE pain-ordered Roxanol 5 mg SL every 4 hours as needed, encouraged staff to administer prior to repositioning, and also to assess for non-verbal indications of pain (eg; grimacing, agitation, restlessness)  3) Disposition: Recommend patient to be discharged to SNF with Palliative care Services to follow.    Time In Time Out Total Time Spent with Patient Total Overall Time  12:15p 12:30p 15 min 15 min    Freddie Breech, CNS-C Palliative Medicine Team [facility] Health Team Phone: [phone] Pager: [phone]

## 2013-03-19 LAB — BASIC METABOLIC PANEL
BUN: 22 mg/dL (ref 6–23)
Calcium: 8.1 mg/dL — ABNORMAL LOW (ref 8.4–10.5)
Creatinine, Ser: 1.18 mg/dL (ref 0.50–1.35)
GFR calc non Af Amer: 50 mL/min — ABNORMAL LOW (ref >90)
Glucose, Bld: 142 mg/dL — ABNORMAL HIGH (ref 70–99)

## 2013-03-19 NOTE — Clinical Social Work Note (Signed)
Clinical Social Worker continuing to follow patient for support and discharge planning needs.  Patient has been a resident at [facility] and per patient HCPOA plan is to return once medically stable.  Per MD, patient requiring 24-48 hours of further hospitalization and may be ready for discharge by the end of the week.  CSW notified facility who remains in agreement with patient return.  CSW remains available for support and to facilitate patient discharge needs once medically ready.  Macario Golds, [place] [phone]

## 2013-03-19 NOTE — Progress Notes (Signed)
[facility] PROGRESS NOTE  [name]:[id number] DOB: 1915-01-20 DOA: 03/11/2013 PCP: Lenoard Aden, NP  Assessment/Plan: Principal Problem:   Severe sepsis with septic shock Active Problems:   Unspecified hypothyroidism   Type II or unspecified type diabetes mellitus with neurological manifestations, not stated as uncontrolled(250.60)   COPD (chronic obstructive pulmonary disease)   Anemia of chronic disease   Palliative care encounter   Pain in right extremity   Dyspnea   ARF (acute renal failure)   UTI (urinary tract infection)   Acute respiratory failure   Metabolic acidosis   Swelling of limb   Bacteremia   Hypernatremia   Brief narrative:  79 y.o. 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  At baseline the pt is total care and not oriented. The only thing he can do is feed himself.    Assessment/Plan:  Sepsis / septic shock  Secondary to UTI and bacteremia. Currently afebrile. BP has improved w/ volume resuscitation - continue IV Rocephin while in house. On discharge will need to change to oral Keflex to complete a two-week course of antibiotic therapy. Discussed with Dr. Orvan Falconer of infectious diseases.  Acute hypoxic resp failure  Due to septic shock - off O2.  Gram negative rod Pyelonephritis/UTI/ Proteus bacteremia Grossly positive UA in pt w/ indwelling foley cath - Urine culture shows  Providencia stuartii sensitive to Rocephin, D/C Zosyn. Blood cultures positive for proteus mirabilis sensitive to rocephin - d/c'd vancomycin (MRSA negative + gram neg rods on cx)  Continue IV Rocephin while in house. On discharge will change to oral keflex to complete 2 week  course.  Acute renal failure  Slowly improving. Due to hypoperfusion +/- ATN - renal function had worsened since admit - follow trend - foley was repositioned after results of CT suggesting it was not in bladder. Renal function has started to trend back down and essentially close to baseline.  Hypernatremia Likely secondary to volume depletion in the setting of hypotension. Sodium levels were improving however have increased back up. Will discontinue bicarbonate from IV fluids. Increase D5W to 75 cc per hour. Follow. Continue free water flushes. Monitor closely for volume overload.  Metabolic Acidosis - lactic acidosis  Due to acute renal failure + sepsis - lactate has normalized. Improved with bicarb. D/C bicarb.   Chronic Afib  Rate controlled at the present time   DM  Follow CBG w/ SSI   CAD   MVR s/p annuloplasty   Dementia   Hx of HTN  Not an active problem at this time   COPD  Well compensated at present   BPH w/ chronic foley cath  CT suggested foley was not in bladder - cath was repositioned by RN   Hypothyroidism  Synthroid via IV   Overweight  BMI is 26.23 kg/(m^2)   LUE swelling Dopplers neg for DVT.   Code Status: DNR  Family Communication: spoke w/ multiple family members at the bedside at length  Disposition Plan: Transferred to MedSurg, status post palliative care consultation, DNR/DNI, continue current antibiotic regimen Family understands the risk of aspiration and wants to continue current diet.   Consultants:  Palliative Care   Procedures:  Left upper extremity Dopplers  03/18/2013  chest x-ray 03/11/2013, 03/13/2013 CT of the abdomen and pelvis 03/11/2013  Antibiotics:  7/15 Vancomycin >> 7/16  7/15 Zosyn >>> 7/17 7/15 Cefipime >>> 7/15/ 7/17 Rocephin  DVT prophylaxis:  SCD   HPI/Subjective:  No new c/o  Objective: Filed Vitals:   03/18/13 1821 03/18/13 2113 03/19/13 0226 03/19/13 0654  BP: 135/60 159/69 162/94 153/64  Pulse:  76 71 80 71  Temp: 98.1 F (36.7 C) 97.8 F (36.6 C) 98.2 F (36.8 C) 97.2 F (36.2 C)  TempSrc: Axillary Axillary Axillary Axillary  Resp: 20 20 20 20   Height:      Weight:      SpO2: 100% 100% 96% 100%    Intake/Output Summary (Last 24 hours) at 03/19/13 0954 Last data filed at 03/19/13 0800  Gross per 24 hour  Intake      0 ml  Output    650 ml  Net   -650 ml    Exam:  General: NAD. Alert. Pleasantly confused. Lungs: Coarse anterior BS. Cardiovascular: Regular rate and rhythm without murmur gallop or rub  Abdomen: Nontender, nondistended, soft, bowel sounds positive, no rebound, no ascites, no appreciable mass  Extremities: No significant cyanosis, clubbing, trace to 1+ bilateral lower extremity edema. LUE swelling improving.    Data Reviewed: Basic Metabolic Panel:  Recent Labs Lab 03/15/13 0500 03/16/13 0418 03/17/13 0525 03/18/13 0535 03/19/13 0458  NA 149* 146* 152* 151* 149*  K 4.3 5.1 3.5 4.0 4.0  CL 120* 119* 123* 120* 117*  CO2 14* 14* 19 22 20   GLUCOSE 105* 104* 152* 134* 142*  BUN 62* 48* 36* 27* 22  CREATININE 2.20* 1.65* 1.44* 1.31 1.18  CALCIUM 8.5 8.3* 8.4 8.3* 8.1*    Liver Function Tests: No results found for this basename: AST, ALT, ALKPHOS, BILITOT, PROT, ALBUMIN,  in the last 168 hours No results found for this basename: LIPASE, AMYLASE,  in the last 168 hours No results found for this basename: AMMONIA,  in the last 168 hours  CBC:  Recent Labs Lab 03/13/13 0720 03/14/13 0841 03/15/13 0500  WBC 10.6* 11.5* 9.7  HGB 9.6* 10.5* 10.9*  HCT 29.6* 32.3* 34.0*  MCV 91.6 92.8 91.6  PLT 89* 116* 126*    Cardiac Enzymes: No results found for this basename: CKTOTAL, CKMB, CKMBINDEX, TROPONINI,  in the last 168 hours BNP (last 3 results) No results found for this basename: PROBNP,  in the last 8760 hours   CBG:  Recent Labs Lab 03/16/13 0005 03/16/13 0411 03/16/13 1152 03/16/13 1553 03/16/13 2002  GLUCAP 123* 101* 114*  170* 204*    Recent Results (from the past 240 hour(s))  CULTURE, BLOOD (ROUTINE X 2)     Status: None   Collection Time    03/11/13 10:45 AM      Result Value Range Status   Specimen Description BLOOD RIGHT FOREARM   Final   Special Requests BOTTLES DRAWN AEROBIC AND ANAEROBIC 10CC   Final   Culture  Setup Time 03/11/2013 16:28   Final   Culture     Final   Value: PROTEUS MIRABILIS     Note: Gram Stain Report Called to,Read Back By and Verified With: MELISSA HANCOCK 03/12/13 AT 0240 RIDK   Report Status 03/14/2013 FINAL   Final   Organism ID, Bacteria PROTEUS MIRABILIS   Final  URINE CULTURE     Status: None   Collection Time    03/11/13 11:11 AM      Result Value  Range Status   Specimen Description URINE, CATHETERIZED   Final   Special Requests NONE   Final   Culture  Setup Time 03/11/2013 12:06   Final   Colony Count >=100,000 COLONIES/ML   Final   Culture PROVIDENCIA STUARTII   Final   Report Status 03/13/2013 FINAL   Final   Organism ID, Bacteria PROVIDENCIA STUARTII   Final  CULTURE, BLOOD (ROUTINE X 2)     Status: None   Collection Time    03/11/13 11:55 AM      Result Value Range Status   Specimen Description BLOOD HAND RIGHT   Final   Special Requests BOTTLES DRAWN AEROBIC ONLY [facility]   Final   Culture  Setup Time 03/11/2013 16:28   Final   Culture NO GROWTH 5 DAYS   Final   Report Status 03/17/2013 FINAL   Final  MRSA PCR SCREENING     Status: None   Collection Time    03/11/13  3:58 PM      Result Value Range Status   MRSA by PCR NEGATIVE  NEGATIVE Final   Comment:            The GeneXpert MRSA Assay (FDA     approved for NASAL specimens     only), is one component of a     comprehensive MRSA colonization     surveillance program. It is not     intended to diagnose MRSA     infection nor to guide or     monitor treatment for     MRSA infections.     Studies: Ct Abdomen Pelvis Wo Contrast  03/11/2013   *RADIOLOGY REPORT*  Clinical Data: Abdominal pain and  fever.  CT ABDOMEN AND PELVIS WITHOUT CONTRAST  Technique:  Multidetector CT imaging of the abdomen and pelvis was performed following the standard protocol without intravenous contrast.  Comparison:  12/08/2009  Findings: Examination is limited by patient motion.  The lung bases demonstrate bibasilar atelectasis.  The heart is enlarged.  The liver is grossly normal.  The spleen is intact.  The pancreas and adrenal glands are grossly normal.  The left kidney demonstrates  simple cysts.  No hydronephrosis.  The stomach, duodenum, small bowel and colon are grossly normal without oral contrast.  There is a large amount of stool in the rectum suggesting fecal impaction.  No mesenteric or retroperitoneal mass, adenopathy, hematoma or abscess.  The aorta demonstrates advanced atherosclerotic calcifications.  An IVC filter is in place.  There is a large amount of air in the bladder along with some layering high attenuation material which could be blood.  The Foley catheter balloon is in the prostate gland and not in the bladder. The Foley catheter needs to be repositioned.  The bony structures are unremarkable.  There is advanced osteoporosis.  No destructive bony changes.  IMPRESSION:  1.  The Foley catheter is not in the bladder.  The balloon is in the prostate gland.  This needs to be repositioned. 2.  The bladder is distended.  It contains air and probable hemorrhage. 3.  No other significant intra-abdominal/intrapelvic abnormalities but the study is quite limited without contrast and due to patient motion.   Original Report Authenticated By: Rudie Meyer, M.D.   Dg Chest Portable 1 View  03/11/2013   *RADIOLOGY REPORT*  Clinical Data: Fever.  PORTABLE CHEST - 1 VIEW  Comparison: Chest x-ray 06/14/2010.  Findings: Linear opacity at the left base favored to represent subsegmental atelectasis (underlying  air space consolidation is difficult to entirely exclude).  No definite pleural effusions. Pulmonary venous  congestion, without frank pulmonary edema.  Heart size appears mildly enlarged. The patient is rotated to the left on today's exam, resulting in distortion of the mediastinal contours and reduced diagnostic sensitivity and specificity for mediastinal pathology.  Atherosclerosis in the thoracic aorta.  Left-sided pacemaker device in place with lead tip projecting over the expected location of the right ventricular apex.  Status post median sternotomy.  IMPRESSION: 1.  Low lung volumes with probable subsegmental atelectasis in the left lower lobe. 2.  Mild cardiomegaly with pulmonary venous congestion. 3.  Atherosclerosis. 4.  Postoperative changes and support apparatus, as above.   Original Report Authenticated By: Trudie Reed, M.D.    Scheduled Meds: . antiseptic oral rinse  15 mL Mouth Rinse q12n4p  . cefTRIAXone (ROCEPHIN)  IV  1 g Intravenous Q24H  . chlorhexidine  15 mL Mouth Rinse BID  . free water  200 mL Oral Q6H  . levothyroxine  60 mcg Intravenous QAC breakfast   Continuous Infusions: . dextrose 5 % 1,000 mL infusion 75 mL/hr at 03/18/13 2222    Principal Problem:   Severe sepsis with septic shock Active Problems:   Unspecified hypothyroidism   Type II or unspecified type diabetes mellitus with neurological manifestations, not stated as uncontrolled(250.60)   COPD (chronic obstructive pulmonary disease)   Anemia of chronic disease   Palliative care encounter   Pain in right extremity   Dyspnea   ARF (acute renal failure)   UTI (urinary tract infection)   Acute respiratory failure   Metabolic acidosis   Swelling of limb   Bacteremia   Hypernatremia    Time spent: 35 minutes   Kalesha Irving  [facility] Pager [phone]. If 8PM-8AM, please contact night-coverage at [email or web address], password [facility] 03/19/2013, 9:54 AM  LOS: 8 days

## 2013-03-19 NOTE — Progress Notes (Signed)
Patient AO:[id number] KIAH KEAY      DOB: [birth year]      QIO:[id number]   Palliative Medicine Team at [facility] Progress Note    Subjective: Patient asleep, recently received pain medication, minimal intake at lunch per observation of tray in room. No family at bedside. Creatinine continuing to improve.  Filed Vitals:   03/19/13 0654  BP: 153/64  Pulse: 71  Temp: 97.2 F (36.2 C)  Resp: 20   Physical exam:  General: Eyes closed, arouses easily  CHEST: coarse rhonchi  CVS: RRR  ABD: BS audible  EXT: L arm edematous, trace-1+ pedal edema   Assessment and plan:  Patients status remains guarded with ongoing potential for aspiration, and minimal oral intake. Spoke with daughter Jerelyn Scott and patients brother, Colin Benton (both have HPOA), at length on potential for decline and benefits of hospice services upon discharge to SNF. Were agreeable for Palliative care services of [facility] to to follow at discharge.  1) Code Status: DNR/DNI  2) Pain/Dyspnea: patient with known RLE pain-ordered Roxanol 5 mg SL every 4 hours as needed, encouraged staff to administer prior to repositioning, and also to assess for non-verbal indications of pain (eg; grimacing, agitation, restlessness)  3) Disposition: Recommend patient to be discharged to SNF with Palliative care Services to follow. Social work involved in Architect.  Time In Time Out Total Time Spent with Patient Total Overall Time  1:15p 1:30p 15 min 15 min   Freddie Breech, CNS-C Palliative Medicine Team [facility] Team Phone: [phone] Pager: [phone]

## 2013-03-20 LAB — CBC
Hemoglobin: 9.8 g/dL — ABNORMAL LOW (ref 13.0–17.0)
MCH: 29.9 pg (ref 26.0–34.0)
MCHC: 31.7 g/dL (ref 30.0–36.0)

## 2013-03-20 LAB — BASIC METABOLIC PANEL
Calcium: 7.9 mg/dL — ABNORMAL LOW (ref 8.4–10.5)
GFR calc non Af Amer: 52 mL/min — ABNORMAL LOW (ref >90)
Glucose, Bld: 137 mg/dL — ABNORMAL HIGH (ref 70–99)
Sodium: 143 mEq/L (ref 135–145)

## 2013-03-20 MED ORDER — SCOPOLAMINE 1 MG/3DAYS TD PT72
1.0000 | MEDICATED_PATCH | TRANSDERMAL | Status: DC
  Administered 2013-03-20 – 2013-03-23 (×2): 1.5 mg via TRANSDERMAL
  Filled 2013-03-20 (×2): qty 1

## 2013-03-20 NOTE — Progress Notes (Addendum)
[facility] PROGRESS NOTE  SIMS LADAY ZOX:[id number] DOB: Oct 12, 1914 DOA: 03/11/2013 PCP: Lenoard Aden, NP  Assessment/Plan: Principal Problem:   Severe sepsis with septic shock Active Problems:   Unspecified hypothyroidism   Type II or unspecified type diabetes mellitus with neurological manifestations, not stated as uncontrolled(250.60)   COPD (chronic obstructive pulmonary disease)   Anemia of chronic disease   Palliative care encounter   Pain in right extremity   Dyspnea   ARF (acute renal failure)   UTI (urinary tract infection)   Acute respiratory failure   Metabolic acidosis   Swelling of limb   Bacteremia   Hypernatremia   Brief narrative:  79 y.o. 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  At baseline the pt is total care and not oriented. The only thing he can do is feed himself.    Assessment/Plan:  Sepsis / septic shock  Secondary to UTI and bacteremia. Currently afebrile. BP has improved w/ volume resuscitation - continue IV Rocephin while in house. On discharge will need to change to oral Keflex to complete a two-week course of antibiotic therapy. Discussed with Dr. Orvan Falconer of infectious diseases.  Acute hypoxic resp failure  Due to septic shock - off O2.  Gram negative rod Pyelonephritis/UTI/ Proteus bacteremia Grossly positive UA in pt w/ indwelling foley cath - Urine culture shows  Providencia stuartii sensitive to Rocephin, D/C Zosyn. Blood cultures positive for proteus mirabilis sensitive to rocephin - d/c'd vancomycin (MRSA negative + gram neg rods on cx)  Continue IV Rocephin while in house. On discharge will change to oral keflex to complete 2 week  course.  Acute renal failure  Slowly improving. Due to hypoperfusion +/- ATN - renal function had worsened since admit - follow trend - foley was repositioned after results of CT suggesting it was not in bladder. Renal function has started to trend back down and essentially close to baseline.  Hypernatremia Likely secondary to volume depletion in the setting of hypotension. Sodium levels were improving however have increased back up. Will discontinue bicarbonate from IV fluids. Increase D5W to 75 cc per hour. Follow. Continue free water flushes. Monitor closely for volume overload.  Metabolic Acidosis - lactic acidosis  Due to acute renal failure + sepsis - lactate has normalized. Improved with bicarb. D/C bicarb.   Chronic Afib  Rate controlled at the present time   DM  Follow CBG w/ SSI   CAD   MVR s/p annuloplasty   Dementia   Hx of HTN  Not an active problem at this time   COPD  Well compensated at present   BPH w/ chronic foley cath  CT suggested foley was not in bladder - cath was repositioned by RN   Hypothyroidism  Synthroid via IV   Overweight  BMI is 26.23 kg/(m^2)   LUE swelling Dopplers neg for DVT.   Code Status: DNR  Family Communication: spoke w/ multiple family members at the bedside at length - MPOA on phone [phone] Disposition Plan: hospice- told family we could complete the abx course but would d/c abx  Consultants:  Palliative Care   Procedures:  Left upper extremity Dopplers 03/18/2013  chest x-ray 03/11/2013, 03/13/2013 CT of the  abdomen and pelvis 03/11/2013  Antibiotics:  7/15 Vancomycin >> 7/16  7/15 Zosyn >>> 7/17 7/15 Cefipime >>> 7/15/ 7/17 Rocephin  DVT prophylaxis:  SCD   HPI/Subjective:  Eating some lunch, did not eat breakfast  Objective: Filed Vitals:   03/19/13 0654 03/19/13 1506 03/19/13 1800 03/19/13 2232  BP: 153/64 161/77 153/93 144/72  Pulse: 71 80 77 75  Temp: 97.2 F (36.2 C) 98.1 F (36.7 C) 97.8 F  (36.6 C) 97.3 F (36.3 C)  TempSrc: Axillary Axillary Axillary Axillary  Resp: 20 20 20 20   Height:      Weight:      SpO2: 100% 100% 92% 100%    Intake/Output Summary (Last 24 hours) at 03/20/13 1242 Last data filed at 03/20/13 0810  Gross per 24 hour  Intake      0 ml  Output    525 ml  Net   -525 ml    Exam:  General: NAD. Alert. Pleasantly confused. Lungs: Coarse anterior BS. Cardiovascular: Regular rate and rhythm without murmur gallop or rub  Abdomen: Nontender, nondistended, soft, bowel sounds positive, no rebound, no ascites, no appreciable mass  Extremities: No significant cyanosis, clubbing, trace to 1+ bilateral lower extremity edema. LUE swelling improving.    Data Reviewed: Basic Metabolic Panel:  Recent Labs Lab 03/16/13 0418 03/17/13 0525 03/18/13 0535 03/19/13 0458 03/20/13 0758  NA 146* 152* 151* 149* 143  K 5.1 3.5 4.0 4.0 3.7  CL 119* 123* 120* 117* 113*  CO2 14* 19 22 20 21   GLUCOSE 104* 152* 134* 142* 137*  BUN 48* 36* 27* 22 19  CREATININE 1.65* 1.44* 1.31 1.18 1.14  CALCIUM 8.3* 8.4 8.3* 8.1* 7.9*    Liver Function Tests: No results found for this basename: AST, ALT, ALKPHOS, BILITOT, PROT, ALBUMIN,  in the last 168 hours No results found for this basename: LIPASE, AMYLASE,  in the last 168 hours No results found for this basename: AMMONIA,  in the last 168 hours  CBC:  Recent Labs Lab 03/14/13 0841 03/15/13 0500 03/20/13 0758  WBC 11.5* 9.7 8.1  HGB 10.5* 10.9* 9.8*  HCT 32.3* 34.0* 30.9*  MCV 92.8 91.6 94.2  PLT 116* 126* 170    Cardiac Enzymes: No results found for this basename: CKTOTAL, CKMB, CKMBINDEX, TROPONINI,  in the last 168 hours BNP (last 3 results) No results found for this basename: PROBNP,  in the last 8760 hours   CBG:  Recent Labs Lab 03/16/13 0005 03/16/13 0411 03/16/13 1152 03/16/13 1553 03/16/13 2002  GLUCAP 123* 101* 114* 170* 204*    Recent Results (from the past 240 hour(s))  CULTURE,  BLOOD (ROUTINE X 2)     Status: None   Collection Time    03/11/13 10:45 AM      Result Value Range Status   Specimen Description BLOOD RIGHT FOREARM   Final   Special Requests BOTTLES DRAWN AEROBIC AND ANAEROBIC 10CC   Final   Culture  Setup Time 03/11/2013 16:28   Final   Culture     Final   Value: PROTEUS MIRABILIS     Note: Gram Stain Report Called to,Read Back By and Verified With: MELISSA HANCOCK 03/12/13 AT 0240 RIDK   Report Status 03/14/2013 FINAL   Final   Organism ID, Bacteria PROTEUS MIRABILIS   Final  URINE CULTURE     Status: None   Collection Time    03/11/13 11:11 AM      Result Value Range Status   Specimen  Description URINE, CATHETERIZED   Final   Special Requests NONE   Final   Culture  Setup Time 03/11/2013 12:06   Final   Colony Count >=100,000 COLONIES/ML   Final   Culture PROVIDENCIA STUARTII   Final   Report Status 03/13/2013 FINAL   Final   Organism ID, Bacteria PROVIDENCIA STUARTII   Final  CULTURE, BLOOD (ROUTINE X 2)     Status: None   Collection Time    03/11/13 11:55 AM      Result Value Range Status   Specimen Description BLOOD HAND RIGHT   Final   Special Requests BOTTLES DRAWN AEROBIC ONLY [facility]   Final   Culture  Setup Time 03/11/2013 16:28   Final   Culture NO GROWTH 5 DAYS   Final   Report Status 03/17/2013 FINAL   Final  MRSA PCR SCREENING     Status: None   Collection Time    03/11/13  3:58 PM      Result Value Range Status   MRSA by PCR NEGATIVE  NEGATIVE Final   Comment:            The GeneXpert MRSA Assay (FDA     approved for NASAL specimens     only), is one component of a     comprehensive MRSA colonization     surveillance program. It is not     intended to diagnose MRSA     infection nor to guide or     monitor treatment for     MRSA infections.     Studies: Ct Abdomen Pelvis Wo Contrast  03/11/2013   *RADIOLOGY REPORT*  Clinical Data: Abdominal pain and fever.  CT ABDOMEN AND PELVIS WITHOUT CONTRAST  Technique:   Multidetector CT imaging of the abdomen and pelvis was performed following the standard protocol without intravenous contrast.  Comparison:  12/08/2009  Findings: Examination is limited by patient motion.  The lung bases demonstrate bibasilar atelectasis.  The heart is enlarged.  The liver is grossly normal.  The spleen is intact.  The pancreas and adrenal glands are grossly normal.  The left kidney demonstrates  simple cysts.  No hydronephrosis.  The stomach, duodenum, small bowel and colon are grossly normal without oral contrast.  There is a large amount of stool in the rectum suggesting fecal impaction.  No mesenteric or retroperitoneal mass, adenopathy, hematoma or abscess.  The aorta demonstrates advanced atherosclerotic calcifications.  An IVC filter is in place.  There is a large amount of air in the bladder along with some layering high attenuation material which could be blood.  The Foley catheter balloon is in the prostate gland and not in the bladder. The Foley catheter needs to be repositioned.  The bony structures are unremarkable.  There is advanced osteoporosis.  No destructive bony changes.  IMPRESSION:  1.  The Foley catheter is not in the bladder.  The balloon is in the prostate gland.  This needs to be repositioned. 2.  The bladder is distended.  It contains air and probable hemorrhage. 3.  No other significant intra-abdominal/intrapelvic abnormalities but the study is quite limited without contrast and due to patient motion.   Original Report Authenticated By: Rudie Meyer, M.D.   Dg Chest Portable 1 View  03/11/2013   *RADIOLOGY REPORT*  Clinical Data: Fever.  PORTABLE CHEST - 1 VIEW  Comparison: Chest x-ray 06/14/2010.  Findings: Linear opacity at the left base favored to represent subsegmental atelectasis (underlying air space consolidation is difficult  to entirely exclude).  No definite pleural effusions. Pulmonary venous congestion, without frank pulmonary edema.  Heart size appears  mildly enlarged. The patient is rotated to the left on today's exam, resulting in distortion of the mediastinal contours and reduced diagnostic sensitivity and specificity for mediastinal pathology.  Atherosclerosis in the thoracic aorta.  Left-sided pacemaker device in place with lead tip projecting over the expected location of the right ventricular apex.  Status post median sternotomy.  IMPRESSION: 1.  Low lung volumes with probable subsegmental atelectasis in the left lower lobe. 2.  Mild cardiomegaly with pulmonary venous congestion. 3.  Atherosclerosis. 4.  Postoperative changes and support apparatus, as above.   Original Report Authenticated By: Trudie Reed, M.D.    Scheduled Meds: . antiseptic oral rinse  15 mL Mouth Rinse q12n4p  . cefTRIAXone (ROCEPHIN)  IV  1 g Intravenous Q24H  . chlorhexidine  15 mL Mouth Rinse BID  . free water  200 mL Oral Q6H  . levothyroxine  60 mcg Intravenous QAC breakfast  . scopolamine  1 patch Transdermal Q72H   Continuous Infusions: . dextrose 5 % 1,000 mL infusion 75 mL/hr at 03/19/13 1133    Principal Problem:   Severe sepsis with septic shock Active Problems:   Unspecified hypothyroidism   Type II or unspecified type diabetes mellitus with neurological manifestations, not stated as uncontrolled(250.60)   COPD (chronic obstructive pulmonary disease)   Anemia of chronic disease   Palliative care encounter   Pain in right extremity   Dyspnea   ARF (acute renal failure)   UTI (urinary tract infection)   Acute respiratory failure   Metabolic acidosis   Swelling of limb   Bacteremia   Hypernatremia    Time spent: 35 minutes   Ryder Chesmore  [facility] Pager [phone]. If 8PM-8AM, please contact night-coverage at [email or web address], password [facility] 03/20/2013, 12:42 PM  LOS: 9 days

## 2013-03-20 NOTE — Progress Notes (Signed)
Pt will not allow any oral care or suctioning. He will not drink water. Bites his teeth down and clamped on suction catheter and swabs. Will continue to monitor.

## 2013-03-21 DIAGNOSIS — K117 Disturbances of salivary secretion: Secondary | ICD-10-CM

## 2013-03-21 DIAGNOSIS — K1370 Unspecified lesions of oral mucosa: Secondary | ICD-10-CM

## 2013-03-21 LAB — BASIC METABOLIC PANEL
CO2: 23 mEq/L (ref 19–32)
Calcium: 7.9 mg/dL — ABNORMAL LOW (ref 8.4–10.5)
GFR calc non Af Amer: 53 mL/min — ABNORMAL LOW (ref >90)
Sodium: 140 mEq/L (ref 135–145)

## 2013-03-21 LAB — CBC
MCH: 29.7 pg (ref 26.0–34.0)
MCHC: 32 g/dL (ref 30.0–36.0)
Platelets: 177 10*3/uL (ref 150–400)
RBC: 3.17 MIL/uL — ABNORMAL LOW (ref 4.22–5.81)

## 2013-03-21 MED ORDER — CEFUROXIME AXETIL 500 MG PO TABS
500.0000 mg | ORAL_TABLET | Freq: Two times a day (BID) | ORAL | Status: DC
  Administered 2013-03-21 – 2013-03-24 (×7): 500 mg via ORAL
  Filled 2013-03-21 (×9): qty 1

## 2013-03-21 NOTE — Clinical Social Work Note (Signed)
CSW met with pt and pt's family at bedside. CSW offered support to the pt and pt's family. CSW discussed residential hospice options with family and family was given a list of local residential hospices. CSW sent pt information to residential hospice facilities. Family was understanding and agreeable that family and CSW would meet again on Monday to discuss and decide on a facility for residential hospice. Weekend social was given handoff to follow up over the weekend. CSW will continue to follow and provide support family through the discharge process.  Darlyn Chamber, MSW, LCSWA Clinical Social Work [phone]

## 2013-03-21 NOTE — Progress Notes (Signed)
[facility] called to state she had left msg with SW and was awaiting a call back regarding [name]. Stated that they only accept patients with 1-2 weeks to live at the hospice home.

## 2013-03-21 NOTE — Consult Note (Signed)
[facility] Liaison: Received request from CSW Irving Burton for family interest in [facility]. Made Irving Burton aware no [facility] availability today and no availability expected over weekend. Chart reviewed. Will follow up Monday or sooner if availability changes. Thank you. Forrestine Him LCsW [phone]

## 2013-03-21 NOTE — Progress Notes (Signed)
Patient [id number] [name]      DOB: 26-Feb-1915      OZH:[id number]   Palliative Medicine Team at [facility] Progress Note    Subjective: Patient with eyes open, verbally responsive with solitary responses at times. Oral intake remains poor.     Filed Vitals:   03/21/13 0555  BP: 125/78  Pulse: 51  Temp: 98.2 F (36.8 C)  Resp: 20   Physical exam: General: Eyes closed, arouses easily  CHEST: coarse rhonchi  CVS: RRR  ABD: BS audible  EXT: L arm edematous, trace-1+ pedal edema  Assessment and plan: Dr Benjamine Mola discussed overall poor prognosis with HPOA, yesterday, decision made for transition to residential hospice.  1) Code Status: DNR/DNI 2) Pain/Dyspnea: Roxanol 5 mg SL every 4 hours as needed 3) Terminal Secretions: Scopolamine patch ordered 4) Disposition: Awaiting residential hospice choice to be offered.  Time In Time Out Total Time Spent with Patient Total Overall Time  9:00a 9:15a 15 min 15 min   Freddie Breech, CNS-C Palliative Medicine Team [facility] Team Phone: [phone] Pager: [phone]

## 2013-03-21 NOTE — Progress Notes (Signed)
[facility] PROGRESS NOTE  [name] WUJ:[id number] DOB: Oct 28, 1914 DOA: 03/11/2013 PCP: Lenoard Aden, NP  Assessment/Plan: Principal Problem:   Severe sepsis with septic shock Active Problems:   Unspecified hypothyroidism   Type II or unspecified type diabetes mellitus with neurological manifestations, not stated as uncontrolled(250.60)   COPD (chronic obstructive pulmonary disease)   Anemia of chronic disease   Palliative care encounter   Pain in right extremity   Dyspnea   ARF (acute renal failure)   UTI (urinary tract infection)   Acute respiratory failure   Metabolic acidosis   Swelling of limb   Bacteremia   Hypernatremia   Increased oropharyngeal secretions   Brief narrative:  79 y.o. 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  At baseline the pt is total care and not oriented. The only thing he can do is feed himself.    Assessment/Plan:  Sepsis / septic shock  Secondary to UTI and bacteremia. Currently afebrile. BP has improved w/ volume resuscitation -  change to oral Ceftin to complete a two-week course of antibiotic therapy. Discussed with Dr. Orvan Falconer of infectious diseases.  Acute hypoxic resp failure  Due to septic shock - off O2.  Gram negative rod Pyelonephritis/UTI/ Proteus bacteremia Grossly positive UA in pt w/ indwelling foley cath - Urine culture shows  Providencia stuartii sensitive to Rocephin, D/C Zosyn. Blood cultures positive for proteus mirabilis sensitive to rocephin - d/c'd vancomycin (MRSA negative + gram neg rods on cx)  -see above  Acute renal failure  Slowly improving. Due to hypoperfusion +/- ATN - renal function had worsened since admit -  follow trend - foley was repositioned after results of CT suggesting it was not in bladder. Renal function has started to trend back down and essentially close to baseline.  Hypernatremia Likely secondary to volume depletion in the setting of hypotension.  Continue free water.  Metabolic Acidosis - lactic acidosis  Due to acute renal failure + sepsis - lactate has normalized. Improved with bicarb. D/C bicarb.   Chronic Afib  Rate controlled at the present time   DM  Follow CBG w/ SSI   CAD   MVR s/p annuloplasty   Dementia   Hx of HTN  Not an active problem at this time   COPD  Well compensated at present   BPH w/ chronic foley cath  CT suggested foley was not in bladder - cath was repositioned by RN   Hypothyroidism  Synthroid via IV   Overweight  BMI is 26.23 kg/(m^2)   LUE swelling Dopplers neg for DVT.   Code Status: DNR  Family Communication: spoke w/ multiple family members at the bedside at length - MPOA on phone [phone] Disposition Plan: hospice-  Consultants:  Palliative Care   Procedures:  Left upper extremity Dopplers 03/18/2013  chest x-ray 03/11/2013, 03/13/2013 CT of the abdomen and pelvis 03/11/2013  Antibiotics:  7/15 Vancomycin >> 7/16  7/15 Zosyn >>> 7/17 7/15 Cefipime >>> 7/15/ 7/17 Rocephin  DVT prophylaxis:  SCD   HPI/Subjective:  Not wanting to eat  Objective: Filed Vitals:   03/19/13 2232 03/20/13 1304 03/20/13 2212 03/21/13 0555  BP: 144/72 160/74 137/70 125/78  Pulse: 75 64 61 51  Temp: 97.3 F (36.3 C) 98 F (36.7 C) 98.4 F (36.9 C) 98.2 F (36.8 C)  TempSrc: Axillary Axillary Axillary Axillary  Resp: 20 20 20 20   Height:      Weight:      SpO2: 100% 100% 100% 100%   No intake or output data in the 24 hours ending 03/21/13 1019  Exam:  General: NAD. Alert. Pleasantly confused. Lungs: Coarse anterior BS. Cardiovascular: Regular rate and rhythm without murmur gallop or rub  Abdomen: Nontender,  nondistended, soft, bowel sounds positive, no rebound, no ascites, no appreciable mass  Extremities: No significant cyanosis, clubbing, trace to 1+ bilateral lower extremity edema. LUE swelling improving.    Data Reviewed: Basic Metabolic Panel:  Recent Labs Lab 03/17/13 0525 03/18/13 0535 03/19/13 0458 03/20/13 0758 03/21/13 0535  NA 152* 151* 149* 143 140  K 3.5 4.0 4.0 3.7 3.9  CL 123* 120* 117* 113* 111  CO2 19 22 20 21 23   GLUCOSE 152* 134* 142* 137* 133*  BUN 36* 27* 22 19 18   CREATININE 1.44* 1.31 1.18 1.14 1.13  CALCIUM 8.4 8.3* 8.1* 7.9* 7.9*    Liver Function Tests: No results found for this basename: AST, ALT, ALKPHOS, BILITOT, PROT, ALBUMIN,  in the last 168 hours No results found for this basename: LIPASE, AMYLASE,  in the last 168 hours No results found for this basename: AMMONIA,  in the last 168 hours  CBC:  Recent Labs Lab 03/15/13 0500 03/20/13 0758 03/21/13 0535  WBC 9.7 8.1 8.0  HGB 10.9* 9.8* 9.4*  HCT 34.0* 30.9* 29.4*  MCV 91.6 94.2 92.7  PLT 126* 170 177    Cardiac Enzymes: No results found for this basename: CKTOTAL, CKMB, CKMBINDEX, TROPONINI,  in the last 168 hours BNP (last 3 results) No results found for this basename: PROBNP,  in the last 8760 hours   CBG:  Recent Labs Lab 03/16/13 0005 03/16/13 0411 03/16/13 1152 03/16/13 1553 03/16/13 2002  GLUCAP 123* 101* 114* 170* 204*    Recent Results (from the past 240 hour(s))  CULTURE, BLOOD (ROUTINE X 2)     Status: None   Collection Time    03/11/13 10:45 AM      Result Value Range Status   Specimen Description BLOOD RIGHT FOREARM   Final   Special Requests BOTTLES DRAWN AEROBIC AND ANAEROBIC 10CC   Final   Culture  Setup Time 03/11/2013 16:28   Final   Culture     Final   Value: PROTEUS MIRABILIS     Note: Gram Stain Report Called to,Read Back By and Verified With: MELISSA HANCOCK 03/12/13 AT 0240 RIDK   Report Status 03/14/2013 FINAL   Final   Organism ID, Bacteria  PROTEUS MIRABILIS   Final  URINE CULTURE     Status: None   Collection Time    03/11/13 11:11 AM      Result Value Range Status   Specimen Description URINE, CATHETERIZED   Final   Special Requests NONE   Final   Culture  Setup Time 03/11/2013 12:06   Final   Colony Count >=100,000 COLONIES/ML   Final   Culture PROVIDENCIA STUARTII   Final   Report Status 03/13/2013 FINAL   Final   Organism ID, Bacteria PROVIDENCIA STUARTII   Final  CULTURE, BLOOD (ROUTINE X 2)     Status: None   Collection Time    03/11/13 11:55 AM      Result Value Range Status   Specimen Description BLOOD  HAND RIGHT   Final   Special Requests BOTTLES DRAWN AEROBIC ONLY [facility]   Final   Culture  Setup Time 03/11/2013 16:28   Final   Culture NO GROWTH 5 DAYS   Final   Report Status 03/17/2013 FINAL   Final  MRSA PCR SCREENING     Status: None   Collection Time    03/11/13  3:58 PM      Result Value Range Status   MRSA by PCR NEGATIVE  NEGATIVE Final   Comment:            The GeneXpert MRSA Assay (FDA     approved for NASAL specimens     only), is one component of a     comprehensive MRSA colonization     surveillance program. It is not     intended to diagnose MRSA     infection nor to guide or     monitor treatment for     MRSA infections.     Studies: Ct Abdomen Pelvis Wo Contrast  03/11/2013   *RADIOLOGY REPORT*  Clinical Data: Abdominal pain and fever.  CT ABDOMEN AND PELVIS WITHOUT CONTRAST  Technique:  Multidetector CT imaging of the abdomen and pelvis was performed following the standard protocol without intravenous contrast.  Comparison:  12/08/2009  Findings: Examination is limited by patient motion.  The lung bases demonstrate bibasilar atelectasis.  The heart is enlarged.  The liver is grossly normal.  The spleen is intact.  The pancreas and adrenal glands are grossly normal.  The left kidney demonstrates  simple cysts.  No hydronephrosis.  The stomach, duodenum, small bowel and colon are grossly  normal without oral contrast.  There is a large amount of stool in the rectum suggesting fecal impaction.  No mesenteric or retroperitoneal mass, adenopathy, hematoma or abscess.  The aorta demonstrates advanced atherosclerotic calcifications.  An IVC filter is in place.  There is a large amount of air in the bladder along with some layering high attenuation material which could be blood.  The Foley catheter balloon is in the prostate gland and not in the bladder. The Foley catheter needs to be repositioned.  The bony structures are unremarkable.  There is advanced osteoporosis.  No destructive bony changes.  IMPRESSION:  1.  The Foley catheter is not in the bladder.  The balloon is in the prostate gland.  This needs to be repositioned. 2.  The bladder is distended.  It contains air and probable hemorrhage. 3.  No other significant intra-abdominal/intrapelvic abnormalities but the study is quite limited without contrast and due to patient motion.   Original Report Authenticated By: Rudie Meyer, M.D.   Dg Chest Portable 1 View  03/11/2013   *RADIOLOGY REPORT*  Clinical Data: Fever.  PORTABLE CHEST - 1 VIEW  Comparison: Chest x-ray 06/14/2010.  Findings: Linear opacity at the left base favored to represent subsegmental atelectasis (underlying air space consolidation is difficult to entirely exclude).  No definite pleural effusions. Pulmonary venous congestion, without frank pulmonary edema.  Heart size appears mildly enlarged. The patient is rotated to the left on today's exam, resulting in distortion of the mediastinal contours and reduced diagnostic sensitivity and specificity for mediastinal pathology.  Atherosclerosis in the thoracic aorta.  Left-sided pacemaker device in place with lead tip projecting over the expected location of the right ventricular apex.  Status post median sternotomy.  IMPRESSION: 1.  Low lung volumes with probable subsegmental atelectasis in the left lower lobe. 2.  Mild cardiomegaly  with pulmonary venous congestion. 3.  Atherosclerosis. 4.  Postoperative changes and support apparatus, as above.   Original Report Authenticated By: Trudie Reed, M.D.    Scheduled Meds: . antiseptic oral rinse  15 mL Mouth Rinse q12n4p  . cefTRIAXone (ROCEPHIN)  IV  1 g Intravenous Q24H  . chlorhexidine  15 mL Mouth Rinse BID  . free water  200 mL Oral Q6H  . levothyroxine  60 mcg Intravenous QAC breakfast  . scopolamine  1 patch Transdermal Q72H   Continuous Infusions: . dextrose 5 % 1,000 mL infusion 75 mL/hr at 03/20/13 1729    Principal Problem:   Severe sepsis with septic shock Active Problems:   Unspecified hypothyroidism   Type II or unspecified type diabetes mellitus with neurological manifestations, not stated as uncontrolled(250.60)   COPD (chronic obstructive pulmonary disease)   Anemia of chronic disease   Palliative care encounter   Pain in right extremity   Dyspnea   ARF (acute renal failure)   UTI (urinary tract infection)   Acute respiratory failure   Metabolic acidosis   Swelling of limb   Bacteremia   Hypernatremia   Increased oropharyngeal secretions    Time spent: 35 minutes   VANN, JESSICA  [facility] Pager [phone]. If 8PM-8AM, please contact night-coverage at [email or web address], password [facility] 03/21/2013, 10:19 AM  LOS: 10 days

## 2013-03-22 MED ORDER — HALOPERIDOL 1 MG PO TABS
1.0000 mg | ORAL_TABLET | Freq: Four times a day (QID) | ORAL | Status: DC | PRN
  Administered 2013-03-22 – 2013-03-23 (×2): 1 mg via ORAL
  Filled 2013-03-22 (×2): qty 1

## 2013-03-22 NOTE — Progress Notes (Signed)
[facility] PROGRESS NOTE  TOA MIA ZOX:[id number] DOB: Jul 08, 1915 DOA: 03/11/2013 PCP: Lenoard Aden, NP  Assessment/Plan: Principal Problem:   Severe sepsis with septic shock Active Problems:   Unspecified hypothyroidism   Type II or unspecified type diabetes mellitus with neurological manifestations, not stated as uncontrolled(250.60)   COPD (chronic obstructive pulmonary disease)   Anemia of chronic disease   Palliative care encounter   Pain in right extremity   Dyspnea   ARF (acute renal failure)   UTI (urinary tract infection)   Acute respiratory failure   Metabolic acidosis   Swelling of limb   Bacteremia   Hypernatremia   Increased oropharyngeal secretions   Brief narrative:  79 y.o. 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  At baseline the pt is total care and not oriented. The only thing he can do is feed himself.    Assessment/Plan:  Sepsis / septic shock  Secondary to UTI and bacteremia. Currently afebrile. BP has improved w/ volume resuscitation -  change to oral Ceftin to complete a two-week course of antibiotic therapy. Discussed with Dr. Orvan Falconer of infectious diseases.  Acute hypoxic resp failure  Due to septic shock - off O2.  Gram negative rod Pyelonephritis/UTI/ Proteus bacteremia Grossly positive UA in pt w/ indwelling foley cath - Urine culture shows  Providencia stuartii sensitive to Rocephin, D/C Zosyn. Blood cultures positive for proteus mirabilis sensitive to rocephin - d/c'd vancomycin (MRSA negative + gram neg rods on cx)  -see above  Acute renal failure  Slowly improving. Due to hypoperfusion +/- ATN - renal function had worsened since admit -  follow trend - foley was repositioned after results of CT suggesting it was not in bladder. Renal function has started to trend back down and essentially close to baseline.  Hypernatremia Likely secondary to volume depletion in the setting of hypotension.  Continue free water.- patient not able to take much in-suspect he is near his end of life  aggitation -PO haldol PRN  Metabolic Acidosis - lactic acidosis  Due to acute renal failure + sepsis - lactate has normalized. Improved with bicarb. D/C bicarb.   Chronic Afib  Rate controlled at the present time   DM  Follow CBG w/ SSI   CAD   MVR s/p annuloplasty   Dementia   Hx of HTN  Not an active problem at this time   COPD  Well compensated at present   BPH w/ chronic foley cath  CT suggested foley was not in bladder - cath was repositioned by RN   Hypothyroidism  Synthroid via IV   Overweight  BMI is 26.23 kg/(m^2)   LUE swelling Dopplers neg for DVT.   Code Status: DNR  Family Communication: spoke w/ multiple family members at the bedside at length - MPOA on phone [phone] Disposition Plan: hospice-  Consultants:  Palliative Care   Procedures:  Left upper extremity Dopplers 03/18/2013  chest x-ray 03/11/2013, 03/13/2013 CT of the abdomen and pelvis 03/11/2013  Antibiotics:  7/15 Vancomycin >> 7/16  7/15 Zosyn >>> 7/17 7/15 Cefipime >>> 7/15/ 7/17 Rocephin  DVT prophylaxis:  SCD   HPI/Subjective:  Eating very little- agitated some  Objective: Filed Vitals:  03/21/13 0555 03/21/13 1325 03/21/13 2152 03/22/13 0550  BP: 125/78 117/54 117/60 125/63  Pulse: 51 50 52 54  Temp: 98.2 F (36.8 C) 98.1 F (36.7 C) 98.7 F (37.1 C) 98 F (36.7 C)  TempSrc: Axillary Axillary Axillary Axillary  Resp: 20 20 20 20   Height:      Weight:      SpO2: 100% 100% 98% 100%    Intake/Output Summary (Last 24 hours) at 03/22/13 1039 Last data filed at 03/22/13 0855  Gross per 24 hour  Intake     95 ml   Output      0 ml  Net     95 ml    Exam:  General: NAD. Alert. Pleasantly confused. Lungs: Coarse anterior BS. Cardiovascular: Regular rate and rhythm without murmur gallop or rub  Abdomen: Nontender, nondistended, soft, bowel sounds positive, no rebound, no ascites, no appreciable mass  Extremities: No significant cyanosis, clubbing, trace to 1+ bilateral lower extremity edema. LUE swelling improving.    Data Reviewed: Basic Metabolic Panel:  Recent Labs Lab 03/17/13 0525 03/18/13 0535 03/19/13 0458 03/20/13 0758 03/21/13 0535  NA 152* 151* 149* 143 140  K 3.5 4.0 4.0 3.7 3.9  CL 123* 120* 117* 113* 111  CO2 19 22 20 21 23   GLUCOSE 152* 134* 142* 137* 133*  BUN 36* 27* 22 19 18   CREATININE 1.44* 1.31 1.18 1.14 1.13  CALCIUM 8.4 8.3* 8.1* 7.9* 7.9*    Liver Function Tests: No results found for this basename: AST, ALT, ALKPHOS, BILITOT, PROT, ALBUMIN,  in the last 168 hours No results found for this basename: LIPASE, AMYLASE,  in the last 168 hours No results found for this basename: AMMONIA,  in the last 168 hours  CBC:  Recent Labs Lab 03/20/13 0758 03/21/13 0535  WBC 8.1 8.0  HGB 9.8* 9.4*  HCT 30.9* 29.4*  MCV 94.2 92.7  PLT 170 177    Cardiac Enzymes: No results found for this basename: CKTOTAL, CKMB, CKMBINDEX, TROPONINI,  in the last 168 hours BNP (last 3 results) No results found for this basename: PROBNP,  in the last 8760 hours   CBG:  Recent Labs Lab 03/16/13 0005 03/16/13 0411 03/16/13 1152 03/16/13 1553 03/16/13 2002  GLUCAP 123* 101* 114* 170* 204*    No results found for this or any previous visit (from the past 240 hour(s)).   Studies: Ct Abdomen Pelvis Wo Contrast  03/11/2013   *RADIOLOGY REPORT*  Clinical Data: Abdominal pain and fever.  CT ABDOMEN AND PELVIS WITHOUT CONTRAST  Technique:  Multidetector CT imaging of the abdomen and pelvis was performed following the standard protocol without intravenous contrast.   Comparison:  12/08/2009  Findings: Examination is limited by patient motion.  The lung bases demonstrate bibasilar atelectasis.  The heart is enlarged.  The liver is grossly normal.  The spleen is intact.  The pancreas and adrenal glands are grossly normal.  The left kidney demonstrates  simple cysts.  No hydronephrosis.  The stomach, duodenum, small bowel and colon are grossly normal without oral contrast.  There is a large amount of stool in the rectum suggesting fecal impaction.  No mesenteric or retroperitoneal mass, adenopathy, hematoma or abscess.  The aorta demonstrates advanced atherosclerotic calcifications.  An IVC filter is in place.  There is a large amount of air in the bladder along with some layering high attenuation material which could be blood.  The Foley catheter balloon is in the prostate gland and not in  the bladder. The Foley catheter needs to be repositioned.  The bony structures are unremarkable.  There is advanced osteoporosis.  No destructive bony changes.  IMPRESSION:  1.  The Foley catheter is not in the bladder.  The balloon is in the prostate gland.  This needs to be repositioned. 2.  The bladder is distended.  It contains air and probable hemorrhage. 3.  No other significant intra-abdominal/intrapelvic abnormalities but the study is quite limited without contrast and due to patient motion.   Original Report Authenticated By: Rudie Meyer, M.D.   Dg Chest Portable 1 View  03/11/2013   *RADIOLOGY REPORT*  Clinical Data: Fever.  PORTABLE CHEST - 1 VIEW  Comparison: Chest x-ray 06/14/2010.  Findings: Linear opacity at the left base favored to represent subsegmental atelectasis (underlying air space consolidation is difficult to entirely exclude).  No definite pleural effusions. Pulmonary venous congestion, without frank pulmonary edema.  Heart size appears mildly enlarged. The patient is rotated to the left on today's exam, resulting in distortion of the mediastinal contours and reduced  diagnostic sensitivity and specificity for mediastinal pathology.  Atherosclerosis in the thoracic aorta.  Left-sided pacemaker device in place with lead tip projecting over the expected location of the right ventricular apex.  Status post median sternotomy.  IMPRESSION: 1.  Low lung volumes with probable subsegmental atelectasis in the left lower lobe. 2.  Mild cardiomegaly with pulmonary venous congestion. 3.  Atherosclerosis. 4.  Postoperative changes and support apparatus, as above.   Original Report Authenticated By: Trudie Reed, M.D.    Scheduled Meds: . antiseptic oral rinse  15 mL Mouth Rinse q12n4p  . cefUROXime  500 mg Oral BID WC  . chlorhexidine  15 mL Mouth Rinse BID  . free water  200 mL Oral Q6H  . levothyroxine  60 mcg Intravenous QAC breakfast  . scopolamine  1 patch Transdermal Q72H   Continuous Infusions:    Principal Problem:   Severe sepsis with septic shock Active Problems:   Unspecified hypothyroidism   Type II or unspecified type diabetes mellitus with neurological manifestations, not stated as uncontrolled(250.60)   COPD (chronic obstructive pulmonary disease)   Anemia of chronic disease   Palliative care encounter   Pain in right extremity   Dyspnea   ARF (acute renal failure)   UTI (urinary tract infection)   Acute respiratory failure   Metabolic acidosis   Swelling of limb   Bacteremia   Hypernatremia   Increased oropharyngeal secretions    Time spent: 35 minutes   Tanny Harnack  [facility] Pager [phone]. If 8PM-8AM, please contact night-coverage at [email or web address], password [facility] 03/22/2013, 10:39 AM  LOS: 11 days

## 2013-03-22 NOTE — Consult Note (Signed)
[facility] Liaison: Continuing to follow for interest in [facility] Unfortunately no [facility] availability expected over weekend. Weekend CSW aware. Will update weekday CSW Monday if Mr. Gantt still in hospital. Thank you. Forrestine Him LCSW [phone]

## 2013-03-23 MED ORDER — HALOPERIDOL 0.5 MG PO TABS
0.5000 mg | ORAL_TABLET | Freq: Four times a day (QID) | ORAL | Status: DC | PRN
  Administered 2013-03-23 – 2013-03-24 (×2): 0.5 mg via ORAL
  Filled 2013-03-23 (×2): qty 1

## 2013-03-23 NOTE — Progress Notes (Signed)
[facility] PROGRESS NOTE  [name] WUJ:[id number] DOB: 21-Apr-1915 DOA: 03/11/2013 PCP: Lenoard Aden, NP  Assessment/Plan: Principal Problem:   Severe sepsis with septic shock Active Problems:   Unspecified hypothyroidism   Type II or unspecified type diabetes mellitus with neurological manifestations, not stated as uncontrolled(250.60)   COPD (chronic obstructive pulmonary disease)   Anemia of chronic disease   Palliative care encounter   Pain in right extremity   Dyspnea   ARF (acute renal failure)   UTI (urinary tract infection)   Acute respiratory failure   Metabolic acidosis   Swelling of limb   Bacteremia   Hypernatremia   Increased oropharyngeal secretions   Brief narrative:  79 y.o. 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  At baseline the pt is total care and not oriented. The only thing he can do is feed himself.    Assessment/Plan:  Sepsis / septic shock  Secondary to UTI and bacteremia. Currently afebrile. BP has improved w/ volume resuscitation -  change to oral Ceftin to complete a two-week course of antibiotic therapy. Discussed with Dr. Orvan Falconer of infectious diseases.  Acute hypoxic resp failure  Due to septic shock - off O2.  Gram negative rod Pyelonephritis/UTI/ Proteus bacteremia Grossly positive UA in pt w/ indwelling foley cath - Urine culture shows  Providencia stuartii sensitive to Rocephin, D/C Zosyn. Blood cultures positive for proteus mirabilis sensitive to rocephin - d/c'd vancomycin (MRSA negative + gram neg rods on cx)  -see above  Acute renal failure  Slowly improving. Due to hypoperfusion +/- ATN - renal function had worsened since admit -  follow trend - foley was repositioned after results of CT suggesting it was not in bladder. Renal function has started to trend back down and essentially close to baseline.  Hypernatremia Likely secondary to volume depletion in the setting of hypotension.  Continue free water.- patient not able to take much in-suspect he is near his end of life  aggitation -PO haldol PRN  Metabolic Acidosis - lactic acidosis  Due to acute renal failure + sepsis - lactate has normalized. Improved with bicarb. D/C bicarb.   Chronic Afib  Rate controlled at the present time   DM  Follow CBG w/ SSI   CAD   MVR s/p annuloplasty   Dementia   Hx of HTN  Not an active problem at this time   COPD  Well compensated at present   BPH w/ chronic foley cath  CT suggested foley was not in bladder - cath was repositioned by RN   Hypothyroidism  Synthroid via IV   Overweight  BMI is 26.23 kg/(m^2)   LUE swelling Dopplers neg for DVT.   Code Status: DNR  Family Communication: spoke w/ multiple family members at the bedside at length - MPOA on phone [phone] Disposition Plan: hospice-  Consultants:  Palliative Care   Procedures:  Left upper extremity Dopplers 03/18/2013  chest x-ray 03/11/2013, 03/13/2013 CT of the abdomen and pelvis 03/11/2013  Antibiotics:  7/15 Vancomycin >> 7/16  7/15 Zosyn >>> 7/17 7/15 Cefipime >>> 7/15/ 7/17 Rocephin  DVT prophylaxis:  SCD   HPI/Subjective:  agitation was helped by haldol Taking in some PO -lots of  coughing after   Objective: Filed Vitals:   03/22/13 1345 03/22/13 1840 03/22/13 2149 03/23/13 0550  BP: 133/69 151/107 105/65 137/68  Pulse: 59 74 51 51  Temp: 98.2 F (36.8 C) 98.5 F (36.9 C) 98 F (36.7 C) 98.2 F (36.8 C)  TempSrc: Axillary Oral Axillary Axillary  Resp: 20 20 20 20   Height:      Weight:      SpO2: 97% 100% 100% 99%    Intake/Output Summary (Last 24 hours) at 03/23/13 1014 Last data filed at 03/22/13 1800   Gross per 24 hour  Intake     20 ml  Output    350 ml  Net   -330 ml    Exam:  General: NAD. Alert. Pleasantly confused. Lungs: Coarse anterior BS. Cardiovascular: Regular rate and rhythm without murmur gallop or rub  Abdomen: Nontender, nondistended, soft, bowel sounds positive, no rebound, no ascites, no appreciable mass  Extremities: No significant cyanosis, clubbing, trace to 1+ bilateral lower extremity edema. LUE swelling improving.    Data Reviewed: Basic Metabolic Panel:  Recent Labs Lab 03/17/13 0525 03/18/13 0535 03/19/13 0458 03/20/13 0758 03/21/13 0535  NA 152* 151* 149* 143 140  K 3.5 4.0 4.0 3.7 3.9  CL 123* 120* 117* 113* 111  CO2 19 22 20 21 23   GLUCOSE 152* 134* 142* 137* 133*  BUN 36* 27* 22 19 18   CREATININE 1.44* 1.31 1.18 1.14 1.13  CALCIUM 8.4 8.3* 8.1* 7.9* 7.9*    Liver Function Tests: No results found for this basename: AST, ALT, ALKPHOS, BILITOT, PROT, ALBUMIN,  in the last 168 hours No results found for this basename: LIPASE, AMYLASE,  in the last 168 hours No results found for this basename: AMMONIA,  in the last 168 hours  CBC:  Recent Labs Lab 03/20/13 0758 03/21/13 0535  WBC 8.1 8.0  HGB 9.8* 9.4*  HCT 30.9* 29.4*  MCV 94.2 92.7  PLT 170 177    Cardiac Enzymes: No results found for this basename: CKTOTAL, CKMB, CKMBINDEX, TROPONINI,  in the last 168 hours BNP (last 3 results) No results found for this basename: PROBNP,  in the last 8760 hours   CBG:  Recent Labs Lab 03/16/13 1152 03/16/13 1553 03/16/13 2002  GLUCAP 114* 170* 204*    No results found for this or any previous visit (from the past 240 hour(s)).   Studies: Ct Abdomen Pelvis Wo Contrast  03/11/2013   *RADIOLOGY REPORT*  Clinical Data: Abdominal pain and fever.  CT ABDOMEN AND PELVIS WITHOUT CONTRAST  Technique:  Multidetector CT imaging of the abdomen and pelvis was performed following the standard protocol without intravenous contrast.  Comparison:   12/08/2009  Findings: Examination is limited by patient motion.  The lung bases demonstrate bibasilar atelectasis.  The heart is enlarged.  The liver is grossly normal.  The spleen is intact.  The pancreas and adrenal glands are grossly normal.  The left kidney demonstrates  simple cysts.  No hydronephrosis.  The stomach, duodenum, small bowel and colon are grossly normal without oral contrast.  There is a large amount of stool in the rectum suggesting fecal impaction.  No mesenteric or retroperitoneal mass, adenopathy, hematoma or abscess.  The aorta demonstrates advanced atherosclerotic calcifications.  An IVC filter is in place.  There is a large amount of air in the bladder along with some layering high attenuation material which could be blood.  The Foley catheter balloon is in the prostate gland and not in the  bladder. The Foley catheter needs to be repositioned.  The bony structures are unremarkable.  There is advanced osteoporosis.  No destructive bony changes.  IMPRESSION:  1.  The Foley catheter is not in the bladder.  The balloon is in the prostate gland.  This needs to be repositioned. 2.  The bladder is distended.  It contains air and probable hemorrhage. 3.  No other significant intra-abdominal/intrapelvic abnormalities but the study is quite limited without contrast and due to patient motion.   Original Report Authenticated By: Rudie Meyer, M.D.   Dg Chest Portable 1 View  03/11/2013   *RADIOLOGY REPORT*  Clinical Data: Fever.  PORTABLE CHEST - 1 VIEW  Comparison: Chest x-ray 06/14/2010.  Findings: Linear opacity at the left base favored to represent subsegmental atelectasis (underlying air space consolidation is difficult to entirely exclude).  No definite pleural effusions. Pulmonary venous congestion, without frank pulmonary edema.  Heart size appears mildly enlarged. The patient is rotated to the left on today's exam, resulting in distortion of the mediastinal contours and reduced diagnostic  sensitivity and specificity for mediastinal pathology.  Atherosclerosis in the thoracic aorta.  Left-sided pacemaker device in place with lead tip projecting over the expected location of the right ventricular apex.  Status post median sternotomy.  IMPRESSION: 1.  Low lung volumes with probable subsegmental atelectasis in the left lower lobe. 2.  Mild cardiomegaly with pulmonary venous congestion. 3.  Atherosclerosis. 4.  Postoperative changes and support apparatus, as above.   Original Report Authenticated By: Trudie Reed, M.D.    Scheduled Meds: . antiseptic oral rinse  15 mL Mouth Rinse q12n4p  . cefUROXime  500 mg Oral BID WC  . chlorhexidine  15 mL Mouth Rinse BID  . free water  200 mL Oral Q6H  . levothyroxine  60 mcg Intravenous QAC breakfast  . scopolamine  1 patch Transdermal Q72H   Continuous Infusions:    Principal Problem:   Severe sepsis with septic shock Active Problems:   Unspecified hypothyroidism   Type II or unspecified type diabetes mellitus with neurological manifestations, not stated as uncontrolled(250.60)   COPD (chronic obstructive pulmonary disease)   Anemia of chronic disease   Palliative care encounter   Pain in right extremity   Dyspnea   ARF (acute renal failure)   UTI (urinary tract infection)   Acute respiratory failure   Metabolic acidosis   Swelling of limb   Bacteremia   Hypernatremia   Increased oropharyngeal secretions    Time spent: 25 minutes   Khadim Lundberg  [facility] Pager [phone]. If 8PM-8AM, please contact night-coverage at [email or web address], password [facility] 03/23/2013, 10:14 AM  LOS: 12 days

## 2013-03-24 MED ORDER — MORPHINE SULFATE (CONCENTRATE) 10 MG /0.5 ML PO SOLN
5.0000 mg | ORAL | Status: DC | PRN
  Administered 2013-03-24: 5 mg via SUBLINGUAL
  Filled 2013-03-24: qty 0.5

## 2013-03-24 MED ORDER — HALOPERIDOL 0.5 MG PO TABS: 0.5000 mg | ORAL_TABLET | Freq: Four times a day (QID) | ORAL | Status: DC | PRN

## 2013-03-24 MED ORDER — MORPHINE SULFATE (CONCENTRATE) 10 MG /0.5 ML PO SOLN: 5.0000 mg | ORAL | Status: DC | PRN

## 2013-03-24 MED ORDER — SCOPOLAMINE 1 MG/3DAYS TD PT72: 1.0000 | MEDICATED_PATCH | TRANSDERMAL | Status: DC

## 2013-03-24 NOTE — Progress Notes (Addendum)
Patient [name]      DOB: November 05, 1914      NUU:[id number]   Palliative Medicine Team at [facility] Progress Note    Subjective: Patient alert, somewhat restless, facial gimmacing, moans out when R leg moved. Patients daughters at bedside, only taking small sip/bites.Social service into speak with family regarding disposition planning.  Informed family there is no be availability at [facility], plan to  speak with HPOA, patients brother Colin Benton about other options.    Filed Vitals:   03/24/13 0620  BP: 138/58  Pulse: 55  Temp: 98.1 F (36.7 C)  Resp: 20   Physical exam:  General: Eyes open, alert, facial grimacing, slightly restless CHEST: coarse rhonchi  CVS: RRR  ABD: BS audible  EXT: L arm edematous, trace-1+ pedal edema   Assessment and plan:  Dr Benjamine Mola discussed overall poor prognosis with HPOA, yesterday, decision made for transition to residential hospice.  1) Code Status: DNR/DNI  2) Pain/Dyspnea: Increased Roxanol SL to every 3 hours as needed 3) Terminal Secretions: Scopolamine patch ordered  4) Disposition: Awaiting residential hospice choice to be offered.   Time In Time Out Total Time Spent with Patient Total Overall Time  11:30a 11:45a 15 min 15 min    Freddie Breech, CNS-C Palliative Medicine Team [facility] Team Phone: [phone] Pager: [phone]

## 2013-03-24 NOTE — Progress Notes (Signed)
Report given to facility in preperation for transfer of patient.  Pt has been resting quietly this after noon.  Family remains at bedside.

## 2013-03-24 NOTE — Discharge Summary (Addendum)
Physician Discharge Summary  [name] ZOX:[id number] DOB: [birth year] DOA: 03/11/2013  PCP: Lenoard Aden, NP  Admit date: 03/11/2013 Discharge date: 03/24/2013  Time spent: 35 minutes  Recommendations for Outpatient Follow-up:  Hospice- Inpt  Discharge Diagnoses:  Principal Problem:   Severe sepsis with septic shock Active Problems:   Unspecified hypothyroidism   Type II or unspecified type diabetes mellitus with neurological manifestations, not stated as uncontrolled(250.60)   COPD (chronic obstructive pulmonary disease)   Anemia of chronic disease   Palliative care encounter   Pain in right extremity   Dyspnea   ARF (acute renal failure)   UTI (urinary tract infection)   Acute respiratory failure   Metabolic acidosis   Swelling of limb   Bacteremia   Hypernatremia   Increased oropharyngeal secretions   Discharge Condition: stable- PO intake is decreasing  Diet recommendation: DYS 1  Filed Weights   03/11/13 1136  Weight: 75.8 kg (167 lb 1.7 oz)    History of present illness:  79 y.o. 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  At baseline the pt is total care and not oriented. The only thing he can do is feed himself   Hospital Course:  Sepsis / septic shock  Secondary to UTI and bacteremia. Currently afebrile. BP has improved w/ volume resuscitation - change to oral Ceftin and completed a two-week course of antibiotic therapy. Discussed with Dr. Orvan Falconer of infectious diseases. Discussed wit family we would not be re-started abx for him at Hospice   Acute hypoxic resp failure  Due to septic shock - off O2.   Gram negative rod Pyelonephritis/UTI/ Proteus bacteremia  Grossly  positive UA in pt w/ indwelling foley cath -  Urine culture shows Providencia stuartii sensitive to Rocephin, D/C Zosyn.  Blood cultures positive for proteus mirabilis sensitive to rocephin  - d/c'd vancomycin (MRSA negative + gram neg rods on cx)  -see above   Acute renal failure  Slowly improving. Due to hypoperfusion +/- ATN - renal function had worsened since admit - follow trend - foley was repositioned after results of CT suggesting it was not in bladder.  Renal function has started to trend back down and essentially close to baseline.   Hypernatremia  Likely secondary to volume depletion in the setting of hypotension.  Continue free water.- patient not able to take much in-suspect he is near his end of life   aggitation  -PO haldol PRN   Metabolic Acidosis - lactic acidosis  Due to acute renal failure + sepsis - lactate has normalized. Improved with bicarb. D/C bicarb.   Chronic Afib  Rate controlled at the present time   DM  Follow CBG w/ SSI   CAD   MVR s/p annuloplasty   Dementia   Hx of HTN  Not an active problem at this time   COPD  Well compensated at present   BPH w/ chronic foley cath  CT suggested foley was not in bladder - cath was repositioned by RN   Hypothyroidism  Synthroid via IV   Overweight  BMI is 26.23 kg/(m^2)   LUE swelling  Dopplers neg for DVT.      Procedures:    Consultations:  Palliative care  Discharge Exam: Filed Vitals:   03/23/13 1400 03/23/13 2135 03/24/13 0620 03/24/13 1410  BP: 128/69 112/62 138/58 120/58  Pulse: 60 54 55 60  Temp: 98.6 F (37 C) 98.8 F (37.1 C) 98.1 F (36.7 C) 98.6 F (37 C)  TempSrc: Axillary Axillary Axillary Axillary  Resp: 18 20 20 20   Height:      Weight:      SpO2: 100% 100% 99% 98%    General: resting comfortably Cardiovascular: rrr Respiratory: coarse BS  Discharge Instructions      Discharge Orders   Future Orders Complete By Expires     Discharge instructions   As directed     Comments:      Patient will need help during feeding. Patient will need frequent turning.    Discharge instructions  As directed     Comments:      DYS 1        Medication List    STOP taking these medications       ALPRAZolam 0.25 MG tablet  Commonly known as:  XANAX     eucerin cream     gabapentin 100 MG capsule  Commonly known as:  NEURONTIN     metFORMIN 500 MG tablet  Commonly known as:  GLUCOPHAGE     senna 8.6 MG Tabs  Commonly known as:  SENOKOT     traMADol 50 MG tablet  Commonly known as:  ULTRAM      TAKE these medications       acetaminophen 500 MG tablet  Commonly known as:  TYLENOL  Take 1,000 mg by mouth every 6 (six) hours as needed for pain.     haloperidol 0.5 MG tablet  Commonly known as:  HALDOL  Take 1 tablet (0.5 mg total) by mouth every 6 (six) hours as needed.     levothyroxine 125 MCG tablet  Commonly known as:  SYNTHROID, LEVOTHROID  Take 125 mcg by mouth daily before breakfast.     morphine CONCENTRATE 10 mg / 0.5 ml concentrated solution  Place 0.25 mLs (5 mg total) under the tongue every 3 (three) hours as needed.     scopolamine 1.5 MG  Commonly known as:  TRANSDERM-SCOP  Place 1 patch (1.5 mg total) onto the skin every 3 (three) days.       Allergies  Allergen Reactions  . Sulfa Antibiotics Other (See Comments)    unknown   Follow-up Information   Follow up with Lenoard Aden, NP. Schedule an appointment as soon as possible for a visit in 1 week.   Contact information:   [address] [place] [address] [phone]        The results of significant diagnostics from this hospitalization (including imaging, microbiology, ancillary and laboratory) are listed below for reference.    Significant Diagnostic Studies: Ct Abdomen Pelvis Wo Contrast  03/11/2013   *RADIOLOGY REPORT*  Clinical Data: Abdominal pain and fever.  CT ABDOMEN AND PELVIS WITHOUT CONTRAST  Technique:  Multidetector CT  imaging of the abdomen and pelvis was performed following the standard protocol without intravenous contrast.  Comparison:  12/08/2009  Findings: Examination is limited by patient motion.  The lung bases demonstrate bibasilar atelectasis.  The heart is enlarged.  The liver is grossly normal.  The spleen is intact.  The pancreas and adrenal glands are grossly normal.  The left kidney demonstrates  simple cysts.  No hydronephrosis.  The stomach, duodenum, small bowel and colon are  grossly normal without oral contrast.  There is a large amount of stool in the rectum suggesting fecal impaction.  No mesenteric or retroperitoneal mass, adenopathy, hematoma or abscess.  The aorta demonstrates advanced atherosclerotic calcifications.  An IVC filter is in place.  There is a large amount of air in the bladder along with some layering high attenuation material which could be blood.  The Foley catheter balloon is in the prostate gland and not in the bladder. The Foley catheter needs to be repositioned.  The bony structures are unremarkable.  There is advanced osteoporosis.  No destructive bony changes.  IMPRESSION:  1.  The Foley catheter is not in the bladder.  The balloon is in the prostate gland.  This needs to be repositioned. 2.  The bladder is distended.  It contains air and probable hemorrhage. 3.  No other significant intra-abdominal/intrapelvic abnormalities but the study is quite limited without contrast and due to patient motion.   Original Report Authenticated By: Rudie Meyer, M.D.   Dg Chest Port 1 View  03/13/2013   *RADIOLOGY REPORT*  Clinical Data: Hypoxia.  PORTABLE CHEST - 1 VIEW  Comparison: PA and lateral chest 06/14/2010 single view of the chest 03/11/2013.  Findings: Pacing device again noted.  There is cardiomegaly and vascular congestion.  Increased airspace opacity in the left base is likely due to atelectasis.  No pneumothorax or pleural fluid.  IMPRESSION:  1.  Cardiomegaly and vascular  congestion. 2.  Increased left basilar airspace opacity is likely due to atelectasis.   Original Report Authenticated By: Holley Dexter, M.D.   Dg Chest Portable 1 View  03/11/2013   *RADIOLOGY REPORT*  Clinical Data: Fever.  PORTABLE CHEST - 1 VIEW  Comparison: Chest x-ray 06/14/2010.  Findings: Linear opacity at the left base favored to represent subsegmental atelectasis (underlying air space consolidation is difficult to entirely exclude).  No definite pleural effusions. Pulmonary venous congestion, without frank pulmonary edema.  Heart size appears mildly enlarged. The patient is rotated to the left on today's exam, resulting in distortion of the mediastinal contours and reduced diagnostic sensitivity and specificity for mediastinal pathology.  Atherosclerosis in the thoracic aorta.  Left-sided pacemaker device in place with lead tip projecting over the expected location of the right ventricular apex.  Status post median sternotomy.  IMPRESSION: 1.  Low lung volumes with probable subsegmental atelectasis in the left lower lobe. 2.  Mild cardiomegaly with pulmonary venous congestion. 3.  Atherosclerosis. 4.  Postoperative changes and support apparatus, as above.   Original Report Authenticated By: Trudie Reed, M.D.    Microbiology: No results found for this or any previous visit (from the past 240 hour(s)).   Labs: Basic Metabolic Panel:  Recent Labs Lab 03/18/13 0535 03/19/13 0458 03/20/13 0758 03/21/13 0535  NA 151* 149* 143 140  K 4.0 4.0 3.7 3.9  CL 120* 117* 113* 111  CO2 22 20 21 23   GLUCOSE 134* 142* 137* 133*  BUN 27* 22 19 18   CREATININE 1.31 1.18 1.14 1.13  CALCIUM 8.3* 8.1* 7.9* 7.9*   Liver Function Tests: No results found for this basename: AST, ALT, ALKPHOS, BILITOT, PROT, ALBUMIN,  in the last 168 hours No results found for this basename: LIPASE, AMYLASE,  in the last 168 hours No results found for this basename: AMMONIA,  in the last 168 hours CBC:  Recent  Labs Lab 03/20/13 0758 03/21/13 0535  WBC 8.1 8.0  HGB 9.8* 9.4*  HCT 30.9* 29.4*  MCV 94.2 92.7  PLT 170 177   Cardiac Enzymes: No results found for this basename: CKTOTAL, CKMB, CKMBINDEX, TROPONINI,  in the last 168 hours BNP: BNP (last 3 results) No results found for this basename: PROBNP,  in the last 8760 hours CBG: No results found for this basename: GLUCAP,  in the last 168 hours     Signed:  Marlin Canary  [facility] 03/24/2013, 3:28 PM

## 2013-04-16 ENCOUNTER — Non-Acute Institutional Stay (SKILLED_NURSING_FACILITY): Payer: Medicare Other | Admitting: Internal Medicine

## 2013-04-16 ENCOUNTER — Encounter: Payer: Self-pay | Admitting: Internal Medicine

## 2013-04-16 DIAGNOSIS — E039 Hypothyroidism, unspecified: Secondary | ICD-10-CM

## 2013-04-16 DIAGNOSIS — K5900 Constipation, unspecified: Secondary | ICD-10-CM

## 2013-04-16 DIAGNOSIS — M7989 Other specified soft tissue disorders: Secondary | ICD-10-CM

## 2013-04-16 DIAGNOSIS — I639 Cerebral infarction, unspecified: Secondary | ICD-10-CM

## 2013-04-16 DIAGNOSIS — J449 Chronic obstructive pulmonary disease, unspecified: Secondary | ICD-10-CM

## 2013-04-16 DIAGNOSIS — I6350 Cerebral infarction due to unspecified occlusion or stenosis of unspecified cerebral artery: Secondary | ICD-10-CM

## 2013-04-16 DIAGNOSIS — K1370 Unspecified lesions of oral mucosa: Secondary | ICD-10-CM

## 2013-04-16 DIAGNOSIS — G609 Hereditary and idiopathic neuropathy, unspecified: Secondary | ICD-10-CM

## 2013-04-16 DIAGNOSIS — E1149 Type 2 diabetes mellitus with other diabetic neurological complication: Secondary | ICD-10-CM

## 2013-04-16 DIAGNOSIS — K117 Disturbances of salivary secretion: Secondary | ICD-10-CM

## 2013-04-16 NOTE — Assessment & Plan Note (Signed)
It was during this hosp that brother decided on hospice and comfort care

## 2013-04-16 NOTE — Assessment & Plan Note (Signed)
On multiple prn meds

## 2013-04-16 NOTE — Assessment & Plan Note (Signed)
Was on Metformin;d/c due to hospice condition

## 2013-04-16 NOTE — Assessment & Plan Note (Signed)
Was once on Neurontin-d/c sec to hospice condition

## 2013-04-16 NOTE — Progress Notes (Signed)
MRN: [id number] Name: [name]  Sex: male Age: 79 y.o. DOB: 06/17/1915  PSC #: [name] Facility/Room:  316 Level Of Care: SNF Provider: Merrilee Seashore D Emergency Contacts: Extended Emergency Contact Information Primary Emergency Contact: Kilmartin,Earl Address: [address]          [place], [place] [address] [place] of [place] Home Phone: [phone] Relation: Brother Secondary Emergency Contact: Ladon Applebaum [place] of [place] Home Phone: [phone] Mobile Phone: [phone] Relation: Daughter  Code Status: DNR/hospice  Allergies: Sulfa antibiotics  Chief Complaint  Patient presents with  . nursing home admission    HPI: Patient is 79 y.o. male who had sepsis ans stroke with mild L side weakness 02/2013 and was made a hospice pt at that time by MD and brother and medication were stopped.  Past Medical History  Diagnosis Date  . Unspecified hypothyroidism   . Unspecified hereditary and idiopathic peripheral neuropathy   . Chronic airway obstruction, not elsewhere classified   . Unspecified constipation   . Rash and other nonspecific skin eruption   . Pacemaker   . Hypertension   . Heart murmur   . CHF (congestive heart failure)   . History of duodenal ulcer     [name] 09/06/2009  (03/11/2013)  . Renal insufficiency     [name] 09/06/2009  (03/11/2013)  . Chronic atrial fibrillation     [name] 09/06/2009  (03/11/2013)  . Dysphagia     [name] 09/06/2009  (03/11/2013)  . Phlebitis     "I think he's had it to both legs" (03/11/2013)  . DVT (deep venous thrombosis)     LLE/notes 05/25/2010 (03/11/2013(  . Pneumonia     "a few times"/daughter (03/11/2013)  . Exertional dyspnea   . Type II or unspecified type diabetes mellitus with neurological manifestations, not stated as uncontrolled(250.60)     [name] 05/25/2010 (03/11/2013(  . Dementia     severe/notes 05/25/2010 (03/11/2013(  . Arthritis   . Chronic upper back pain   . Depression     "at times;  maybe related to the dementia" (03/11/2013)  . UTI (urinary tract infection)     "several at [facility]" (03/11/2013)  . Basal cell carcinoma of ear     left/notes 06/22/2008 (03/11/2013)  . Colon cancer   . Hard of hearing     "both ears" (03/11/2013)  . Stroke     "made him weaker on his left side" (03/11/2013)    Past Surgical History  Procedure Laterality Date  . Hernia repair    . Coronary artery bypass graft    . Mitral valve annuloplasty  05/2003    [name] 06/22/2008 (03/11/2013)  . Tonsillectomy      [name] 06/22/2008 (03/11/2013)  . Insert / replace / remove pacemaker      /notes 06/22/2008 (03/11/2013)  . Cataract extraction w/ intraocular lens  implant, bilateral      [name] 06/22/2008 (03/11/2013)  . Colectomy  1995; 1996    partial/notes 06/22/2008 (03/11/2013)  . Basal cell carcinoma excision Left     ear/notes 06/22/2008 (03/11/2013)      Medication List       This list is accurate as of: 04/16/13 10:11 AM.  Always use your most recent med list.               acetaminophen 500 MG tablet  Commonly known as:  TYLENOL  Take 1,000 mg by mouth every 4 (four) hours as needed for pain.     diphenhydrAMINE 25 MG  tablet  Commonly known as:  SOMINEX  Take 25 mg by mouth every 6 (six) hours as needed for sleep.     ipratropium-albuterol 0.5-2.5 (3) MG/3ML Soln  Commonly known as:  DUONEB  Take 3 mL by nebulization every 6 (six) hours as needed.        Meds ordered this encounter  Medications  . ipratropium-albuterol (DUONEB) 0.5-2.5 (3) MG/3ML SOLN    Sig: Take 3 mL by nebulization every 6 (six) hours as needed.  . diphenhydrAMINE (SOMINEX) 25 MG tablet    Sig: Take 25 mg by mouth every 6 (six) hours as needed for sleep.     There is no immunization history on file for this patient.  History  Substance Use Topics  . Smoking status: Never Smoker   . Smokeless tobacco: Never Used  . Alcohol Use: No    Family history is noncontributory    Review of  Systems  DATA OBTAINED: from patient some and nurse; pt is minimally verbal but responds to questions;report of LUE edema onset yesterday?; pt denies SOB or pain    Filed Vitals:   04/16/13 0946  BP: 150/71  Pulse: 60  Temp: 98.7 F (37.1 C)  Resp: 24    Physical Exam  GENERAL APPEARANCE: Alert, No acute distress.  SKIN: No diaphoresis rash, or wounds HEENT- unremarkable except pt is very hard of hearing RESPIRATORY: mild abd breathing; diffusely dec BS and intermiddent rhonchi CARDIOVASCULAR: Heart RRR no murmurs, rubs or gallops. No LE peripheral edema. LUE- large edema from mid forearm to mid arm;not red, not hot ND VEINS IN UPPER ARM AND L CHEST ATE VERY PROMINENT ARTERIAL: radial pulse 2+, DP pulse 1+  VENOUS: No varicosities. No venous stasis skin changes  GASTROINTESTINAL: Abdomen is soft, non-tender, not distended w/ normal bowel sounds. GENITOURINARY: Bladder non tender, not distended  MUSCULOSKELETAL: No abnormal joints or musculature NEUROLOGIC:  Cranial nerves 2-12 grossly intact. Moves all extremities; KL side weakness of face noted but of arms is not PSYCHIATRIC: Mood and affect are usual for him  Patient Active Problem List   Diagnosis Date Noted  . Stroke   . Increased oropharyngeal secretions 03/21/2013  . Bacteremia 03/17/2013  . Hypernatremia 03/17/2013  . Swelling of limb 03/16/2013  . Severe sepsis with septic shock 03/14/2013  . ARF (acute renal failure) 03/14/2013  . UTI (urinary tract infection) 03/14/2013  . Acute respiratory failure 03/14/2013  . Metabolic acidosis 03/14/2013  . Palliative care encounter 03/12/2013  . Pain in right extremity 03/12/2013  . Dyspnea 03/12/2013  . Anemia of chronic disease 01/15/2013  . Unspecified hypothyroidism   . Type II or unspecified type diabetes mellitus with neurological manifestations, not stated as uncontrolled(250.60)   . Unspecified hereditary and idiopathic peripheral neuropathy   . COPD (chronic  obstructive pulmonary disease)   . Unspecified constipation   . Rash and other nonspecific skin eruption      CBC    Component Value Date/Time   WBC 8.0 03/21/2013 0535   RBC 3.17* 03/21/2013 0535   RBC 4.05* 12/09/2009 1139   HGB 9.4* 03/21/2013 0535   HCT 29.4* 03/21/2013 0535   PLT 177 03/21/2013 0535   MCV 92.7 03/21/2013 0535   LYMPHSABS 0.2* 03/11/2013 1045   MONOABS 0.0* 03/11/2013 1045   EOSABS 0.0 03/11/2013 1045   BASOSABS 0.0 03/11/2013 1045    CMP     Component Value Date/Time   NA 140 03/21/2013 0535   K 3.9 03/21/2013 0535  CL 111 03/21/2013 0535   CO2 23 03/21/2013 0535   GLUCOSE 133* 03/21/2013 0535   BUN 18 03/21/2013 0535   CREATININE 1.13 03/21/2013 0535   CALCIUM 7.9* 03/21/2013 0535   PROT 5.3* 03/12/2013 0135   ALBUMIN 2.2* 03/12/2013 0135   AST 22 03/12/2013 0135   ALT 8 03/12/2013 0135   ALKPHOS 60 03/12/2013 0135   BILITOT 0.4 03/12/2013 0135   GFRNONAA 53* 03/21/2013 0535   GFRAA 61* 03/21/2013 0535    Assessment and Plan  COPD (chronic obstructive pulmonary disease) Stable-pt on 3L [place] and has prn nebs  Stroke It was during this hosp that brother decided on hospice and comfort care  Unspecified hereditary and idiopathic peripheral neuropathy Was once on Neurontin-d/c sec to hospice condition  Type II or unspecified type diabetes mellitus with neurological manifestations, not stated as uncontrolled(250.60) Was on Metformin;d/c due to hospice condition  Unspecified hypothyroidism On no meds  Unspecified constipation On multiple prn meds  Increased oropharyngeal secretions Was on scopolamine-now d/c sec to hospice decision  Swelling of limb This time pt has swelling L arm; veins in arm very prominent-have ordered LUE [place] r/o DVT  Pt is hospice but honestly does not look like he is going to die soon;hospice is here;will follow  Margit Hanks, MD

## 2013-04-16 NOTE — Assessment & Plan Note (Signed)
Stable-pt on 3L [place] and has prn nebs

## 2013-04-16 NOTE — Assessment & Plan Note (Signed)
On no meds 

## 2013-04-16 NOTE — Assessment & Plan Note (Signed)
This time pt has swelling L arm; veins in arm very prominent-have ordered LUE [place] r/o DVT

## 2013-04-16 NOTE — Assessment & Plan Note (Signed)
Was on scopolamine-now d/c sec to hospice decision

## 2013-04-23 ENCOUNTER — Non-Acute Institutional Stay (SKILLED_NURSING_FACILITY): Payer: Medicare Other | Admitting: Nurse Practitioner

## 2013-04-23 ENCOUNTER — Encounter: Payer: Self-pay | Admitting: Nurse Practitioner

## 2013-04-23 DIAGNOSIS — G589 Mononeuropathy, unspecified: Secondary | ICD-10-CM

## 2013-04-23 DIAGNOSIS — J449 Chronic obstructive pulmonary disease, unspecified: Secondary | ICD-10-CM

## 2013-04-23 DIAGNOSIS — E119 Type 2 diabetes mellitus without complications: Secondary | ICD-10-CM

## 2013-04-23 DIAGNOSIS — E349 Endocrine disorder, unspecified: Secondary | ICD-10-CM

## 2013-04-23 NOTE — Progress Notes (Signed)
Patient ID: [name], male   DOB: Aug 05, 1915, 79 y.o.   MRN: [id number]  Nursing Home Location:  [facility]   Place of Service: SNF (31)  Chief Complaint  Patient presents with  . Acute Visit    HPI:  79 year old male who is a LTR of [facility] now under hospice services is being seen today for better pain management. hospice nurse reports pt reported pain in legs and rapid shallow breathing. Pt with PMH of COPD, DM, peripheral neuropathy and was previously on medications for neuropathy due to increase in pain. After review of medication pt is not on any scheduled or PRN pain medication.   Review of Systems:    DATA OBTAINED: from nurse and medical record-- pt unable to provide much history or ROS GENERAL:  no fevers, fatigue, appetite changes MOUTH/THROAT: no difficulty chewing or swallowing  RESPIRATORY: has SOB CARDIAC: No chest pain, palpitations, no lower extremity edema  GI: No abdominal pain, No N/V/D or constipation, No heartburn or reflux  GU: chronic foley catheter MUSCULOSKELETAL: leg pain NEUROLOGIC: Awake, alert, No change in mental status. PSYCHIATRIC: No overt anxiety or sadness. Sleeps well. No behavior issue.     Medications: Patient's Medications  New Prescriptions   No medications on file  Previous Medications   ACETAMINOPHEN (TYLENOL) 500 MG TABLET    Take 1,000 mg by mouth every 4 (four) hours as needed for pain.    DIPHENHYDRAMINE (SOMINEX) 25 MG TABLET    Take 25 mg by mouth every 6 (six) hours as needed for sleep.   IPRATROPIUM-ALBUTEROL (DUONEB) 0.5-2.5 (3) MG/3ML SOLN    Take 3 mL by nebulization every 6 (six) hours as needed.  Modified Medications   No medications on file  Discontinued Medications   No medications on file     Physical Exam:  Filed Vitals:   04/23/13 1148  BP: 156/66  Pulse: 50  Temp: 97.9 F (36.6 C)  Resp: 24    GENERAL APPEARANCE: Alert, in No acute distress.  SKIN: No diaphoresis rash, or  wounds HEAD: Normocephalic, atraumatic  EYES: Conjunctiva/lids clear. Pupils round, reactive. EOMs intact.  EARS: External exam WNL. Very HOH NOSE: No deformity or discharge.  MOUTH/THROAT: Lips w/o lesions. Mouth and throat normal. Tongue moist, w/o lesion.  NECK: No thyroid tenderness, enlargement or nodule  RESPIRATORY: Breathing is even, shallow and frequent however does not appear labored. Lung sounds are clear   CARDIOVASCULAR: Heart RRR no murmurs, rubs or gallops. No peripheral edema.  ARTERIAL: radial pulse 2+, DP pulse 1+  VENOUS: No varicosities. No venous stasis skin changes  GASTROINTESTINAL: Abdomen is soft, non-tender, not distended w/ normal bowel sounds. GENITOURINARY: Bladder non tender, not distended  MUSCULOSKELETAL: No abnormal joints or musculature  Assessment/Plan COPD Remains on 3L; will add roxanol 0.25 ml (5mg ) q 4 hours as needed for pain, shortness of breath, or discomfort  DM Not currently on any medications  Neuropathy Will add gabapentin 100 mg TID back to medications due to increased pain in lower extremities.

## 2013-04-30 ENCOUNTER — Non-Acute Institutional Stay (SKILLED_NURSING_FACILITY): Payer: Medicare Other | Admitting: Nurse Practitioner

## 2013-04-30 DIAGNOSIS — L299 Pruritus, unspecified: Secondary | ICD-10-CM

## 2013-04-30 DIAGNOSIS — K5900 Constipation, unspecified: Secondary | ICD-10-CM

## 2013-04-30 NOTE — Progress Notes (Signed)
Patient ID: ELRAY DAINS, male   DOB: 03/23/1915, 79 y.o.   MRN: [id number]  Nursing Home Location:  [facility]   Place of Service: SNF (31)  Chief Complaint  Patient presents with  . Acute Visit    HPI:  79 year old male who is a LTR of [facility] now under hospice services is being seen today due to increase in itching despite being on benadryl causing excoriation of skin and constipation.  Pt with PMH of COPD, DM, peripheral neuropathy and was previously on medications for constipation and itching but since he has been on hospice this has been stopped.  Staff reports increase in abdominal distension with infrequent bMs Review of Systems:    Unable to obtain  Medications: Patient's Medications  New Prescriptions   No medications on file  Previous Medications   ACETAMINOPHEN (TYLENOL) 500 MG TABLET    Take 1,000 mg by mouth every 4 (four) hours as needed for pain.    DIPHENHYDRAMINE (SOMINEX) 25 MG TABLET    Take 25 mg by mouth every 6 (six) hours as needed for sleep.   GABAPENTIN (NEURONTIN) 100 MG CAPSULE    Take 100 mg by mouth 3 (three) times daily.   IPRATROPIUM-ALBUTEROL (DUONEB) 0.5-2.5 (3) MG/3ML SOLN    Take 3 mL by nebulization every 6 (six) hours as needed.   MORPHINE (ROXANOL) 20 MG/ML CONCENTRATED SOLUTION    Take 5 mg by mouth every 4 (four) hours as needed for pain.  Modified Medications   No medications on file  Discontinued Medications   No medications on file     Physical Exam:  Filed Vitals:   04/30/13 1141  BP: 125/61  Pulse: 54  Temp: 98 F (36.7 C)  Resp: 20     GENERAL APPEARANCE: Appropriately groomed. No acute distress.  SKIN: open areas to bilateral knees; without signs of infection RESPIRATORY: Breathing is even, unlabored. Lung sounds are clear   CARDIOVASCULAR: Heart RRR no murmurs, rubs or gallops. No peripheral edema.  ARTERIAL: radial pulse 2+ GASTROINTESTINAL: Abdomen is soft, non-tender, not distended w/ normal  bowel sounds. GENITOURINARY: Bladder non tender, not distended  MUSCULOSKELETAL: No abnormal joints or musculature  Assessment/Plan 1. Pruritus This has been ongoing since before previous hospitalization Vistaril worked previously- will dc benedryl  And start vistaril 50 mg q 8 hours for 2 weeks then PRN  2. Constipation Will start colace 100mg  liquid twice daily for constipation

## 2013-05-07 ENCOUNTER — Non-Acute Institutional Stay (SKILLED_NURSING_FACILITY): Payer: Medicare Other | Admitting: Nurse Practitioner

## 2013-05-07 DIAGNOSIS — R21 Rash and other nonspecific skin eruption: Secondary | ICD-10-CM

## 2013-05-07 DIAGNOSIS — K5900 Constipation, unspecified: Secondary | ICD-10-CM

## 2013-05-07 DIAGNOSIS — J449 Chronic obstructive pulmonary disease, unspecified: Secondary | ICD-10-CM

## 2013-05-07 NOTE — Progress Notes (Signed)
Patient ID: AZEEM POORMAN, male   DOB: [birth year], 79 y.o.   MRN: [id number]  Nursing Home Location:  [facility]   Place of Service: SNF (31)  Chief Complaint  Patient presents with  . Medical Managment of Chronic Issues    HPI:  79 year old male with PMH of advanced dementia, COPD, DM, peripheral neuropathy, chronic foley catheter due to obstructive BPH who is a LTR of [facility] now under hospice services is being seen today for routine follow up.  Pt was seen in the past month due to pain management, constipation, itching, and open area on his legs.  Nursing reports pain has been well managed with PRN roxanol, constipation improved with colace , itching and open areas on legs have decreased with added vistaril   Nursing without concerns today.  Review of Systems:  Unable to preform.  Medications: Patient's Medications  New Prescriptions   No medications on file  Previous Medications   ACETAMINOPHEN (TYLENOL) 500 MG TABLET    Take 1,000 mg by mouth every 4 (four) hours as needed for pain.    DIPHENHYDRAMINE (SOMINEX) 25 MG TABLET    Take 25 mg by mouth every 6 (six) hours as needed for sleep.   DOCUSATE SODIUM (COLACE) 100 MG CAPSULE    Take 100 mg by mouth 2 (two) times daily.   GABAPENTIN (NEURONTIN) 100 MG CAPSULE    Take 100 mg by mouth 3 (three) times daily.   HYDROXYZINE (ATARAX/VISTARIL) 50 MG TABLET    Take 50 mg by mouth 3 (three) times daily as needed for itching.   IPRATROPIUM-ALBUTEROL (DUONEB) 0.5-2.5 (3) MG/3ML SOLN    Take 3 mL by nebulization every 6 (six) hours as needed.   MORPHINE (ROXANOL) 20 MG/ML CONCENTRATED SOLUTION    Take 5 mg by mouth every 4 (four) hours as needed for pain.  Modified Medications   No medications on file  Discontinued Medications   No medications on file     Physical Exam:  Filed Vitals:   05/07/13 1207  BP: 122/81  Pulse: 80  Temp: 97.7 F (36.5 C)  Resp: 20  SpO2: 100%    GENERAL APPEARANCE: No acute  distress.  SKIN: No diaphoresis or wounds, open areas on bilateral knees are covered and followed by treatment nurse RESPIRATORY: Breathing is even, unlabored. Lung sounds are clear   CARDIOVASCULAR: Heart RRR no murmurs, rubs or gallops. No peripheral edema.  ARTERIAL: radial pulse 2+, DP pulse 1+  GASTROINTESTINAL: Abdomen is soft, non-tender, not distended w/ normal bowel sounds. GENITOURINARY: Bladder non tender, not distended  MUSCULOSKELETAL: No abnormal joints or musculature NEUROLOGIC: Oriented to self, Moves all extremities no tremor. PSYCHIATRIC: Mood and affect appropriate to situation, no behavioral issues    Assessment/Plan 1. COPD (chronic obstructive pulmonary disease) Patient is stable; continue current regimen. No worsening shortness of breath or discomforted noted Will monitor and make changes as necessary.  2. Unspecified constipation Improved with addition of medications  3. Rash and other nonspecific skin eruption Itching has improved with scheduled vistaril; will cont for 1 more week then make PRN

## 2013-05-14 ENCOUNTER — Non-Acute Institutional Stay (SKILLED_NURSING_FACILITY): Payer: Medicare Other | Admitting: Internal Medicine

## 2013-05-14 ENCOUNTER — Encounter: Payer: Self-pay | Admitting: Internal Medicine

## 2013-05-14 DIAGNOSIS — R112 Nausea with vomiting, unspecified: Secondary | ICD-10-CM

## 2013-05-14 DIAGNOSIS — R310 Gross hematuria: Secondary | ICD-10-CM

## 2013-05-14 NOTE — Progress Notes (Signed)
MRN: [id number] Name: [name]  Sex: male Age: 79 y.o. DOB: 08/18/1915  PSC #: [name] Facility/Room: 318 Level Of Care: SNF Provider: Merrilee Seashore D Emergency Contacts: Extended Emergency Contact Information Primary Emergency Contact: Wagoner,Earl Address: [address]          [place], [place] [address] [place] of [place] Home Phone: [phone] Relation: Brother Secondary Emergency Contact: Ladon Applebaum [place] of [place] Home Phone: [phone] Mobile Phone: [phone] Relation: Daughter  Code Status: DNR  Allergies: Sulfa antibiotics  Chief Complaint  Patient presents with  . Acute Visit    HPI: Patient is 79 y.o. male who started vomiting and c/o back pain. The nurse said this was fairly quick in onset and that pt seemed tender in L kidney area. She also noted he had blood in his foley. Last hospitalization pt had what sounds like pyelonephritis with sepsis by daughters history that he barely survived. Nurse reported they gave him some of his PRN morphine for pain.  Past Medical History  Diagnosis Date  . Unspecified hypothyroidism   . Unspecified hereditary and idiopathic peripheral neuropathy   . Chronic airway obstruction, not elsewhere classified   . Unspecified constipation   . Rash and other nonspecific skin eruption   . Pacemaker   . Hypertension   . Heart murmur   . CHF (congestive heart failure)   . History of duodenal ulcer     [name] 09/06/2009  (03/11/2013)  . Renal insufficiency     [name] 09/06/2009  (03/11/2013)  . Chronic atrial fibrillation     [name] 09/06/2009  (03/11/2013)  . Dysphagia     [name] 09/06/2009  (03/11/2013)  . Phlebitis     "I think he's had it to both legs" (03/11/2013)  . DVT (deep venous thrombosis)     LLE/notes 05/25/2010 (03/11/2013(  . Pneumonia     "a few times"/daughter (03/11/2013)  . Exertional dyspnea   . Type II or unspecified type diabetes mellitus with neurological manifestations, not  stated as uncontrolled(250.60)     [name] 05/25/2010 (03/11/2013(  . Dementia     severe/notes 05/25/2010 (03/11/2013(  . Arthritis   . Chronic upper back pain   . Depression     "at times; maybe related to the dementia" (03/11/2013)  . UTI (urinary tract infection)     "several at [facility]" (03/11/2013)  . Basal cell carcinoma of ear     left/notes 06/22/2008 (03/11/2013)  . Colon cancer   . Hard of hearing     "both ears" (03/11/2013)  . Stroke     "made him weaker on his left side" (03/11/2013)    Past Surgical History  Procedure Laterality Date  . Hernia repair    . Coronary artery bypass graft    . Mitral valve annuloplasty  05/2003    [name] 06/22/2008 (03/11/2013)  . Tonsillectomy      [name] 06/22/2008 (03/11/2013)  . Insert / replace / remove pacemaker      /notes 06/22/2008 (03/11/2013)  . Cataract extraction w/ intraocular lens  implant, bilateral      [name] 06/22/2008 (03/11/2013)  . Colectomy  1995; 1996    partial/notes 06/22/2008 (03/11/2013)  . Basal cell carcinoma excision Left     ear/notes 06/22/2008 (03/11/2013)      Medication List       This list is accurate as of: 05/14/13  2:00 PM.  Always use your most recent med list.  acetaminophen 500 MG tablet  Commonly known as:  TYLENOL  Take 1,000 mg by mouth every 4 (four) hours as needed for pain.     diphenhydrAMINE 25 MG tablet  Commonly known as:  SOMINEX  Take 25 mg by mouth every 6 (six) hours as needed for sleep.     docusate sodium 100 MG capsule  Commonly known as:  COLACE  Take 100 mg by mouth 2 (two) times daily.     gabapentin 100 MG capsule  Commonly known as:  NEURONTIN  Take 100 mg by mouth 3 (three) times daily.     hydrOXYzine 50 MG tablet  Commonly known as:  ATARAX/VISTARIL  Take 50 mg by mouth 3 (three) times daily as needed for itching.     ipratropium-albuterol 0.5-2.5 (3) MG/3ML Soln  Commonly known as:  DUONEB  Take 3 mL by nebulization every 6 (six) hours as  needed.     morphine 20 MG/ML concentrated solution  Commonly known as:  ROXANOL  Take 5 mg by mouth every 4 (four) hours as needed for pain.      ALSO PT IS ON 4L O2 continuously via [place]      History  Substance Use Topics  . Smoking status: Never Smoker   . Smokeless tobacco: Never Used  . Alcohol Use: No    Family history is noncontributory    Review of Systems  DATA OBTAINED: from patient, nurse, daughter- as per HPI  Filed Vitals:   05/14/13 1359  BP: 193/95  Pulse: 72  Resp: 20    Physical Exam  GENERAL APPEARANCE: subdued, looks uncomfortable  SKIN: No diaphoresis  HEENT- unremarkable  RESPIRATORY: Breathing is even, unlabored: Lung sounds are coarse   CARDIOVASCULAR: Heart regulkar with intermiddent irreg beat, 2/6 murmur,no rubs or gallops. No peripheral edema.   GASTROINTESTINAL: Abdomen is soft, non-tender, not distended w/ normal bowel sounds. No CVA tender GENITOURINARY: Bladder non tender, not distended  MUSCULOSKELETAL: No abnormal joints or musculature NEUROLOGIC: no new changes  Patient Active Problem List   Diagnosis Date Noted  . Stroke   . Increased oropharyngeal secretions 03/21/2013  . Bacteremia 03/17/2013  . Hypernatremia 03/17/2013  . Swelling of limb 03/16/2013  . Severe sepsis with septic shock 03/14/2013  . ARF (acute renal failure) 03/14/2013  . UTI (urinary tract infection) 03/14/2013  . Acute respiratory failure 03/14/2013  . Metabolic acidosis 03/14/2013  . Palliative care encounter 03/12/2013  . Pain in right extremity 03/12/2013  . Dyspnea 03/12/2013  . Anemia of chronic disease 01/15/2013  . Unspecified hypothyroidism   . Type II or unspecified type diabetes mellitus with neurological manifestations, not stated as uncontrolled(250.60)   . Unspecified hereditary and idiopathic peripheral neuropathy   . COPD (chronic obstructive pulmonary disease)   . Unspecified constipation   . Rash and other nonspecific skin eruption       CBC    Component Value Date/Time   WBC 8.0 03/21/2013 0535   RBC 3.17* 03/21/2013 0535   RBC 4.05* 12/09/2009 1139   HGB 9.4* 03/21/2013 0535   HCT 29.4* 03/21/2013 0535   PLT 177 03/21/2013 0535   MCV 92.7 03/21/2013 0535   LYMPHSABS 0.2* 03/11/2013 1045   MONOABS 0.0* 03/11/2013 1045   EOSABS 0.0 03/11/2013 1045   BASOSABS 0.0 03/11/2013 1045    CMP     Component Value Date/Time   NA 140 03/21/2013 0535   K 3.9 03/21/2013 0535   CL 111 03/21/2013 0535   CO2 23  03/21/2013 0535   GLUCOSE 133* 03/21/2013 0535   BUN 18 03/21/2013 0535   CREATININE 1.13 03/21/2013 0535   CALCIUM 7.9* 03/21/2013 0535   PROT 5.3* 03/12/2013 0135   ALBUMIN 2.2* 03/12/2013 0135   AST 22 03/12/2013 0135   ALT 8 03/12/2013 0135   ALKPHOS 60 03/12/2013 0135   BILITOT 0.4 03/12/2013 0135   GFRNONAA 53* 03/21/2013 0535   GFRAA 61* 03/21/2013 0535    Assessment and Plan:  Gross hematuria and vomiting -  UTI vs [place] vs CA - if it is treatable it would be nice to know and if not we can make different plans-daughter is here and aware-will send to hospital routine  Margit Hanks, MD

## 2013-05-15 ENCOUNTER — Non-Acute Institutional Stay (SKILLED_NURSING_FACILITY): Payer: Medicare Other | Admitting: Nurse Practitioner

## 2013-05-15 DIAGNOSIS — R310 Gross hematuria: Secondary | ICD-10-CM

## 2013-05-15 DIAGNOSIS — J449 Chronic obstructive pulmonary disease, unspecified: Secondary | ICD-10-CM

## 2013-05-16 ENCOUNTER — Other Ambulatory Visit: Payer: Self-pay | Admitting: *Deleted

## 2013-05-16 MED ORDER — MORPHINE SULFATE (CONCENTRATE) 20 MG/ML PO SOLN: ORAL | Status: DC

## 2013-05-16 NOTE — Progress Notes (Signed)
Patient ID: [name], male   DOB: September 23, 1914, 79 y.o.   MRN: [id number]  Nursing Home Location:  [facility]   Place of Service: SNF (31)  Chief Complaint  Patient presents with  . Acute Visit    HPI:  79 year old under hospice care is being seen at the request of nursing. Pt with increased temps over past 48 hours; given tylenol which helps bring temp down. Pt had foley change 2 days ago which resulted in trama and blood in urine;  urine was sent out for UA C&S and blood in urine has improved  Pt with some increase in RR; staff reports he is not receiving his morphine   Review of Systems:  Unable to obtain due to dementia   Medications: Patient's Medications  New Prescriptions   No medications on file  Previous Medications   ACETAMINOPHEN (TYLENOL) 500 MG TABLET    Take 1,000 mg by mouth every 4 (four) hours as needed for pain.    DIPHENHYDRAMINE (SOMINEX) 25 MG TABLET    Take 25 mg by mouth every 6 (six) hours as needed for sleep.   DOCUSATE SODIUM (COLACE) 100 MG CAPSULE    Take 100 mg by mouth 2 (two) times daily.   GABAPENTIN (NEURONTIN) 100 MG CAPSULE    Take 100 mg by mouth 3 (three) times daily.   HYDROXYZINE (ATARAX/VISTARIL) 50 MG TABLET    Take 50 mg by mouth 3 (three) times daily as needed for itching.   IPRATROPIUM-ALBUTEROL (DUONEB) 0.5-2.5 (3) MG/3ML SOLN    Take 3 mL by nebulization every 6 (six) hours as needed.   MORPHINE (ROXANOL) 20 MG/ML CONCENTRATED SOLUTION    Take 5 mg by mouth every 4 (four) hours as needed for pain.  Modified Medications   No medications on file  Discontinued Medications   No medications on file     Physical Exam:  Filed Vitals:   05/15/13 1103  BP: 148/73  Pulse: 78  Temp: 99.2 F (37.3 C)  Resp: 20  SpO2: 90%    GENERAL APPEARANCE: Appropriately groomed. No acute distress.  SKIN: open areas to bilateral knees have improved; without signs of infection  HEENT: unremarkable  RESPIRATORY: Breathing is  even, unlabored but rapid. Lung sounds are diminished.  CARDIOVASCULAR: Heart RRR no murmurs, rubs or gallops. No peripheral edema.  ARTERIAL: radial pulse 2+  GASTROINTESTINAL: Abdomen is soft, non-tender, not distended w/ normal bowel sounds.  GENITOURINARY: Bladder non tender, not distended; urine clear and yellow MUSCULOSKELETAL: No abnormal joints or musculature   Assessment/Plan 1. Gross hematuria Improved; will await results of UA C&S  2. COPD (chronic obstructive pulmonary disease)  With some increase in shortness of breath; will have staff monitor and give PRN morphine; may need to be schedule if this continues; hospice RN aware and will monitor

## 2013-05-19 ENCOUNTER — Non-Acute Institutional Stay (SKILLED_NURSING_FACILITY): Payer: Medicare Other | Admitting: Internal Medicine

## 2013-05-19 ENCOUNTER — Encounter: Payer: Self-pay | Admitting: Internal Medicine

## 2013-05-19 DIAGNOSIS — R4589 Other symptoms and signs involving emotional state: Secondary | ICD-10-CM

## 2013-05-19 DIAGNOSIS — R451 Restlessness and agitation: Secondary | ICD-10-CM

## 2013-05-19 DIAGNOSIS — IMO0002 Reserved for concepts with insufficient information to code with codable children: Secondary | ICD-10-CM

## 2013-05-19 DIAGNOSIS — R109 Unspecified abdominal pain: Secondary | ICD-10-CM

## 2013-05-19 NOTE — Progress Notes (Signed)
MRN: [id number] Name: [name]  Sex: male Age: 79 y.o. DOB: [birth year]  PSC #: [name] Facility/Room: 318 Level Of Care: SNF Provider: Merrilee Seashore Wyatt Emergency Contacts: Extended Emergency Contact Information Primary Emergency Contact: Wyatt,Tony Address: [address]          [place], [place] [address] D[place] of [place] Home Phone: [phone] Relation: Brother Secondary Emergency Contact: [name] [place] of [place] Home Phone: [phone] Mobile Phone: [phone] Relation: Daughter  Code Status: DNR  Allergies: Sulfa antibiotics  Chief Complaint  Patient presents with  . Acute Visit    HPI: Patient is 79 y.o. male who the nurse asked me to see because he has had agitation and has nothing for it and also because he needs a different pain medication.Morphine makes him act crazy and did the same thing when he was on it in the hospital.  Past Medical History  Diagnosis Date  . Unspecified hypothyroidism   . Unspecified hereditary and idiopathic peripheral neuropathy   . Chronic airway obstruction, not elsewhere classified   . Unspecified constipation   . Rash and other nonspecific skin eruption   . Pacemaker   . Hypertension   . Heart murmur   . CHF (congestive heart failure)   . History of duodenal ulcer     [name] 09/06/2009  (03/11/2013)  . Renal insufficiency     [name] 09/06/2009  (03/11/2013)  . Chronic atrial fibrillation     [name] 09/06/2009  (03/11/2013)  . Dysphagia     [name] 09/06/2009  (03/11/2013)  . Phlebitis     "I think he's had it to both legs" (03/11/2013)  . DVT (deep venous thrombosis)     LLE/notes 05/25/2010 (03/11/2013(  . Pneumonia     "a few times"/daughter (03/11/2013)  . Exertional dyspnea   . Type II or unspecified type diabetes mellitus with neurological manifestations, not stated as uncontrolled(250.60)     [name] 05/25/2010 (03/11/2013(  . Dementia     severe/notes 05/25/2010 (03/11/2013(  . Arthritis    . Chronic upper back pain   . Depression     "at times; maybe related to the dementia" (03/11/2013)  . UTI (urinary tract infection)     "several at [name]" (03/11/2013)  . Basal cell carcinoma of ear     left/notes 06/22/2008 (03/11/2013)  . Colon cancer   . Hard of hearing     "both ears" (03/11/2013)  . Stroke     "made him weaker on his left side" (03/11/2013)    Past Surgical History  Procedure Laterality Date  . Hernia repair    . Coronary artery bypass graft    . Mitral valve annuloplasty  05/2003    [name] 06/22/2008 (03/11/2013)  . Tonsillectomy      [name] 06/22/2008 (03/11/2013)  . Insert / replace / remove pacemaker      /notes 06/22/2008 (03/11/2013)  . Cataract extraction w/ intraocular lens  implant, bilateral      [name] 06/22/2008 (03/11/2013)  . Colectomy  1995; 1996    partial/notes 06/22/2008 (03/11/2013)  . Basal cell carcinoma excision Left     ear/notes 06/22/2008 (03/11/2013)      Medication List       This list is accurate as of: 05/19/13 12:12 PM.  Always use your most recent med list.               acetaminophen 500 MG tablet  Commonly known as:  TYLENOL  Take 1,000 mg by mouth every 4 (  four) hours as needed for pain.     cefTRIAXone 1 G injection  Commonly known as:  ROCEPHIN  Inject into the muscle daily.     diphenhydrAMINE 25 MG tablet  Commonly known as:  SOMINEX  Take 25 mg by mouth every 6 (six) hours as needed for sleep.     docusate sodium 100 MG capsule  Commonly known as:  COLACE  Take 100 mg by mouth 2 (two) times daily.     gabapentin 100 MG capsule  Commonly known as:  NEURONTIN  Take 100 mg by mouth 3 (three) times daily.     hydrOXYzine 50 MG tablet  Commonly known as:  ATARAX/VISTARIL  Take 50 mg by mouth 3 (three) times daily as needed for itching.     ipratropium-albuterol 0.5-2.5 (3) MG/3ML Soln  Commonly known as:  DUONEB  Take 3 mL by nebulization every 6 (six) hours as needed.     morphine 20 MG/ML  concentrated solution  Commonly known as:  ROXANOL  Give 0.35ml by mouth every 8 hours around the clock; Give 0.68ml by mouth every four hours as needed for pain        Meds ordered this encounter  Medications  . cefTRIAXone (ROCEPHIN) 1 G injection    Sig: Inject into the muscle daily.     There is no immunization history on file for this patient.  History  Substance Use Topics  . Smoking status: Never Smoker   . Smokeless tobacco: Never Used  . Alcohol Use: No    Family history is noncontributory    Review of Systems- unable sec to mental status;per nurse above    Filed Vitals:   05/19/13 1207  BP: 150/72  Pulse: 78  Temp: 99.5 F (37.5 C)  Resp: 24    Physical Exam  GENERAL APPEARANCE: Alert,. Appropriately groomed. No acute distress.  SKIN: No diaphoresis rash, or wounds HEENT- unremarkable RESPIRATORY: Breathing is even, unlabored. Lung sounds are coarse   CARDIOVASCULAR: Heart RRR no murmurs, rubs or gallops. No peripheral edema.   GASTROINTESTINAL: Abdomen is soft, non-tender, not distended w/ normal bowel sounds GENITOURINARY: Bladder non tender, not distended ; foley to SD dark urine MUSCULOSKELETAL: No abnormal joints or musculature NEUROLOGIC- cranial nerves 2-12 grossly intact. Moves all extremities no tremor.  Patient Active Problem List   Diagnosis Date Noted  . Stroke   . Increased oropharyngeal secretions 03/21/2013  . Bacteremia 03/17/2013  . Hypernatremia 03/17/2013  . Swelling of limb 03/16/2013  . Severe sepsis with septic shock 03/14/2013  . ARF (acute renal failure) 03/14/2013  . UTI (urinary tract infection) 03/14/2013  . Acute respiratory failure 03/14/2013  . Metabolic acidosis 03/14/2013  . Palliative care encounter 03/12/2013  . Pain in right extremity 03/12/2013  . Dyspnea 03/12/2013  . Anemia of chronic disease 01/15/2013  . Unspecified hypothyroidism   . Type II or unspecified type diabetes mellitus with neurological  manifestations, not stated as uncontrolled(250.60)   . Unspecified hereditary and idiopathic peripheral neuropathy   . COPD (chronic obstructive pulmonary disease)   . Unspecified constipation   . Rash and other nonspecific skin eruption       CBC    Component Value Date/Time   WBC 8.0 03/21/2013 0535   RBC 3.17* 03/21/2013 0535   RBC 4.05* 12/09/2009 1139   HGB 9.4* 03/21/2013 0535   HCT 29.4* 03/21/2013 0535   PLT 177 03/21/2013 0535   MCV 92.7 03/21/2013 0535   LYMPHSABS 0.2* 03/11/2013 1045  MONOABS 0.0* 03/11/2013 1045   EOSABS 0.0 03/11/2013 1045   BASOSABS 0.0 03/11/2013 1045    CMP     Component Value Date/Time   NA 140 03/21/2013 0535   K 3.9 03/21/2013 0535   CL 111 03/21/2013 0535   CO2 23 03/21/2013 0535   GLUCOSE 133* 03/21/2013 0535   BUN 18 03/21/2013 0535   CREATININE 1.13 03/21/2013 0535   CALCIUM 7.9* 03/21/2013 0535   PROT 5.3* 03/12/2013 0135   ALBUMIN 2.2* 03/12/2013 0135   AST 22 03/12/2013 0135   ALT 8 03/12/2013 0135   ALKPHOS 60 03/12/2013 0135   BILITOT 0.4 03/12/2013 0135   GFRNONAA 53* 03/21/2013 0535   GFRAA 61* 03/21/2013 0535    Assessment and Plan #1 Pt c/o pelvic pain-being tx for UTI and has foley in- morphine is not a good drug for him by hx-will Wyatt/c it and start Lortab 5 mg q6 prn  #2  Pt has nothing for agitation;haldol was used in the hospital;won't use that here but has been on ativan before so will start ativan 0.5 mg q6 prn  Margit Hanks, MD

## 2013-06-03 ENCOUNTER — Non-Acute Institutional Stay (SKILLED_NURSING_FACILITY): Payer: Medicare Other | Admitting: Nurse Practitioner

## 2013-06-03 DIAGNOSIS — R21 Rash and other nonspecific skin eruption: Secondary | ICD-10-CM

## 2013-06-03 DIAGNOSIS — K5900 Constipation, unspecified: Secondary | ICD-10-CM

## 2013-06-03 DIAGNOSIS — J4489 Other specified chronic obstructive pulmonary disease: Secondary | ICD-10-CM

## 2013-06-03 DIAGNOSIS — J449 Chronic obstructive pulmonary disease, unspecified: Secondary | ICD-10-CM

## 2013-06-03 NOTE — Progress Notes (Signed)
Patient ID: [name], male   DOB: 08-16-1915, 79 y.o.   MRN: [id number]   PCP: Lenoard Aden, NP   Allergies  Allergen Reactions  . Sulfa Antibiotics Other (See Comments)    unknown    Chief Complaint  Patient presents with  . Medical Managment of Chronic Issues    HPI:  79 year old male with PMH of advanced dementia, COPD, DM, peripheral neuropathy, chronic foley catheter due to obstructive BPH who is a LTR of [facility] now under hospice services is being seen today for routine follow up.  In the past month pt was noted to have fever however this has resolved; no noted blood in urine,  rash and open areas on legs have also resolved. Staff without any concerns at this time.   Review of Systems:  Review of Systems  Unable to perform ROS: dementia     Past Medical History  Diagnosis Date  . Unspecified hypothyroidism   . Unspecified hereditary and idiopathic peripheral neuropathy   . Chronic airway obstruction, not elsewhere classified   . Unspecified constipation   . Rash and other nonspecific skin eruption   . Pacemaker   . Hypertension   . Heart murmur   . CHF (congestive heart failure)   . History of duodenal ulcer     [name] 09/06/2009  (03/11/2013)  . Renal insufficiency     [name] 09/06/2009  (03/11/2013)  . Chronic atrial fibrillation     [name] 09/06/2009  (03/11/2013)  . Dysphagia     [name] 09/06/2009  (03/11/2013)  . Phlebitis     "I think he's had it to both legs" (03/11/2013)  . DVT (deep venous thrombosis)     LLE/notes 05/25/2010 (03/11/2013(  . Pneumonia     "a few times"/daughter (03/11/2013)  . Exertional dyspnea   . Type II or unspecified type diabetes mellitus with neurological manifestations, not stated as uncontrolled(250.60)     [name] 05/25/2010 (03/11/2013(  . Dementia     severe/notes 05/25/2010 (03/11/2013(  . Arthritis   . Chronic upper back pain   . Depression     "at times; maybe related to the dementia" (03/11/2013)  . UTI (urinary  tract infection)     "several at [facility]" (03/11/2013)  . Basal cell carcinoma of ear     left/notes 06/22/2008 (03/11/2013)  . Colon cancer   . Hard of hearing     "both ears" (03/11/2013)  . Stroke     "made him weaker on his left side" (03/11/2013)   Past Surgical History  Procedure Laterality Date  . Hernia repair    . Coronary artery bypass graft    . Mitral valve annuloplasty  05/2003    [name] 06/22/2008 (03/11/2013)  . Tonsillectomy      [name] 06/22/2008 (03/11/2013)  . Insert / replace / remove pacemaker      /notes 06/22/2008 (03/11/2013)  . Cataract extraction w/ intraocular lens  implant, bilateral      [name] 06/22/2008 (03/11/2013)  . Colectomy  1995; 1996    partial/notes 06/22/2008 (03/11/2013)  . Basal cell carcinoma excision Left     ear/notes 06/22/2008 (03/11/2013)   Social History:   reports that he has never smoked. He has never used smokeless tobacco. He reports that he does not drink alcohol or use illicit drugs.  No family history on file.  Medications: Patient's Medications  New Prescriptions   No medications on file  Previous Medications   ACETAMINOPHEN (TYLENOL) 500 MG TABLET  Take 1,000 mg by mouth every 4 (four) hours as needed for pain.    DIPHENHYDRAMINE (SOMINEX) 25 MG TABLET    Take 25 mg by mouth every 6 (six) hours as needed for sleep.   DOCUSATE SODIUM (COLACE) 100 MG CAPSULE    Take 100 mg by mouth 2 (two) times daily.   GABAPENTIN (NEURONTIN) 100 MG CAPSULE    Take 100 mg by mouth 3 (three) times daily.   HYDROCODONE-ACETAMINOPHEN (NORCO/VICODIN) 5-325 MG PER TABLET    Take 1 tablet by mouth every 6 (six) hours as needed for pain.   HYDROXYZINE (ATARAX/VISTARIL) 50 MG TABLET    Take 50 mg by mouth 3 (three) times daily as needed for itching.   IPRATROPIUM-ALBUTEROL (DUONEB) 0.5-2.5 (3) MG/3ML SOLN    Take 3 mL by nebulization every 6 (six) hours as needed.  Modified Medications   No medications on file  Discontinued Medications    MORPHINE (ROXANOL) 20 MG/ML CONCENTRATED SOLUTION    Give 0.53ml by mouth every 8 hours around the clock; Give 0.11ml by mouth every four hours as needed for pain     Physical Exam:  Filed Vitals:   06/03/13 1610  BP: 133/57  Pulse: 53  Temp: 98 F (36.7 C)  Resp: 20    GENERAL APPEARANCE: Appropriately groomed. No acute distress.  SKIN: open areas to bilateral knees have improved; without signs of infection  HEENT: unremarkable  RESPIRATORY: Breathing is even, unlabored . Lung sounds are diminished.  CARDIOVASCULAR: Heart RRR no murmurs, rubs or gallops. No peripheral edema.  ARTERIAL: radial pulse 2+  GASTROINTESTINAL: Abdomen is soft, non-tender, not distended w/ normal bowel sounds.  GENITOURINARY: Bladder non tender, not distended; urine clear and yellow  MUSCULOSKELETAL: No abnormal joints or musculature   Labs reviewed: Basic Metabolic Panel:  Recent Labs  96/04/54 0458 03/20/13 0758 03/21/13 0535  NA 149* 143 140  K 4.0 3.7 3.9  CL 117* 113* 111  CO2 20 21 23   GLUCOSE 142* 137* 133*  BUN 22 19 18   CREATININE 1.18 1.14 1.13  CALCIUM 8.1* 7.9* 7.9*   Liver Function Tests:  Recent Labs  03/11/13 1045 03/12/13 0135  AST 15 22  ALT 8 8  ALKPHOS 89 60  BILITOT 0.6 0.4  PROT 6.8 5.3*  ALBUMIN 3.0* 2.2*   No results found for this basename: LIPASE, AMYLASE,  in the last 8760 hours No results found for this basename: AMMONIA,  in the last 8760 hours CBC:  Recent Labs  03/11/13 1045  03/15/13 0500 03/20/13 0758 03/21/13 0535  WBC 1.8*  < > 9.7 8.1 8.0  NEUTROABS 1.6*  --   --   --   --   HGB 12.2*  < > 10.9* 9.8* 9.4*  HCT 37.8*  < > 34.0* 30.9* 29.4*  MCV 92.2  < > 91.6 94.2 92.7  PLT 135*  < > 126* 170 177  < > = values in this interval not displayed. Cardiac Enzymes: No results found for this basename: CKTOTAL, CKMB, CKMBINDEX, TROPONINI,  in the last 8760 hours BNP: No components found with this basename: POCBNP,  CBG:  Recent Labs   03/16/13 1152 03/16/13 1553 03/16/13 2002  GLUCAP 114* 170* 204*       Assessment/Plan 1. Unspecified constipation Patient is stable; continue current regimen. Will monitor and make changes as necessary.  2. COPD (chronic obstructive pulmonary disease) resp status is stable; no worsening shortness of breath   3. Rash and other nonspecific skin  eruption improved

## 2013-06-18 ENCOUNTER — Encounter: Payer: Self-pay | Admitting: Internal Medicine

## 2013-06-18 ENCOUNTER — Non-Acute Institutional Stay (SKILLED_NURSING_FACILITY): Payer: Medicare Other | Admitting: Internal Medicine

## 2013-06-18 DIAGNOSIS — J189 Pneumonia, unspecified organism: Secondary | ICD-10-CM

## 2013-06-18 DIAGNOSIS — Z8709 Personal history of other diseases of the respiratory system: Secondary | ICD-10-CM

## 2013-06-18 DIAGNOSIS — J449 Chronic obstructive pulmonary disease, unspecified: Secondary | ICD-10-CM

## 2013-06-18 NOTE — Progress Notes (Signed)
MRN: [id number] Name: [name]  Sex: male Age: 79 y.o. DOB: Nov 24, 1914  PSC #: [name] Facility/Room: 318 Level Of Care: SNF Provider: Merrilee Seashore D Emergency Contacts: Extended Emergency Contact Information Primary Emergency Contact: [name] Address: [address]          [place], [place] [address] [place] of [place] Home Phone: [phone] Relation: Brother Secondary Emergency Contact: [name] [place] Home Phone: [phone] Mobile Phone: [phone] Relation: Daughter  Code Status: DNR  Allergies: Sulfa antibiotics  Chief Complaint  Patient presents with  . Acute Visit    HPI: Patient is 79 y.o. male who I was asked to see by the hospice nurse.  Past Medical History  Diagnosis Date  . Unspecified hypothyroidism   . Unspecified hereditary and idiopathic peripheral neuropathy   . Chronic airway obstruction, not elsewhere classified   . Unspecified constipation   . Rash and other nonspecific skin eruption   . Pacemaker   . Hypertension   . Heart murmur   . CHF (congestive heart failure)   . History of duodenal ulcer     [name] 09/06/2009  (03/11/2013)  . Renal insufficiency     [name] 09/06/2009  (03/11/2013)  . Chronic atrial fibrillation     [name] 09/06/2009  (03/11/2013)  . Dysphagia     [name] 09/06/2009  (03/11/2013)  . Phlebitis     "I think he's had it to both legs" (03/11/2013)  . DVT (deep venous thrombosis)     LLE/notes 05/25/2010 (03/11/2013(  . Pneumonia     "a few times"/daughter (03/11/2013)  . Exertional dyspnea   . Type II or unspecified type diabetes mellitus with neurological manifestations, not stated as uncontrolled(250.60)     [name] 05/25/2010 (03/11/2013(  . Dementia     severe/notes 05/25/2010 (03/11/2013(  . Arthritis   . Chronic upper back pain   . Depression     "at times; maybe related to the dementia" (03/11/2013)  . UTI (urinary tract infection)     "several at [facility]" (03/11/2013)   . Basal cell carcinoma of ear     left/notes 06/22/2008 (03/11/2013)  . Colon cancer   . Hard of hearing     "both ears" (03/11/2013)  . Stroke     "made him weaker on his left side" (03/11/2013)    Past Surgical History  Procedure Laterality Date  . Hernia repair    . Coronary artery bypass graft    . Mitral valve annuloplasty  05/2003    [name] 06/22/2008 (03/11/2013)  . Tonsillectomy      [name] 06/22/2008 (03/11/2013)  . Insert / replace / remove pacemaker      /notes 06/22/2008 (03/11/2013)  . Cataract extraction w/ intraocular lens  implant, bilateral      [name] 06/22/2008 (03/11/2013)  . Colectomy  1995; 1996    partial/notes 06/22/2008 (03/11/2013)  . Basal cell carcinoma excision Left     ear/notes 06/22/2008 (03/11/2013)      Medication List       This list is accurate as of: 06/18/13  3:15 PM.  Always use your most recent med list.               acetaminophen 500 MG tablet  Commonly known as:  TYLENOL  Take 1,000 mg by mouth every 4 (four) hours as needed for pain.     diphenhydrAMINE 25 MG tablet  Commonly known as:  SOMINEX  Take 25 mg by mouth every 6 (six) hours as needed  for sleep.     docusate sodium 100 MG capsule  Commonly known as:  COLACE  Take 100 mg by mouth 2 (two) times daily.     gabapentin 100 MG capsule  Commonly known as:  NEURONTIN  Take 100 mg by mouth 3 (three) times daily.     HYDROcodone-acetaminophen 5-325 MG per tablet  Commonly known as:  NORCO/VICODIN  Take 1 tablet by mouth every 6 (six) hours as needed for pain.     hydrOXYzine 50 MG tablet  Commonly known as:  ATARAX/VISTARIL  Take 50 mg by mouth 3 (three) times daily as needed for itching.     ipratropium-albuterol 0.5-2.5 (3) MG/3ML Soln  Commonly known as:  DUONEB  Take 3 mL by nebulization every 6 (six) hours as needed.        No orders of the defined types were placed in this encounter.     There is no immunization history on file for this  patient.  History  Substance Use Topics  . Smoking status: Never Smoker   . Smokeless tobacco: Never Used  . Alcohol Use: No    Review of Systems  DATA OBTAINED: from hospice nurse; she reported to me that PT HAS BEEN CONGESTED FOR 3 DAYS AND SHE FINDS HIM THIS AFTERNOON SOUNDING JUNKY WITH A RR OF 44 AND o2 SAT 0N 2 LITERS 92%  Filed Vitals:   06/18/13 1508  BP: 125/56  Pulse: 60  Temp: 98 F (36.7 C)  Resp: 20    Physical Exam  GENERAL APPEARANCE: Alert, MINIMALLY conversant. Appropriately groomed. No acute distress  SKIN: No diaphoresis rash, or wounds HEENT: Unremarkable RESPIRATORY: Breathing is even, unlabored. Lung sounds are decent with possible rales on L;hard to hear at bases  ; O2 SAT PER ME AT BEDSIDE ON 4L WAS 96% CARDIOVASCULAR: Heart RRR with 2/45murmur and PVC's, no rubs or gallops. No peripheral edema  GASTROINTESTINAL: Abdomen is soft, non-tender, not distended w/ normal bowel sounds.  GENITOURINARY: Bladder non tender, not distended  MUSCULOSKELETAL: No abnormal joints or musculature NEUROLOGIC: Cranial nerves 2-12 grossly intact  Patient Active Problem List   Diagnosis Date Noted  . PNA (pneumonia) 06/18/2013  . Stroke   . Increased oropharyngeal secretions 03/21/2013  . Bacteremia 03/17/2013  . Hypernatremia 03/17/2013  . Swelling of limb 03/16/2013  . Severe sepsis with septic shock 03/14/2013  . ARF (acute renal failure) 03/14/2013  . UTI (urinary tract infection) 03/14/2013  . Acute respiratory failure 03/14/2013  . Metabolic acidosis 03/14/2013  . Palliative care encounter 03/12/2013  . Pain in right extremity 03/12/2013  . Dyspnea 03/12/2013  . Anemia of chronic disease 01/15/2013  . Unspecified hypothyroidism   . Type II or unspecified type diabetes mellitus with neurological manifestations, not stated as uncontrolled(250.60)   . Unspecified hereditary and idiopathic peripheral neuropathy   . COPD (chronic obstructive pulmonary disease)    . Unspecified constipation   . Rash and other nonspecific skin eruption     CBC    Component Value Date/Time   WBC 8.0 03/21/2013 0535   RBC 3.17* 03/21/2013 0535   RBC 4.05* 12/09/2009 1139   HGB 9.4* 03/21/2013 0535   HCT 29.4* 03/21/2013 0535   PLT 177 03/21/2013 0535   MCV 92.7 03/21/2013 0535   LYMPHSABS 0.2* 03/11/2013 1045   MONOABS 0.0* 03/11/2013 1045   EOSABS 0.0 03/11/2013 1045   BASOSABS 0.0 03/11/2013 1045    CMP     Component Value Date/Time   NA 140  03/21/2013 0535   K 3.9 03/21/2013 0535   CL 111 03/21/2013 0535   CO2 23 03/21/2013 0535   GLUCOSE 133* 03/21/2013 0535   BUN 18 03/21/2013 0535   CREATININE 1.13 03/21/2013 0535   CALCIUM 7.9* 03/21/2013 0535   PROT 5.3* 03/12/2013 0135   ALBUMIN 2.2* 03/12/2013 0135   AST 22 03/12/2013 0135   ALT 8 03/12/2013 0135   ALKPHOS 60 03/12/2013 0135   BILITOT 0.4 03/12/2013 0135   GFRNONAA 53* 03/21/2013 0535   GFRAA 61* 03/21/2013 0535    Assessment and Plan  PNA (pneumonia) PT'S O2 has been increased to 4L [place]; rocephin 1 gm IM for 7 days;hospice nurse had spoken to son and he was OK with tx that did not require hospitalization; Schedule duoneb q6; O2 to keep sats> 94%    Margit Hanks, MD

## 2013-06-18 NOTE — Assessment & Plan Note (Addendum)
PT'S O2 has been increased to 4L [place]; rocephin 1 gm IM for 7 days;hospice nurse had spoken to son and he was OK with tx that did not require hospitalization; Schedule duoneb q6; O2 to keep sats> 94%; this of course is in the setting of of COPD and chronic respiratory failure.

## 2013-07-18 ENCOUNTER — Non-Acute Institutional Stay (SKILLED_NURSING_FACILITY): Payer: Medicare Other | Admitting: Nurse Practitioner

## 2013-07-18 DIAGNOSIS — R21 Rash and other nonspecific skin eruption: Secondary | ICD-10-CM

## 2013-07-18 DIAGNOSIS — N390 Urinary tract infection, site not specified: Secondary | ICD-10-CM

## 2013-07-18 DIAGNOSIS — K5900 Constipation, unspecified: Secondary | ICD-10-CM

## 2013-07-18 DIAGNOSIS — J4489 Other specified chronic obstructive pulmonary disease: Secondary | ICD-10-CM

## 2013-07-18 DIAGNOSIS — J449 Chronic obstructive pulmonary disease, unspecified: Secondary | ICD-10-CM

## 2013-07-18 NOTE — Progress Notes (Signed)
Patient ID: BUCKLEY BRADLY, male   DOB: [birth year], 79 y.o.   MRN: [id number] Nursing Home Location:  [facility]   Place of Service: SNF (31)  PCP: Lenoard Aden, NP   Allergies  Allergen Reactions  . Sulfa Antibiotics Other (See Comments)    unknown    Chief Complaint  Patient presents with  . Medical Managment of Chronic Issues    HPI:  79 year old male with PMH of advanced dementia, COPD, DM, peripheral neuropathy, chronic foley catheter due to obstructive BPH who is a LTR of [facility] now under hospice services is being seen today for routine follow up. Has been treated for PNE and UTI in the past month; currently durring well; no fevers, shortness of breath, cough is stable; staff reports pt is eating well and without any concerns at this time.   Review of Systems:  Review of Systems  Unable to perform ROS    Past Medical History  Diagnosis Date  . Unspecified hypothyroidism   . Unspecified hereditary and idiopathic peripheral neuropathy   . Chronic airway obstruction, not elsewhere classified   . Unspecified constipation   . Rash and other nonspecific skin eruption   . Pacemaker   . Hypertension   . Heart murmur   . CHF (congestive heart failure)   . History of duodenal ulcer     [name] 09/06/2009  (03/11/2013)  . Renal insufficiency     [name] 09/06/2009  (03/11/2013)  . Chronic atrial fibrillation     [name] 09/06/2009  (03/11/2013)  . Dysphagia     [name] 09/06/2009  (03/11/2013)  . Phlebitis     "I think he's had it to both legs" (03/11/2013)  . DVT (deep venous thrombosis)     LLE/notes 05/25/2010 (03/11/2013(  . Pneumonia     "a few times"/daughter (03/11/2013)  . Exertional dyspnea   . Type II or unspecified type diabetes mellitus with neurological manifestations, not stated as uncontrolled(250.60)     [name] 05/25/2010 (03/11/2013(  . Dementia     severe/notes 05/25/2010 (03/11/2013(  . Arthritis   . Chronic upper back pain   .  Depression     "at times; maybe related to the dementia" (03/11/2013)  . UTI (urinary tract infection)     "several at [facility]" (03/11/2013)  . Basal cell carcinoma of ear     left/notes 06/22/2008 (03/11/2013)  . Colon cancer   . Hard of hearing     "both ears" (03/11/2013)  . Stroke     "made him weaker on his left side" (03/11/2013)   Past Surgical History  Procedure Laterality Date  . Hernia repair    . Coronary artery bypass graft    . Mitral valve annuloplasty  05/2003    [name] 06/22/2008 (03/11/2013)  . Tonsillectomy      [name] 06/22/2008 (03/11/2013)  . Insert / replace / remove pacemaker      /notes 06/22/2008 (03/11/2013)  . Cataract extraction w/ intraocular lens  implant, bilateral      [name] 06/22/2008 (03/11/2013)  . Colectomy  1995; 1996    partial/notes 06/22/2008 (03/11/2013)  . Basal cell carcinoma excision Left     ear/notes 06/22/2008 (03/11/2013)   Social History:   reports that he has never smoked. He has never used smokeless tobacco. He reports that he does not drink alcohol or use illicit drugs.  No family history on file.  Medications: Patient's Medications  New Prescriptions   No medications on file  Previous Medications   ACETAMINOPHEN (TYLENOL) 500 MG TABLET    Take 1,000 mg by mouth every 4 (four) hours as needed for pain.    DIPHENHYDRAMINE (SOMINEX) 25 MG TABLET    Take 25 mg by mouth every 6 (six) hours as needed for sleep.   DOCUSATE SODIUM (COLACE) 100 MG CAPSULE    Take 100 mg by mouth 2 (two) times daily.   GABAPENTIN (NEURONTIN) 100 MG CAPSULE    Take 100 mg by mouth 3 (three) times daily.   HYDROCODONE-ACETAMINOPHEN (NORCO/VICODIN) 5-325 MG PER TABLET    Take 1 tablet by mouth every 6 (six) hours as needed for pain.   HYDROXYZINE (ATARAX/VISTARIL) 50 MG TABLET    Take 50 mg by mouth 3 (three) times daily as needed for itching.   IPRATROPIUM-ALBUTEROL (DUONEB) 0.5-2.5 (3) MG/3ML SOLN    Take 3 mL by nebulization every 6 (six) hours as  needed.   LORAZEPAM (ATIVAN) 0.5 MG TABLET    Take 0.5 mg by mouth every 8 (eight) hours.  Modified Medications   No medications on file  Discontinued Medications   No medications on file     Physical Exam: Physical Exam  Nursing note and vitals reviewed. Constitutional: He is well-developed, well-nourished, and in no distress. No distress.  HENT:  Head: Normocephalic and atraumatic.  Mouth/Throat: Oropharynx is clear and moist. No oropharyngeal exudate.  Eyes: Conjunctivae and EOM are normal. Pupils are equal, round, and reactive to light.  Neck: Normal range of motion. Neck supple.  Cardiovascular: Normal rate, regular rhythm and normal heart sounds.   Pulmonary/Chest: Effort normal. No respiratory distress.  Coarse breath sounds throughout   Abdominal: Soft. Bowel sounds are normal. He exhibits no distension. There is no tenderness.  Musculoskeletal: He exhibits no edema and no tenderness.  Neurological: He is alert.  Skin: Skin is warm and dry. He is not diaphoretic.    Filed Vitals:   07/18/13 1316  BP: 116/63  Pulse: 60  Temp: 97.8 F (36.6 C)  Resp: 20  Weight: 164 lb (74.39 kg)      Labs reviewed: Basic Metabolic Panel:  Recent Labs  56/21/30 0458 03/20/13 0758 03/21/13 0535  NA 149* 143 140  K 4.0 3.7 3.9  CL 117* 113* 111  CO2 20 21 23   GLUCOSE 142* 137* 133*  BUN 22 19 18   CREATININE 1.18 1.14 1.13  CALCIUM 8.1* 7.9* 7.9*   Liver Function Tests:  Recent Labs  03/11/13 1045 03/12/13 0135  AST 15 22  ALT 8 8  ALKPHOS 89 60  BILITOT 0.6 0.4  PROT 6.8 5.3*  ALBUMIN 3.0* 2.2*   No results found for this basename: LIPASE, AMYLASE,  in the last 8760 hours No results found for this basename: AMMONIA,  in the last 8760 hours CBC:  Recent Labs  03/11/13 1045  03/15/13 0500 03/20/13 0758 03/21/13 0535  WBC 1.8*  < > 9.7 8.1 8.0  NEUTROABS 1.6*  --   --   --   --   HGB 12.2*  < > 10.9* 9.8* 9.4*  HCT 37.8*  < > 34.0* 30.9* 29.4*  MCV  92.2  < > 91.6 94.2 92.7  PLT 135*  < > 126* 170 177  < > = values in this interval not displayed. Cardiac Enzymes: No results found for this basename: CKTOTAL, CKMB, CKMBINDEX, TROPONINI,  in the last 8760 hours BNP: No components found with this basename: POCBNP,  CBG:  Recent Labs  03/16/13 1152 03/16/13  1553 03/16/13 2002  GLUCAP 114* 170* 204*     Assessment/Plan 1. COPD (chronic obstructive pulmonary disease) -Patient is stable; continue current regimen. Will monitor and make changes as necessary.  2. Unspecified constipation -stable at this time on current medications  3. Rash and other nonspecific skin eruption -resolved  4. UTI (urinary tract infection) -finished antibiotics; doing well, urine clear, will cont to monitor

## 2013-08-19 ENCOUNTER — Non-Acute Institutional Stay (SKILLED_NURSING_FACILITY): Payer: Medicare Other | Admitting: Nurse Practitioner

## 2013-08-19 ENCOUNTER — Encounter: Payer: Self-pay | Admitting: Nurse Practitioner

## 2013-08-19 DIAGNOSIS — G609 Hereditary and idiopathic neuropathy, unspecified: Secondary | ICD-10-CM

## 2013-08-19 DIAGNOSIS — K5900 Constipation, unspecified: Secondary | ICD-10-CM

## 2013-08-19 DIAGNOSIS — E039 Hypothyroidism, unspecified: Secondary | ICD-10-CM

## 2013-08-19 DIAGNOSIS — J449 Chronic obstructive pulmonary disease, unspecified: Secondary | ICD-10-CM

## 2013-08-19 DIAGNOSIS — E1149 Type 2 diabetes mellitus with other diabetic neurological complication: Secondary | ICD-10-CM

## 2013-08-19 DIAGNOSIS — R21 Rash and other nonspecific skin eruption: Secondary | ICD-10-CM

## 2013-08-19 NOTE — Progress Notes (Signed)
Patient ID: [name], male   DOB: 07-03-1915, 79 y.o.   MRN: [id number]    Nursing Home Location:  [facility]   Place of Service: SNF (31)  PCP: Lenoard Aden, NP  Allergies  Allergen Reactions  . Sulfa Antibiotics Other (See Comments)    unknown    Chief Complaint  Patient presents with  . Medical Managment of Chronic Issues    HPI:  79 year old male with PMH of advanced dementia, COPD, DM, peripheral neuropathy, chronic foley catheter due to obstructive BPH who is a LTR of [facility] now under hospice services and is being seen today for routine follow up. Nursing reports other than occasionally coughing that improves with PRN breathing treatment pt is doing well and staff without concerns  Review of Systems:  Unable to obtain  Past Medical History  Diagnosis Date  . Unspecified hypothyroidism   . Unspecified hereditary and idiopathic peripheral neuropathy   . Chronic airway obstruction, not elsewhere classified   . Unspecified constipation   . Rash and other nonspecific skin eruption   . Pacemaker   . Hypertension   . Heart murmur   . CHF (congestive heart failure)   . History of duodenal ulcer     [name] 09/06/2009  (03/11/2013)  . Renal insufficiency     [name] 09/06/2009  (03/11/2013)  . Chronic atrial fibrillation     [name] 09/06/2009  (03/11/2013)  . Dysphagia     [name] 09/06/2009  (03/11/2013)  . Phlebitis     "I think he's had it to both legs" (03/11/2013)  . DVT (deep venous thrombosis)     LLE/notes 05/25/2010 (03/11/2013(  . Pneumonia     "a few times"/daughter (03/11/2013)  . Exertional dyspnea   . Type II or unspecified type diabetes mellitus with neurological manifestations, not stated as uncontrolled(250.60)     [name] 05/25/2010 (03/11/2013(  . Dementia     severe/notes 05/25/2010 (03/11/2013(  . Arthritis   . Chronic upper back pain   . Depression     "at times; maybe related to the dementia" (03/11/2013)  . UTI (urinary  tract infection)     "several at [facility]" (03/11/2013)  . Basal cell carcinoma of ear     left/notes 06/22/2008 (03/11/2013)  . Colon cancer   . Hard of hearing     "both ears" (03/11/2013)  . Stroke     "made him weaker on his left side" (03/11/2013)   Past Surgical History  Procedure Laterality Date  . Hernia repair    . Coronary artery bypass graft    . Mitral valve annuloplasty  05/2003    [name] 06/22/2008 (03/11/2013)  . Tonsillectomy      [name] 06/22/2008 (03/11/2013)  . Insert / replace / remove pacemaker      /notes 06/22/2008 (03/11/2013)  . Cataract extraction w/ intraocular lens  implant, bilateral      [name] 06/22/2008 (03/11/2013)  . Colectomy  1995; 1996    partial/notes 06/22/2008 (03/11/2013)  . Basal cell carcinoma excision Left     ear/notes 06/22/2008 (03/11/2013)   Social History:   reports that he has never smoked. He has never used smokeless tobacco. He reports that he does not drink alcohol or use illicit drugs.  No family history on file.  Medications: Patient's Medications  New Prescriptions   No medications on file  Previous Medications   ACETAMINOPHEN (TYLENOL) 500 MG TABLET    Take 1,000 mg by mouth every 4 (four)  hours as needed for pain.    DIPHENHYDRAMINE (SOMINEX) 25 MG TABLET    Take 25 mg by mouth every 6 (six) hours as needed for sleep.   DOCUSATE SODIUM (COLACE) 100 MG CAPSULE    Take 100 mg by mouth 2 (two) times daily.   GABAPENTIN (NEURONTIN) 100 MG CAPSULE    Take 100 mg by mouth 3 (three) times daily.   HYDROCODONE-ACETAMINOPHEN (NORCO/VICODIN) 5-325 MG PER TABLET    Take 1 tablet by mouth every 6 (six) hours as needed for pain.   HYDROXYZINE (ATARAX/VISTARIL) 50 MG TABLET    Take 50 mg by mouth 3 (three) times daily as needed for itching.   IPRATROPIUM-ALBUTEROL (DUONEB) 0.5-2.5 (3) MG/3ML SOLN    Take 3 mL by nebulization every 6 (six) hours as needed.   LORAZEPAM (ATIVAN) 0.5 MG TABLET    Take 0.5 mg by mouth every 8 (eight) hours.    Modified Medications   No medications on file  Discontinued Medications   No medications on file     Physical Exam: .Physical Exam  Nursing note and vitals reviewed. Constitutional: He is well-developed, well-nourished, and in no distress. No distress.  HENT:  Head: Normocephalic and atraumatic.  Mouth/Throat: Oropharynx is clear and moist. No oropharyngeal exudate.  Eyes: Conjunctivae and EOM are normal. Pupils are equal, round, and reactive to light.  Neck: Normal range of motion. Neck supple.  Cardiovascular: Normal rate, regular rhythm and normal heart sounds.   Pulmonary/Chest: Effort normal. No respiratory distress.  Coarse breath sounds throughout   Abdominal: Soft. Bowel sounds are normal. He exhibits no distension. There is no tenderness.  Musculoskeletal: He exhibits no edema and no tenderness.  Neurological: He is alert.  Skin: Skin is warm and dry. He is not diaphoretic.    Filed Vitals:   08/19/13 1502  BP: 128/72  Pulse: 60  Temp: 97.3 F (36.3 C)  Resp: 20      Labs reviewed: Basic Metabolic Panel:  Recent Labs  46/96/29 0458 03/20/13 0758 03/21/13 0535  NA 149* 143 140  K 4.0 3.7 3.9  CL 117* 113* 111  CO2 20 21 23   GLUCOSE 142* 137* 133*  BUN 22 19 18   CREATININE 1.18 1.14 1.13  CALCIUM 8.1* 7.9* 7.9*   Liver Function Tests:  Recent Labs  03/11/13 1045 03/12/13 0135  AST 15 22  ALT 8 8  ALKPHOS 89 60  BILITOT 0.6 0.4  PROT 6.8 5.3*  ALBUMIN 3.0* 2.2*   No results found for this basename: LIPASE, AMYLASE,  in the last 8760 hours No results found for this basename: AMMONIA,  in the last 8760 hours CBC:  Recent Labs  03/11/13 1045  03/15/13 0500 03/20/13 0758 03/21/13 0535  WBC 1.8*  < > 9.7 8.1 8.0  NEUTROABS 1.6*  --   --   --   --   HGB 12.2*  < > 10.9* 9.8* 9.4*  HCT 37.8*  < > 34.0* 30.9* 29.4*  MCV 92.2  < > 91.6 94.2 92.7  PLT 135*  < > 126* 170 177  < > = values in this interval not displayed. Cardiac  Enzymes: No results found for this basename: CKTOTAL, CKMB, CKMBINDEX, TROPONINI,  in the last 8760 hours BNP: No components found with this basename: POCBNP,  CBG:  Recent Labs  03/16/13 1152 03/16/13 1553 03/16/13 2002  GLUCAP 114* 170* 204*  TSH, Ultrasensitive    Result: 11/07/2012 2:21 PM   ( Status: F )  TSH 1.897     0.350-4.500 uIU/mL  Assessment/Plan 1. COPD (chronic obstructive pulmonary disease) -resp status remains stable; occasional cough -cont PRN  2. Unspecified constipation -cont colace  3. Unspecified hypothyroidism -not on medication -will follow up TSH  4. Type II or unspecified type diabetes mellitus with neurological manifestations, not stated as uncontrolled(250.60) -diet controlled; not on medications at this time, not checking blood sugars  -follow up A1c  5. Unspecified hereditary and idiopathic peripheral neuropathy -conts on gabapentin  6. Rash and other nonspecific skin eruption -remains stable

## 2013-08-29 ENCOUNTER — Encounter: Payer: Self-pay | Admitting: Interventional Cardiology

## 2013-09-12 ENCOUNTER — Ambulatory Visit (INDEPENDENT_AMBULATORY_CARE_PROVIDER_SITE_OTHER): Admitting: *Deleted

## 2013-09-12 DIAGNOSIS — I442 Atrioventricular block, complete: Secondary | ICD-10-CM

## 2013-09-12 NOTE — Progress Notes (Signed)
Pacer check at nursing home.

## 2013-09-16 IMAGING — DX DG CHEST 1V PORT
1 series · 1 of 1 positions shown · non-contrast
Comparison: PA and lateral chest 06/14/2010 single view of the
chest 03/11/2013.

CLINICAL DATA: Hypoxia.

PORTABLE CHEST - 1 VIEW

[portable]
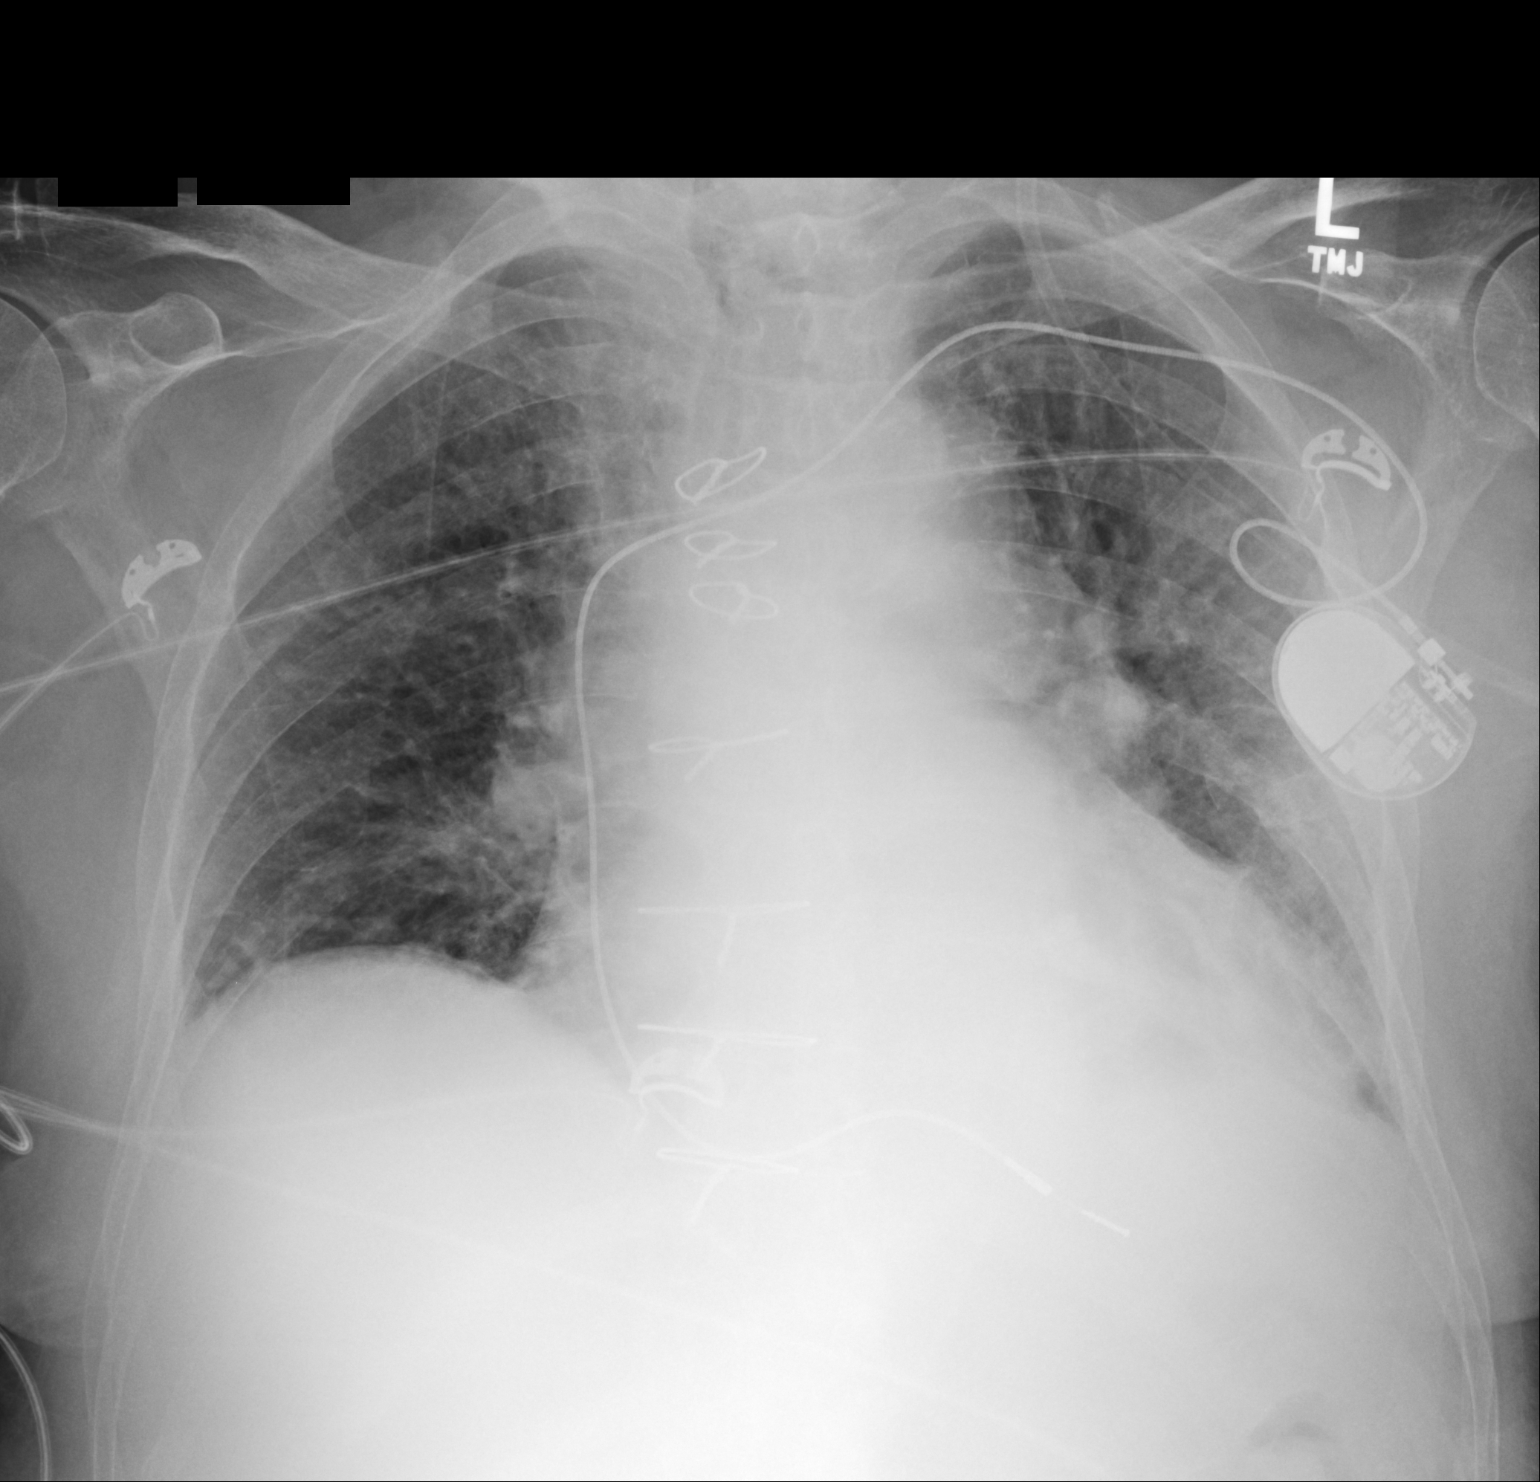

[1 of 1 positions shown; findings below may reference images not displayed]

FINDINGS: Pacing device again noted.  There is cardiomegaly and
vascular congestion.  Increased airspace opacity in the left base
is likely due to atelectasis.  No pneumothorax or pleural fluid.
IMPRESSION: 1.  Cardiomegaly and vascular congestion.
2.  Increased left basilar airspace opacity is likely due to
atelectasis.

## 2013-09-18 LAB — MDC_IDC_ENUM_SESS_TYPE_INCLINIC
Battery Impedance: 3805 Ohm
Battery Remaining Longevity: 17 mo
Battery Voltage: 2.7 V
Date Time Interrogation Session: 20150116175137
Lead Channel Pacing Threshold Amplitude: 0.5 V
Lead Channel Pacing Threshold Pulse Width: 0.4 ms
Lead Channel Setting Sensing Sensitivity: 2.8 mV
MDC IDC MSMT LEADCHNL RA IMPEDANCE VALUE: 0 Ohm
MDC IDC MSMT LEADCHNL RV IMPEDANCE VALUE: 594 Ohm
MDC IDC MSMT LEADCHNL RV SENSING INTR AMPL: 8 mV
MDC IDC SET LEADCHNL RV PACING AMPLITUDE: 2.5 V
MDC IDC SET LEADCHNL RV PACING PULSEWIDTH: 0.4 ms
MDC IDC STAT BRADY RV PERCENT PACED: 52 %

## 2013-09-19 ENCOUNTER — Non-Acute Institutional Stay (SKILLED_NURSING_FACILITY): Payer: Medicare Other | Admitting: Nurse Practitioner

## 2013-09-19 DIAGNOSIS — Z9889 Other specified postprocedural states: Secondary | ICD-10-CM

## 2013-09-19 DIAGNOSIS — E039 Hypothyroidism, unspecified: Secondary | ICD-10-CM

## 2013-09-19 DIAGNOSIS — R21 Rash and other nonspecific skin eruption: Secondary | ICD-10-CM

## 2013-09-19 DIAGNOSIS — J449 Chronic obstructive pulmonary disease, unspecified: Secondary | ICD-10-CM

## 2013-09-19 DIAGNOSIS — Z960 Presence of urogenital implants: Secondary | ICD-10-CM

## 2013-09-19 DIAGNOSIS — G609 Hereditary and idiopathic neuropathy, unspecified: Secondary | ICD-10-CM

## 2013-09-19 DIAGNOSIS — Z978 Presence of other specified devices: Secondary | ICD-10-CM

## 2013-09-19 DIAGNOSIS — K5900 Constipation, unspecified: Secondary | ICD-10-CM

## 2013-09-19 NOTE — Progress Notes (Signed)
Patient ID: [name], male   DOB: 20-Dec-1914, 80 y.o.   MRN: [id number]    Nursing Home Location:  [facility] of Service: SNF (31)  PCP: Delrae Rend, NP  Allergies  Allergen Reactions  . Sulfa Antibiotics Other (See Comments)    unknown    Chief Complaint  Patient presents with  . Medical Managment of Chronic Issues    HPI:  80 year old male with PMH of advanced dementia, COPD, DM, peripheral neuropathy, chronic foley catheter due to obstructive BPH who is a LTR of [facility] now under hospice services and is being seen today for routine follow up.nursing without any concerns today; there has been little to no change in the last month Review of Systems:  Review of Systems  Unable to perform ROS    Past Medical History  Diagnosis Date  . Unspecified hypothyroidism   . Unspecified hereditary and idiopathic peripheral neuropathy   . Chronic airway obstruction, not elsewhere classified   . Unspecified constipation   . Rash and other nonspecific skin eruption   . Pacemaker   . Hypertension   . Heart murmur   . CHF (congestive heart failure)   . History of duodenal ulcer     Archie Endo 09/06/2009  (03/11/2013)  . Renal insufficiency     Archie Endo 09/06/2009  (03/11/2013)  . Chronic atrial fibrillation     Archie Endo 09/06/2009  (03/11/2013)  . Dysphagia     Archie Endo 09/06/2009  (03/11/2013)  . Phlebitis     "I think he's had it to both legs" (03/11/2013)  . DVT (deep venous thrombosis)     LLE/notes 05/25/2010 (03/11/2013(  . Pneumonia     "a few times"/daughter (03/11/2013)  . Exertional dyspnea   . Type II or unspecified type diabetes mellitus with neurological manifestations, not stated as uncontrolled     Archie Endo 05/25/2010 (03/11/2013(  . Dementia     severe/notes 05/25/2010 (03/11/2013(  . Arthritis   . Chronic upper back pain   . Depression     "at times; maybe related to the dementia" (03/11/2013)  . UTI (urinary tract infection)     "several at  [facility]" (03/11/2013)  . Basal cell carcinoma of ear     left/notes 06/22/2008 (03/11/2013)  . Colon cancer   . Hard of hearing     "both ears" (03/11/2013)  . Stroke     "made him weaker on his left side" (03/11/2013)   Past Surgical History  Procedure Laterality Date  . Hernia repair    . Coronary artery bypass graft    . Mitral valve annuloplasty  05/2003    Archie Endo 06/22/2008 (03/11/2013)  . Tonsillectomy      Archie Endo 06/22/2008 (03/11/2013)  . Insert / replace / remove pacemaker      /notes 06/22/2008 (03/11/2013)  . Cataract extraction w/ intraocular lens  implant, bilateral      Archie Endo 06/22/2008 (03/11/2013)  . Colectomy  1995; 1996    partial/notes 06/22/2008 (03/11/2013)  . Basal cell carcinoma excision Left     ear/notes 06/22/2008 (03/11/2013)   Social History:   reports that he has never smoked. He has never used smokeless tobacco. He reports that he does not drink alcohol or use illicit drugs.  No family history on file.  Medications: Patient's Medications  New Prescriptions   No medications on file  Previous Medications   ACETAMINOPHEN (TYLENOL) 500 MG TABLET    Take 1,000 mg by mouth every 4 (  four) hours as needed for pain.    DIPHENHYDRAMINE (SOMINEX) 25 MG TABLET    Take 25 mg by mouth every 6 (six) hours as needed for sleep.   DOCUSATE SODIUM (COLACE) 100 MG CAPSULE    Take 100 mg by mouth 2 (two) times daily.   GABAPENTIN (NEURONTIN) 100 MG CAPSULE    Take 100 mg by mouth 3 (three) times daily.   HYDROCODONE-ACETAMINOPHEN (NORCO/VICODIN) 5-325 MG PER TABLET    Take 1 tablet by mouth every 6 (six) hours as needed for pain.   HYDROXYZINE (ATARAX/VISTARIL) 50 MG TABLET    Take 50 mg by mouth 3 (three) times daily as needed for itching.   IPRATROPIUM-ALBUTEROL (DUONEB) 0.5-2.5 (3) MG/3ML SOLN    Take 3 mL by nebulization every 6 (six) hours as needed.   LORAZEPAM (ATIVAN) 0.5 MG TABLET    Take 0.5 mg by mouth every 8 (eight) hours.  Modified Medications   No  medications on file  Discontinued Medications   No medications on file     Physical Exam:  Filed Vitals:   09/19/13 1725  BP: 117/59  Pulse: 60  Temp: 98.1 F (36.7 C)  Resp: 20  SpO2: 96%   Nursing note and vitals reviewed.  Constitutional: He is well-developed, well-nourished, and in no distress. No distress.  HENT:  Head: Normocephalic and atraumatic.  Mouth/Throat: Oropharynx is clear and moist. No oropharyngeal exudate.  Eyes: Conjunctivae and EOM are normal. Pupils are equal, round, and reactive to light.  Neck: Normal range of motion. Neck supple.  Cardiovascular: Normal rate, regular rhythm; has murmur  Pulmonary/Chest: Effort normal. No respiratory distress.  Coarse breath sounds throughout  Abdominal: Soft. Bowel sounds are normal. He exhibits no distension. There is no tenderness.  Musculoskeletal: He exhibits no edema and no tenderness.  Neurological: He is alert.  Skin: Skin is warm and dry. He is not diaphoretic.      Labs reviewed: Basic Metabolic Panel:  Recent Labs  03/19/13 0458 03/20/13 0758 03/21/13 0535  NA 149* 143 140  K 4.0 3.7 3.9  CL 117* 113* 111  CO2 20 21 23   GLUCOSE 142* 137* 133*  BUN 22 19 18   CREATININE 1.18 1.14 1.13  CALCIUM 8.1* 7.9* 7.9*   Liver Function Tests:  Recent Labs  03/11/13 1045 03/12/13 0135  AST 15 22  ALT 8 8  ALKPHOS 89 60  BILITOT 0.6 0.4  PROT 6.8 5.3*  ALBUMIN 3.0* 2.2*   No results found for this basename: LIPASE, AMYLASE,  in the last 8760 hours No results found for this basename: AMMONIA,  in the last 8760 hours CBC:  Recent Labs  03/11/13 1045  03/15/13 0500 03/20/13 0758 03/21/13 0535  WBC 1.8*  < > 9.7 8.1 8.0  NEUTROABS 1.6*  --   --   --   --   HGB 12.2*  < > 10.9* 9.8* 9.4*  HCT 37.8*  < > 34.0* 30.9* 29.4*  MCV 92.2  < > 91.6 94.2 92.7  PLT 135*  < > 126* 170 177  < > = values in this interval not displayed.  TSH, Ultrasensitive    Result: 08/22/2013 2:59 PM   ( Status:  F )     C TSH 1.136     0.350-4.500 uIU/mL SLN    Assessment/Plan 1. COPD (chronic obstructive pulmonary disease) -stable at this time; no worsening cough, congestion or drop in O2 sats  2. Unspecified constipation -stable on current medications  3.  Unspecified hypothyroidism -currently not on medication ; TSH stable  4. Unspecified hereditary and idiopathic peripheral neuropathy -STABLE  5. Rash and other nonspecific skin eruption -stable; without exacerbation; conts with PRN of itch  6. Indwelling catheter, chronic Due to retention; changing foley as needed

## 2013-09-24 ENCOUNTER — Encounter: Payer: Self-pay | Admitting: Internal Medicine

## 2013-10-07 ENCOUNTER — Non-Acute Institutional Stay (SKILLED_NURSING_FACILITY): Payer: Medicare Other | Admitting: Nurse Practitioner

## 2013-10-07 ENCOUNTER — Encounter: Payer: Self-pay | Admitting: Nurse Practitioner

## 2013-10-07 DIAGNOSIS — R197 Diarrhea, unspecified: Secondary | ICD-10-CM

## 2013-10-07 DIAGNOSIS — E039 Hypothyroidism, unspecified: Secondary | ICD-10-CM

## 2013-10-07 DIAGNOSIS — N390 Urinary tract infection, site not specified: Secondary | ICD-10-CM

## 2013-10-07 DIAGNOSIS — F0150 Vascular dementia without behavioral disturbance: Secondary | ICD-10-CM | POA: Insufficient documentation

## 2013-10-07 DIAGNOSIS — R21 Rash and other nonspecific skin eruption: Secondary | ICD-10-CM

## 2013-10-07 DIAGNOSIS — J449 Chronic obstructive pulmonary disease, unspecified: Secondary | ICD-10-CM

## 2013-10-07 NOTE — Progress Notes (Signed)
Patient ID: [name], male   DOB: 18-Sep-1914, 80 y.o.   MRN: [id number]    Nursing Home Location:  [facility] and [place] of Service: SNF (31)  PCP: Delrae Rend, NP  Allergies  Allergen Reactions  . Sulfa Antibiotics Other (See Comments)    unknown    Chief Complaint  Patient presents with  . Medical Managment of Chronic Issues    HPI:  80 year old male with PMH of advanced dementia, COPD, DM, peripheral neuropathy, chronic foley catheter due to obstructive BPH who is a LTR of [facility] now under hospice services and is being seen today for routine follow up; in the last month pt has been treated with IM rocephin for UTI and has now developed diarrhea, pt afebrile and is at baseline otherwise   Review of Systems:  Unable to perform ROS  Past Medical History  Diagnosis Date  . Unspecified hypothyroidism   . Unspecified hereditary and idiopathic peripheral neuropathy   . Chronic airway obstruction, not elsewhere classified   . Unspecified constipation   . Rash and other nonspecific skin eruption   . Pacemaker   . Hypertension   . Heart murmur   . CHF (congestive heart failure)   . History of duodenal ulcer     Archie Endo 09/06/2009  (03/11/2013)  . Renal insufficiency     Archie Endo 09/06/2009  (03/11/2013)  . Chronic atrial fibrillation     Archie Endo 09/06/2009  (03/11/2013)  . Dysphagia     Archie Endo 09/06/2009  (03/11/2013)  . Phlebitis     "I think he's had it to both legs" (03/11/2013)  . DVT (deep venous thrombosis)     LLE/notes 05/25/2010 (03/11/2013(  . Pneumonia     "a few times"/daughter (03/11/2013)  . Exertional dyspnea   . Type II or unspecified type diabetes mellitus with neurological manifestations, not stated as uncontrolled     Archie Endo 05/25/2010 (03/11/2013(  . Dementia     severe/notes 05/25/2010 (03/11/2013(  . Arthritis   . Chronic upper back pain   . Depression     "at times; maybe related to the dementia" (03/11/2013)  . UTI (urinary tract  infection)     "several at [facility]" (03/11/2013)  . Basal cell carcinoma of ear     left/notes 06/22/2008 (03/11/2013)  . Colon cancer   . Hard of hearing     "both ears" (03/11/2013)  . Stroke     "made him weaker on his left side" (03/11/2013)   Past Surgical History  Procedure Laterality Date  . Hernia repair    . Coronary artery bypass graft    . Mitral valve annuloplasty  05/2003    Archie Endo 06/22/2008 (03/11/2013)  . Tonsillectomy      Archie Endo 06/22/2008 (03/11/2013)  . Insert / replace / remove pacemaker      /notes 06/22/2008 (03/11/2013)  . Cataract extraction w/ intraocular lens  implant, bilateral      Archie Endo 06/22/2008 (03/11/2013)  . Colectomy  1995; 1996    partial/notes 06/22/2008 (03/11/2013)  . Basal cell carcinoma excision Left     ear/notes 06/22/2008 (03/11/2013)   Social History:   reports that he has never smoked. He has never used smokeless tobacco. He reports that he does not drink alcohol or use illicit drugs.  No family history on file.  Medications: Patient's Medications  New Prescriptions   No medications on file  Previous Medications   ACETAMINOPHEN (TYLENOL) 500 MG TABLET    Take  1,000 mg by mouth every 4 (four) hours as needed for pain.    DIPHENHYDRAMINE (SOMINEX) 25 MG TABLET    Take 25 mg by mouth every 6 (six) hours as needed for sleep.   DOCUSATE SODIUM (COLACE) 100 MG CAPSULE    Take 100 mg by mouth 2 (two) times daily.   GABAPENTIN (NEURONTIN) 100 MG CAPSULE    Take 100 mg by mouth 3 (three) times daily.   HYDROCODONE-ACETAMINOPHEN (NORCO/VICODIN) 5-325 MG PER TABLET    Take 1 tablet by mouth every 6 (six) hours as needed for pain.   HYDROXYZINE (ATARAX/VISTARIL) 50 MG TABLET    Take 50 mg by mouth 3 (three) times daily as needed for itching.   IPRATROPIUM-ALBUTEROL (DUONEB) 0.5-2.5 (3) MG/3ML SOLN    Take 3 mL by nebulization every 6 (six) hours as needed.   LORAZEPAM (ATIVAN) 0.5 MG TABLET    Take 0.5 mg by mouth every 8 (eight) hours.    Modified Medications   No medications on file  Discontinued Medications   No medications on file     Physical Exam: Physical Exam  Nursing note and vitals reviewed. Constitutional: He is well-developed, well-nourished, and in no distress. No distress.  HENT:  Head: Normocephalic and atraumatic.  Mouth/Throat: Oropharynx is clear and moist. No oropharyngeal exudate.  Eyes: Conjunctivae and EOM are normal. Pupils are equal, round, and reactive to light.  Neck: Normal range of motion. Neck supple.  Cardiovascular: Normal rate, regular rhythm and normal heart sounds.   Pulmonary/Chest: Effort normal and breath sounds normal. No respiratory distress.  Coarse breath sounds throughout   Abdominal: Soft. Bowel sounds are normal. He exhibits no distension. There is no tenderness.  Musculoskeletal: He exhibits no edema and no tenderness.  Neurological: He is alert.  Skin: Skin is warm and dry. He is not diaphoretic.    Filed Vitals:   10/07/13 1401  BP: 142/66  Pulse: 66  Temp: 98.3 F (36.8 C)  Resp: 22      Labs reviewed: Basic Metabolic Panel:  Recent Labs  03/19/13 0458 03/20/13 0758 03/21/13 0535  NA 149* 143 140  K 4.0 3.7 3.9  CL 117* 113* 111  CO2 20 21 23   GLUCOSE 142* 137* 133*  BUN 22 19 18   CREATININE 1.18 1.14 1.13  CALCIUM 8.1* 7.9* 7.9*   Liver Function Tests:  Recent Labs  03/11/13 1045 03/12/13 0135  AST 15 22  ALT 8 8  ALKPHOS 89 60  BILITOT 0.6 0.4  PROT 6.8 5.3*  ALBUMIN 3.0* 2.2*   No results found for this basename: LIPASE, AMYLASE,  in the last 8760 hours No results found for this basename: AMMONIA,  in the last 8760 hours CBC:  Recent Labs  03/11/13 1045  03/15/13 0500 03/20/13 0758 03/21/13 0535  WBC 1.8*  < > 9.7 8.1 8.0  NEUTROABS 1.6*  --   --   --   --   HGB 12.2*  < > 10.9* 9.8* 9.4*  HCT 37.8*  < > 34.0* 30.9* 29.4*  MCV 92.2  < > 91.6 94.2 92.7  PLT 135*  < > 126* 170 177  < > = values in this interval not  displayed. TSH, Ultrasensitive  Result: 08/22/2013 2:59 PM ( Status: F ) C  TSH 1.136 0.350-4.500 uIU/mL SLN   Assessment/Plan 1. Unspecified hypothyroidism -not on medication, TSH stable  2. Rash and other nonspecific skin eruption -resolved   3. UTI (urinary tract infection) -finished IM rocephin  4. COPD (chronic obstructive pulmonary disease) -currently on 4L [place] saturations remain stable; no signs of shortness of breath  5. Vascular dementia without behavioral disturbance -advanced; no cognitive decline in the past month  6. Diarrhea -has been ongoing for the past several days, malodorous -will get sample of cdiff, cbc with diff, bmp -encourage good PO fluid intake -will hold colace for 5 days - will extend florastore BID for a month

## 2013-11-07 ENCOUNTER — Non-Acute Institutional Stay (SKILLED_NURSING_FACILITY): Payer: Medicare Other | Admitting: Nurse Practitioner

## 2013-11-07 DIAGNOSIS — F0150 Vascular dementia without behavioral disturbance: Secondary | ICD-10-CM

## 2013-11-07 DIAGNOSIS — J449 Chronic obstructive pulmonary disease, unspecified: Secondary | ICD-10-CM

## 2013-11-07 DIAGNOSIS — G609 Hereditary and idiopathic neuropathy, unspecified: Secondary | ICD-10-CM

## 2013-11-07 DIAGNOSIS — K5900 Constipation, unspecified: Secondary | ICD-10-CM

## 2013-11-07 NOTE — Progress Notes (Signed)
Patient ID: [name], male   DOB: [birth year], 80 y.o.   MRN: [id number]    Nursing Home Location:  [facility] and [place] of Service: SNF (31)  PCP: Delrae Rend, NP  Allergies  Allergen Reactions  . Sulfa Antibiotics Other (See Comments)    unknown    Chief Complaint  Patient presents with  . Medical Managment of Chronic Issues    HPI:  80 year old male with PMH of advanced dementia, COPD, DM, peripheral neuropathy, chronic foley catheter due to obstructive BPH who is a LTR of [facility] now under hospice services and is being seen today for routine follow up; pt has been stable in the last month without any acute issues, staff without concerns today.  Labs have been done and reviewed  Review of Systems:  Unable to perform ROS   Past Medical History  Diagnosis Date  . Unspecified hypothyroidism   . Unspecified hereditary and idiopathic peripheral neuropathy   . Chronic airway obstruction, not elsewhere classified   . Unspecified constipation   . Rash and other nonspecific skin eruption   . Pacemaker   . Hypertension   . Heart murmur   . CHF (congestive heart failure)   . History of duodenal ulcer     Archie Endo 09/06/2009  (03/11/2013)  . Renal insufficiency     Archie Endo 09/06/2009  (03/11/2013)  . Chronic atrial fibrillation     Archie Endo 09/06/2009  (03/11/2013)  . Dysphagia     Archie Endo 09/06/2009  (03/11/2013)  . Phlebitis     "I think he's had it to both legs" (03/11/2013)  . DVT (deep venous thrombosis)     LLE/notes 05/25/2010 (03/11/2013(  . Pneumonia     "a few times"/daughter (03/11/2013)  . Exertional dyspnea   . Type II or unspecified type diabetes mellitus with neurological manifestations, not stated as uncontrolled     Archie Endo 05/25/2010 (03/11/2013(  . Dementia     severe/notes 05/25/2010 (03/11/2013(  . Arthritis   . Chronic upper back pain   . Depression     "at times; maybe related to the dementia" (03/11/2013)  . UTI (urinary tract  infection)     "several at [facility]" (03/11/2013)  . Basal cell carcinoma of ear     left/notes 06/22/2008 (03/11/2013)  . Colon cancer   . Hard of hearing     "both ears" (03/11/2013)  . Stroke     "made him weaker on his left side" (03/11/2013)   Past Surgical History  Procedure Laterality Date  . Hernia repair    . Coronary artery bypass graft    . Mitral valve annuloplasty  05/2003    Archie Endo 06/22/2008 (03/11/2013)  . Tonsillectomy      Archie Endo 06/22/2008 (03/11/2013)  . Insert / replace / remove pacemaker      /notes 06/22/2008 (03/11/2013)  . Cataract extraction w/ intraocular lens  implant, bilateral      Archie Endo 06/22/2008 (03/11/2013)  . Colectomy  1995; 1996    partial/notes 06/22/2008 (03/11/2013)  . Basal cell carcinoma excision Left     ear/notes 06/22/2008 (03/11/2013)   Social History:   reports that he has never smoked. He has never used smokeless tobacco. He reports that he does not drink alcohol or use illicit drugs.  No family history on file.  Medications: Patient's Medications  New Prescriptions   No medications on file  Previous Medications   ACETAMINOPHEN (TYLENOL) 500 MG TABLET    Take 1,000 mg  by mouth every 4 (four) hours as needed for pain.    DIPHENHYDRAMINE (SOMINEX) 25 MG TABLET    Take 25 mg by mouth every 6 (six) hours as needed for sleep.   DOCUSATE SODIUM (COLACE) 100 MG CAPSULE    Take 100 mg by mouth 2 (two) times daily.   GABAPENTIN (NEURONTIN) 100 MG CAPSULE    Take 100 mg by mouth 3 (three) times daily.   HYDROCODONE-ACETAMINOPHEN (NORCO/VICODIN) 5-325 MG PER TABLET    Take 1 tablet by mouth every 6 (six) hours as needed for pain.   HYDROXYZINE (ATARAX/VISTARIL) 50 MG TABLET    Take 50 mg by mouth 3 (three) times daily as needed for itching.   IPRATROPIUM-ALBUTEROL (DUONEB) 0.5-2.5 (3) MG/3ML SOLN    Take 3 mL by nebulization every 6 (six) hours as needed.  Modified Medications   No medications on file  Discontinued Medications   LORAZEPAM  (ATIVAN) 0.5 MG TABLET    Take 0.5 mg by mouth every 8 (eight) hours.     Physical Exam:  Filed Vitals:   11/07/13 1444  BP: 132/74  Pulse: 70  Temp: 97 F (36.1 C)  Resp: 20  Weight: 162 lb (73.483 kg)    Physical Exam  Nursing note and vitals reviewed. Constitutional: He is well-developed, well-nourished, and in no distress. No distress.  HENT:  Head: Normocephalic and atraumatic.  Mouth/Throat: Oropharynx is clear and moist. No oropharyngeal exudate.  Eyes: Conjunctivae and EOM are normal. Pupils are equal, round, and reactive to light.  Neck: Normal range of motion. Neck supple.  Cardiovascular: Normal rate, regular rhythm and normal heart sounds.   Pulmonary/Chest: Effort normal and breath sounds normal. No respiratory distress.  Coarse breath sounds throughout   Abdominal: Soft. Bowel sounds are normal. He exhibits no distension. There is no tenderness.  Musculoskeletal: He exhibits no edema and no tenderness.  Neurological: He is alert.  Skin: Skin is warm and dry. He is not diaphoretic.     Labs reviewed: Basic Metabolic Panel:  Recent Labs  03/19/13 0458 03/20/13 0758 03/21/13 0535  NA 149* 143 140  K 4.0 3.7 3.9  CL 117* 113* 111  CO2 _0 GLUCOSE 142* 137* 133*  BUN _1 CREATININE 1.18 1.14 1.13  CALCIUM 8.1* 7.9* 7.9*   Liver Function Tests:  Recent Labs  03/11/13 1045 03/12/13 0135  AST 15 22  ALT 8 8  ALKPHOS 89 60  BILITOT 0.6 0.4  PROT 6.8 5.3*  ALBUMIN 3.0* 2.2*   No results found for this basename: LIPASE, AMYLASE,  in the last 8760 hours No results found for this basename: AMMONIA,  in the last 8760 hours CBC:  Recent Labs  03/11/13 1045  03/15/13 0500 03/20/13 0758 03/21/13 0535  WBC 1.8*  < > 9.7 8.1 8.0  NEUTROABS 1.6*  --   --   --   --   HGB 12.2*  < > 10.9* 9.8* 9.4*  HCT 37.8*  < > 34.0* 30.9* 29.4*  MCV 92.2  < > 91.6 94.2 92.7  PLT 135*  < > 126* 170 177  < > = values in this interval not  displayed.   TSH, Ultrasensitive    Result: 08/22/2013 2:59 PM   ( Status: F )     C TSH 1.136     0.350-4.500 uIU/mL SLN CBC with Diff    Result: 10/08/2013 3:06 PM   ( Status: F )     C  WBC 5.4     4.0-10.5 K/uL SLN   RBC 3.68   L 4.22-5.81 MIL/uL SLN   Hemoglobin 10.9   L 13.0-17.0 g/dL SLN   Hematocrit 33.3   L 39.0-52.0 % SLN   MCV 90.5     78.0-100.0 fL SLN   MCH 29.6     26.0-34.0 pg SLN   MCHC 32.7     30.0-36.0 g/dL SLN   RDW 15.7   H 11.5-15.5 % SLN   Platelet Count 159     150-400 K/uL SLN   Granulocyte % 53     43-77 % SLN   Absolute Gran 2.9     1.7-7.7 K/uL SLN   Lymph % 34     12-46 % SLN   Absolute Lymph 1.8     0.7-4.0 K/uL SLN   Mono % 10     3-12 % SLN   Absolute Mono 0.5     0.1-1.0 K/uL SLN   Eos % 2     0-5 % SLN   Absolute Eos 0.1     0.0-0.7 K/uL SLN   Baso % 1     0-1 % SLN   Absolute Baso 0.0     0.0-0.1 K/uL SLN   Smear Review Criteria for review not met  SLN   Basic Metabolic Panel    Result: 10/08/2013 3:45 PM   ( Status: F )       Sodium 145     135-145 mEq/L SLN   Potassium 3.1   L 3.5-5.3 mEq/L SLN   Chloride 112     96-112 mEq/L SLN   CO2 24     19-32 mEq/L SLN   Glucose 118   H 70-99 mg/dL SLN   BUN 25   H 6-23 mg/dL SLN   Creatinine 1.13     0.50-1.35 mg/dL SLN   Calcium 8.4     8.4-10.5 mg/dL Basic Metabolic Panel    Result: 10/17/2013 1:18 PM   ( Status: F )       Sodium 145     135-145 mEq/L WML   Potassium 4.6     3.5-5.3 mEq/L WML   Chloride 113   H 96-112 mEq/L WML   CO2 23     19-32 mEq/L WML   Glucose 139   H 70-99 mg/dL WML   BUN 24   H 6-23 mg/dL WML   Creatinine 1.00     0.50-1.35 mg/dL WML   Calcium 8.0   L 8.4-10.5 mg/dL WML Assessment/Plan 1. COPD (chronic obstructive pulmonary disease) -On o2 and nebs, no signs or symptoms of worsening COPD, no recent exacerbation  2. Unspecified constipation -remains stable on colace  3. Vascular dementia without behavioral disturbance -advanced, not currently on medication- there  has been no changes in the last month  4. Unspecified hereditary and idiopathic peripheral neuropathy -stable; conts on neuronin   5. BPH with obstruction -conts with foley catheter, completed treatment for UTI last month, no further issues at this time.

## 2013-11-10 ENCOUNTER — Other Ambulatory Visit: Payer: Self-pay | Admitting: *Deleted

## 2013-11-10 MED ORDER — LORAZEPAM 0.5 MG PO TABS: ORAL_TABLET | ORAL | Status: DC

## 2013-11-10 NOTE — Telephone Encounter (Signed)
[facility] 

## 2013-12-09 ENCOUNTER — Non-Acute Institutional Stay (SKILLED_NURSING_FACILITY): Payer: Medicare Other | Admitting: Nurse Practitioner

## 2013-12-09 DIAGNOSIS — E1142 Type 2 diabetes mellitus with diabetic polyneuropathy: Secondary | ICD-10-CM

## 2013-12-09 DIAGNOSIS — D638 Anemia in other chronic diseases classified elsewhere: Secondary | ICD-10-CM

## 2013-12-09 DIAGNOSIS — J449 Chronic obstructive pulmonary disease, unspecified: Secondary | ICD-10-CM

## 2013-12-09 DIAGNOSIS — F411 Generalized anxiety disorder: Secondary | ICD-10-CM

## 2013-12-09 DIAGNOSIS — F0150 Vascular dementia without behavioral disturbance: Secondary | ICD-10-CM

## 2013-12-09 DIAGNOSIS — E1149 Type 2 diabetes mellitus with other diabetic neurological complication: Secondary | ICD-10-CM

## 2013-12-09 NOTE — Progress Notes (Signed)
Patient ID: [name], male   DOB: August 27, 1915, 80 y.o.   MRN: [id number]    Nursing Home Location:  [facility]   Place of Service: SNF (31)  PCP: Lenoard Aden, NP  Allergies  Allergen Reactions  . Sulfa Antibiotics Other (See Comments)    unknown    Chief Complaint  Patient presents with  . Medical Managment of Chronic Issues    HPI:  80 year old male with PMH of advanced dementia, COPD, DM, peripheral neuropathy, chronic foley catheter due to obstructive BPH who is a LTR of [facility] now under hospice services and is being seen today for routine follow up on chronic conditions; in the last month pt had showed signs of increased anxiety in the afternoon therefore scheduled dose of ativan was added which staff reports has helped anxiety a lot. pts o2 has also been successful weaned off. Without signs of shortness of breath, O2 sats remain stable on room air. There are no new concerns today from staff.   Review of Systems:  Review of Systems  Unable to perform ROS: dementia     Past Medical History  Diagnosis Date  . Unspecified hypothyroidism   . Unspecified hereditary and idiopathic peripheral neuropathy   . Chronic airway obstruction, not elsewhere classified   . Unspecified constipation   . Rash and other nonspecific skin eruption   . Pacemaker   . Hypertension   . Heart murmur   . CHF (congestive heart failure)   . History of duodenal ulcer     [name] 09/06/2009  (03/11/2013)  . Renal insufficiency     [name] 09/06/2009  (03/11/2013)  . Chronic atrial fibrillation     [name] 09/06/2009  (03/11/2013)  . Dysphagia     [name] 09/06/2009  (03/11/2013)  . Phlebitis     "I think he's had it to both legs" (03/11/2013)  . DVT (deep venous thrombosis)     LLE/notes 05/25/2010 (03/11/2013(  . Pneumonia     "a few times"/daughter (03/11/2013)  . Exertional dyspnea   . Type II or unspecified type diabetes mellitus with neurological manifestations, not  stated as uncontrolled     [name] 05/25/2010 (03/11/2013(  . Dementia     severe/notes 05/25/2010 (03/11/2013(  . Arthritis   . Chronic upper back pain   . Depression     "at times; maybe related to the dementia" (03/11/2013)  . UTI (urinary tract infection)     "several at [facility]" (03/11/2013)  . Basal cell carcinoma of ear     left/notes 06/22/2008 (03/11/2013)  . Colon cancer   . Hard of hearing     "both ears" (03/11/2013)  . Stroke     "made him weaker on his left side" (03/11/2013)   Past Surgical History  Procedure Laterality Date  . Hernia repair    . Coronary artery bypass graft    . Mitral valve annuloplasty  05/2003    [name] 06/22/2008 (03/11/2013)  . Tonsillectomy      [name] 06/22/2008 (03/11/2013)  . Insert / replace / remove pacemaker      /notes 06/22/2008 (03/11/2013)  . Cataract extraction w/ intraocular lens  implant, bilateral      [name] 06/22/2008 (03/11/2013)  . Colectomy  1995; 1996    partial/notes 06/22/2008 (03/11/2013)  . Basal cell carcinoma excision Left     ear/notes 06/22/2008 (03/11/2013)   Social History:   reports that he has never smoked. He has never used smokeless tobacco. He  reports that he does not drink alcohol or use illicit drugs.  No family history on file.  Medications: Patient's Medications  New Prescriptions   No medications on file  Previous Medications   ACETAMINOPHEN (TYLENOL) 500 MG TABLET    Take 1,000 mg by mouth every 4 (four) hours as needed for pain.    DIPHENHYDRAMINE (SOMINEX) 25 MG TABLET    Take 25 mg by mouth every 6 (six) hours as needed for sleep.   DOCUSATE SODIUM (COLACE) 100 MG CAPSULE    Take 100 mg by mouth 2 (two) times daily.   GABAPENTIN (NEURONTIN) 100 MG CAPSULE    Take 100 mg by mouth 3 (three) times daily.   HYDROCODONE-ACETAMINOPHEN (NORCO/VICODIN) 5-325 MG PER TABLET    Take 1 tablet by mouth every 6 (six) hours as needed for pain.   HYDROXYZINE (ATARAX/VISTARIL) 50 MG TABLET    Take 50 mg by mouth 3  (three) times daily as needed for itching.   IPRATROPIUM-ALBUTEROL (DUONEB) 0.5-2.5 (3) MG/3ML SOLN    Take 3 mL by nebulization every 6 (six) hours as needed.  Modified Medications   Modified Medication Previous Medication   LORAZEPAM (ATIVAN) 0.5 MG TABLET LORazepam (ATIVAN) 0.5 MG tablet      Take one tablet by mouth every 6 hours as needed for anxiety; and 1/2 tablet scheduled at 2pm for anxiety    Take one tablet by mouth every 6 hours as needed for anxiety  Discontinued Medications   No medications on file     Physical Exam:  Filed Vitals:   12/09/13 1435  BP: 128/70  Pulse: 64  Resp: 20  SpO2: 100%  Physical Exam  Nursing note and vitals reviewed. Constitutional: He is well-developed, well-nourished, and in no distress. No distress.  HENT:  Head: Normocephalic and atraumatic.  Mouth/Throat: Oropharynx is clear and moist. No oropharyngeal exudate.  Eyes: Conjunctivae and EOM are normal. Pupils are equal, round, and reactive to light.  Neck: Normal range of motion. Neck supple.  Cardiovascular: Normal rate, regular rhythm and normal heart sounds.   Pulmonary/Chest: Effort normal and breath sounds normal. No respiratory distress.  Unchanged Coarse breath sounds throughout   Abdominal: Soft. Bowel sounds are normal. He exhibits no distension. There is no tenderness.  Genitourinary: Penis normal.  Musculoskeletal: He exhibits no edema and no tenderness.  Neurological: He is alert.  Skin: Skin is warm and dry. He is not diaphoretic.       Labs reviewed: Basic Metabolic Panel:  Recent Labs  03/19/13 0458 03/20/13 0758 03/21/13 0535  NA 149* 143 140  K 4.0 3.7 3.9  CL 117* 113* 111  CO2 $Re'20 21 23  'PuI$ GLUCOSE 142* 137* 133*  BUN $Re'22 19 18  'bse$ CREATININE 1.18 1.14 1.13  CALCIUM 8.1* 7.9* 7.9*   Liver Function Tests:  Recent Labs  03/11/13 1045 03/12/13 0135  AST 15 22  ALT 8 8  ALKPHOS 89 60  BILITOT 0.6 0.4  PROT 6.8 5.3*  ALBUMIN 3.0* 2.2*   No results  found for this basename: LIPASE, AMYLASE,  in the last 8760 hours No results found for this basename: AMMONIA,  in the last 8760 hours CBC:  Recent Labs  03/11/13 1045  03/15/13 0500 03/20/13 0758 03/21/13 0535  WBC 1.8*  < > 9.7 8.1 8.0  NEUTROABS 1.6*  --   --   --   --   HGB 12.2*  < > 10.9* 9.8* 9.4*  HCT 37.8*  < > 34.0* 30.9*  29.4*  MCV 92.2  < > 91.6 94.2 92.7  PLT 135*  < > 126* 170 177  < > = values in this interval not displayed. TSH, Ultrasensitive  Result: 08/22/2013 2:59 PM ( Status: F ) C  TSH 1.136 0.350-4.500 uIU/mL SLN  CBC with Diff  Result: 10/08/2013 3:06 PM ( Status: F ) C  WBC 5.4 4.0-10.5 K/uL SLN  RBC 3.68 L 4.22-5.81 MIL/uL SLN  Hemoglobin 10.9 L 13.0-17.0 g/dL SLN  Hematocrit 33.3 L 39.0-52.0 % SLN  MCV 90.5 78.0-100.0 fL SLN  MCH 29.6 26.0-34.0 pg SLN  MCHC 32.7 30.0-36.0 g/dL SLN  RDW 15.7 H 11.5-15.5 % SLN  Platelet Count 159 150-400 K/uL SLN  Granulocyte % 53 43-77 % SLN  Absolute Gran 2.9 1.7-7.7 K/uL SLN  Lymph % 34 12-46 % SLN  Absolute Lymph 1.8 0.7-4.0 K/uL SLN  Mono % 10 3-12 % SLN  Absolute Mono 0.5 0.1-1.0 K/uL SLN  Eos % 2 0-5 % SLN  Absolute Eos 0.1 0.0-0.7 K/uL SLN  Baso % 1 0-1 % SLN  Absolute Baso 0.0 0.0-0.1 K/uL SLN  Smear Review Criteria for review not met SLN  Basic Metabolic Panel  Result: 3/79/4327 3:45 PM ( Status: F )  Sodium 145 135-145 mEq/L SLN  Potassium 3.1 L 3.5-5.3 mEq/L SLN  Chloride 112 96-112 mEq/L SLN  CO2 24 19-32 mEq/L SLN  Glucose 118 H 70-99 mg/dL SLN  BUN 25 H 6-23 mg/dL SLN  Creatinine 1.13 0.50-1.35 mg/dL SLN  Calcium 8.4 8.4-10.5 mg/dL  Basic Metabolic Panel  Result: 02/09/7091 1:18 PM ( Status: F )  Sodium 145 135-145 mEq/L WML  Potassium 4.6 3.5-5.3 mEq/L WML  Chloride 113 H 96-112 mEq/L WML  CO2 23 19-32 mEq/L WML  Glucose 139 H 70-99 mg/dL WML  BUN 24 H 6-23 mg/dL WML  Creatinine 1.00 0.50-1.35 mg/dL WML  Calcium 8.0 L 8.4-10.5 mg/dL WML   Assessment/Plan 1. COPD (chronic  obstructive pulmonary disease) -has maintained off all O2 and saturation has stayed above 90%  2. Vascular dementia without behavioral disturbance -stable at this time, without significant decline, doing well in current setting   3. Anemia of chronic disease -stable on last labs, will provide ongoing monitoring   4. Anxiety state, unspecified -improved with PM dose of scheduled ativan   5. DM type 2 with diabetic peripheral neuropathy -not currently on diabetic medication, neuropathy controlled on neuronin

## 2014-01-06 ENCOUNTER — Non-Acute Institutional Stay (SKILLED_NURSING_FACILITY): Payer: Medicare Other | Admitting: Nurse Practitioner

## 2014-01-06 DIAGNOSIS — N139 Obstructive and reflux uropathy, unspecified: Secondary | ICD-10-CM

## 2014-01-06 DIAGNOSIS — N138 Other obstructive and reflux uropathy: Secondary | ICD-10-CM | POA: Insufficient documentation

## 2014-01-06 DIAGNOSIS — F411 Generalized anxiety disorder: Secondary | ICD-10-CM | POA: Insufficient documentation

## 2014-01-06 DIAGNOSIS — F0150 Vascular dementia without behavioral disturbance: Secondary | ICD-10-CM

## 2014-01-06 DIAGNOSIS — N401 Enlarged prostate with lower urinary tract symptoms: Secondary | ICD-10-CM

## 2014-01-06 DIAGNOSIS — K5900 Constipation, unspecified: Secondary | ICD-10-CM

## 2014-01-06 DIAGNOSIS — J449 Chronic obstructive pulmonary disease, unspecified: Secondary | ICD-10-CM

## 2014-01-06 NOTE — Progress Notes (Signed)
Patient ID: [name], male   DOB: Jan 06, 1915, 80 y.o.   MRN: [id number]    Nursing Home Location:  [facility] and [place] of Service: SNF (31)  PCP: Delrae Rend, NP  Allergies  Allergen Reactions  . Sulfa Antibiotics Other (See Comments)    unknown    Chief Complaint  Patient presents with  . Medical Management of Chronic Issues    HPI:  80 year old male with PMH of advanced dementia, COPD, DM, peripheral neuropathy, chronic foley catheter due to obstructive BPH who is a LTR of [facility] now under hospice services and is being seen today for routine follow up on chronic conditions; there has been no significant changes in the last month. Pt conts to be followed by hospice RN.    Review of Systems:  Unable to obtain due to dementia  Past Medical History  Diagnosis Date  . Unspecified hypothyroidism   . Unspecified hereditary and idiopathic peripheral neuropathy   . Chronic airway obstruction, not elsewhere classified   . Unspecified constipation   . Rash and other nonspecific skin eruption   . Pacemaker   . Hypertension   . Heart murmur   . CHF (congestive heart failure)   . History of duodenal ulcer     Archie Endo 09/06/2009  (03/11/2013)  . Renal insufficiency     Archie Endo 09/06/2009  (03/11/2013)  . Chronic atrial fibrillation     Archie Endo 09/06/2009  (03/11/2013)  . Dysphagia     Archie Endo 09/06/2009  (03/11/2013)  . Phlebitis     "I think he's had it to both legs" (03/11/2013)  . DVT (deep venous thrombosis)     LLE/notes 05/25/2010 (03/11/2013(  . Pneumonia     "a few times"/daughter (03/11/2013)  . Exertional dyspnea   . Type II or unspecified type diabetes mellitus with neurological manifestations, not stated as uncontrolled     Archie Endo 05/25/2010 (03/11/2013(  . Dementia     severe/notes 05/25/2010 (03/11/2013(  . Arthritis   . Chronic upper back pain   . Depression     "at times; maybe related to the dementia" (03/11/2013)  . UTI (urinary tract  infection)     "several at [facility]" (03/11/2013)  . Basal cell carcinoma of ear     left/notes 06/22/2008 (03/11/2013)  . Colon cancer   . Hard of hearing     "both ears" (03/11/2013)  . Stroke     "made him weaker on his left side" (03/11/2013)   Past Surgical History  Procedure Laterality Date  . Hernia repair    . Coronary artery bypass graft    . Mitral valve annuloplasty  05/2003    Archie Endo 06/22/2008 (03/11/2013)  . Tonsillectomy      Archie Endo 06/22/2008 (03/11/2013)  . Insert / replace / remove pacemaker      /notes 06/22/2008 (03/11/2013)  . Cataract extraction w/ intraocular lens  implant, bilateral      Archie Endo 06/22/2008 (03/11/2013)  . Colectomy  1995; 1996    partial/notes 06/22/2008 (03/11/2013)  . Basal cell carcinoma excision Left     ear/notes 06/22/2008 (03/11/2013)   Social History:   reports that he has never smoked. He has never used smokeless tobacco. He reports that he does not drink alcohol or use illicit drugs.  No family history on file.  Medications: Patient's Medications  New Prescriptions   No medications on file  Previous Medications   ACETAMINOPHEN (TYLENOL) 500 MG TABLET    Take 1,000  mg by mouth every 4 (four) hours as needed for pain.    DIPHENHYDRAMINE (SOMINEX) 25 MG TABLET    Take 25 mg by mouth every 6 (six) hours as needed for sleep.   DOCUSATE SODIUM (COLACE) 100 MG CAPSULE    Take 100 mg by mouth 2 (two) times daily.   GABAPENTIN (NEURONTIN) 100 MG CAPSULE    Take 100 mg by mouth 3 (three) times daily.   HYDROCODONE-ACETAMINOPHEN (NORCO/VICODIN) 5-325 MG PER TABLET    Take 1 tablet by mouth every 6 (six) hours as needed for pain.   HYDROXYZINE (ATARAX/VISTARIL) 50 MG TABLET    Take 50 mg by mouth 3 (three) times daily as needed for itching.   IPRATROPIUM-ALBUTEROL (DUONEB) 0.5-2.5 (3) MG/3ML SOLN    Take 3 mL by nebulization every 6 (six) hours as needed.   LORAZEPAM (ATIVAN) 0.5 MG TABLET    Take one tablet by mouth every 6 hours as needed for  anxiety; and 1/2 tablet scheduled at 2pm for anxiety  Modified Medications   No medications on file  Discontinued Medications   No medications on file     Physical Exam:  Filed Vitals:   01/06/14 1555  BP: 134/77  Pulse: 76  Temp: 97 F (36.1 C)  Resp: 20    Physical Exam  Nursing note and vitals reviewed. Constitutional: No distress.  HENT:  Head: Normocephalic and atraumatic.  Eyes: Conjunctivae and EOM are normal. Pupils are equal, round, and reactive to light.  Neck: Normal range of motion. Neck supple.  Cardiovascular: Normal rate, regular rhythm and normal heart sounds.   Pulmonary/Chest: Effort normal and breath sounds normal. No respiratory distress.  Abdominal: Soft. Bowel sounds are normal. He exhibits no distension. There is no tenderness.  Genitourinary: Penis normal.  Musculoskeletal: He exhibits no edema and no tenderness.  Neurological: He is alert.  Skin: Skin is warm and dry. He is not diaphoretic.     Labs reviewed: SH, Ultrasensitive  Result: 08/22/2013 2:59 PM ( Status: F ) C  TSH 1.136 0.350-4.500 uIU/mL SLN  CBC with Diff  Result: 10/08/2013 3:06 PM ( Status: F ) C  WBC 5.4 4.0-10.5 K/uL SLN  RBC 3.68 L 4.22-5.81 MIL/uL SLN  Hemoglobin 10.9 L 13.0-17.0 g/dL SLN  Hematocrit 33.3 L 39.0-52.0 % SLN  MCV 90.5 78.0-100.0 fL SLN  MCH 29.6 26.0-34.0 pg SLN  MCHC 32.7 30.0-36.0 g/dL SLN  RDW 15.7 H 11.5-15.5 % SLN  Platelet Count 159 150-400 K/uL SLN  Granulocyte % 53 43-77 % SLN  Absolute Gran 2.9 1.7-7.7 K/uL SLN  Lymph % 34 12-46 % SLN  Absolute Lymph 1.8 0.7-4.0 K/uL SLN  Mono % 10 3-12 % SLN  Absolute Mono 0.5 0.1-1.0 K/uL SLN  Eos % 2 0-5 % SLN  Absolute Eos 0.1 0.0-0.7 K/uL SLN  Baso % 1 0-1 % SLN  Absolute Baso 0.0 0.0-0.1 K/uL SLN  Smear Review Criteria for review not met SLN  Basic Metabolic Panel  Result: 5/78/4696 3:45 PM ( Status: F )  Sodium 145 135-145 mEq/L SLN  Potassium 3.1 L 3.5-5.3 mEq/L SLN  Chloride 112 96-112 mEq/L  SLN  CO2 24 19-32 mEq/L SLN  Glucose 118 H 70-99 mg/dL SLN  BUN 25 H 6-23 mg/dL SLN  Creatinine 1.13 0.50-1.35 mg/dL SLN  Calcium 8.4 8.4-10.5 mg/dL  Basic Metabolic Panel  Result: 2/95/2841 1:18 PM ( Status: F )  Sodium 145 135-145 mEq/L WML  Potassium 4.6 3.5-5.3 mEq/L WML  Chloride 113 H 96-112  mEq/L WML  CO2 23 19-32 mEq/L WML  Glucose 139 H 70-99 mg/dL WML  BUN 24 H 6-23 mg/dL WML  Creatinine 1.00 0.50-1.35 mg/dL WML  Calcium 8.0 L 8.4-10.5 mg/dL WML  Assessment/Plan 1. Vascular dementia without behavioral disturbance -remains without significant change.  2. Unspecified constipation -without complaints at this time  3. COPD (chronic obstructive pulmonary disease) -remains stable, off O2  4. Anxiety state, unspecified -stable without sings of worsening symptoms  5. BPH -conts with chronic foley being changed monthly by nursing staff

## 2014-01-15 ENCOUNTER — Non-Acute Institutional Stay (SKILLED_NURSING_FACILITY): Payer: Medicare Other | Admitting: Internal Medicine

## 2014-01-15 ENCOUNTER — Encounter: Payer: Self-pay | Admitting: Internal Medicine

## 2014-01-15 DIAGNOSIS — J449 Chronic obstructive pulmonary disease, unspecified: Secondary | ICD-10-CM

## 2014-01-15 DIAGNOSIS — E1142 Type 2 diabetes mellitus with diabetic polyneuropathy: Secondary | ICD-10-CM

## 2014-01-15 DIAGNOSIS — E1149 Type 2 diabetes mellitus with other diabetic neurological complication: Secondary | ICD-10-CM

## 2014-01-15 DIAGNOSIS — F411 Generalized anxiety disorder: Secondary | ICD-10-CM

## 2014-01-15 NOTE — Assessment & Plan Note (Signed)
Pt on Ativan 0.5 mg q6 prn;this is adequate

## 2014-01-15 NOTE — Progress Notes (Signed)
MRN: [id number] Name: [name]  Sex: male Age: 80 y.o. DOB: Mar 05, 1915  [place] #: Helene Kelp Facility/Room: 318 Level Of Care: SNF Provider: Hennie Duos Emergency Contacts: Extended Emergency Contact Information Primary Emergency Contact: Grumbine,Earl Address: [address]          [place], [place] [address] Johnnette Litter of [place] Phone: [phone] Relation: Brother Secondary Emergency Contact: Grayland Ormond [place] of [place] Phone: [phone] Mobile Phone: [phone] Relation: Daughter  Code Status: DNR  Allergies: Sulfa antibiotics  Chief Complaint  Patient presents with  . Medical Management of Chronic Issues    HPI: Patient is 80 y.o. male who is being seen for several issues.  Past Medical History  Diagnosis Date  . Unspecified hypothyroidism   . Unspecified hereditary and idiopathic peripheral neuropathy   . Chronic airway obstruction, not elsewhere classified   . Unspecified constipation   . Rash and other nonspecific skin eruption   . Pacemaker   . Hypertension   . Heart murmur   . CHF (congestive heart failure)   . History of duodenal ulcer     Archie Endo 09/06/2009  (03/11/2013)  . Renal insufficiency     Archie Endo 09/06/2009  (03/11/2013)  . Chronic atrial fibrillation     Archie Endo 09/06/2009  (03/11/2013)  . Dysphagia     Archie Endo 09/06/2009  (03/11/2013)  . Phlebitis     "I think he's had it to both legs" (03/11/2013)  . DVT (deep venous thrombosis)     LLE/notes 05/25/2010 (03/11/2013(  . Pneumonia     "a few times"/daughter (03/11/2013)  . Exertional dyspnea   . Type II or unspecified type diabetes mellitus with neurological manifestations, not stated as uncontrolled     Archie Endo 05/25/2010 (03/11/2013(  . Dementia     severe/notes 05/25/2010 (03/11/2013(  . Arthritis   . Chronic upper back pain   . Depression     "at times; maybe related to the dementia" (03/11/2013)  . UTI (urinary tract infection)     "several at [facility]"  (03/11/2013)  . Basal cell carcinoma of ear     left/notes 06/22/2008 (03/11/2013)  . Colon cancer   . Hard of hearing     "both ears" (03/11/2013)  . Stroke     "made him weaker on his left side" (03/11/2013)    Past Surgical History  Procedure Laterality Date  . Hernia repair    . Coronary artery bypass graft    . Mitral valve annuloplasty  05/2003    Archie Endo 06/22/2008 (03/11/2013)  . Tonsillectomy      Archie Endo 06/22/2008 (03/11/2013)  . Insert / replace / remove pacemaker      /notes 06/22/2008 (03/11/2013)  . Cataract extraction w/ intraocular lens  implant, bilateral      Archie Endo 06/22/2008 (03/11/2013)  . Colectomy  1995; 1996    partial/notes 06/22/2008 (03/11/2013)  . Basal cell carcinoma excision Left     ear/notes 06/22/2008 (03/11/2013)      Medication List       This list is accurate as of: 01/15/14  7:51 PM.  Always use your most recent med list.               acetaminophen 500 MG tablet  Commonly known as:  TYLENOL  Take 1,000 mg by mouth every 4 (four) hours as needed for pain.     diphenhydrAMINE 25 MG tablet  Commonly known as:  SOMINEX  Take 25 mg by mouth every 6 (six) hours as needed  for sleep.     docusate sodium 100 MG capsule  Commonly known as:  COLACE  Take 100 mg by mouth 2 (two) times daily.     gabapentin 100 MG capsule  Commonly known as:  NEURONTIN  Take 100 mg by mouth 3 (three) times daily.     HYDROcodone-acetaminophen 5-325 MG per tablet  Commonly known as:  NORCO/VICODIN  Take 1 tablet by mouth every 6 (six) hours as needed for pain.     hydrOXYzine 50 MG tablet  Commonly known as:  ATARAX/VISTARIL  Take 50 mg by mouth 3 (three) times daily as needed for itching.     ipratropium-albuterol 0.5-2.5 (3) MG/3ML Soln  Commonly known as:  DUONEB  Take 3 mL by nebulization every 6 (six) hours as needed.     LORazepam 0.5 MG tablet  Commonly known as:  ATIVAN  Take one tablet by mouth every 6 hours as needed for anxiety; and 1/2 tablet  scheduled at 2pm for anxiety        No orders of the defined types were placed in this encounter.     There is no immunization history on file for this patient.  History  Substance Use Topics  . Smoking status: Never Smoker   . Smokeless tobacco: Never Used  . Alcohol Use: No    Review of Systems  DATA OBTAINED:  nurse GENERAL:  no fevers, fatigue, appetite changes SKIN: No itching, rash HEENT: No complaint RESPIRATORY: No cough, wheezing, SOB CARDIAC: No chest pain, palpitations, lower extremity edema  GI: No abdominal pain, No N/V/D or constipation, No heartburn or reflux  GU: No dysuria, frequency or urgency, or incontinence  MUSCULOSKELETAL: c/o B foot pain NEUROLOGIC: No headache, dizziness  PSYCHIATRIC: pt has been more agitated    Filed Vitals:   01/15/14 1626  BP: 140/74  Pulse: 84  Temp: 97.5 F (36.4 C)  Resp: 20    Physical Exam  GENERAL APPEARANCE: Alert, aphasic. No acute distress  SKIN: No diaphoresis rash HEENT: Unremarkable RESPIRATORY: Breathing is even, unlabored. Lung sounds are clear   CARDIOVASCULAR: Heart RRR no murmurs, rubs or gallops. No peripheral edema  GASTROINTESTINAL: Abdomen is soft, non-tender, not distended w/ normal bowel sounds.  GENITOURINARY: Bladder non tender, not distended  MUSCULOSKELETAL: No abnormal joints or musculature NEUROLOGIC: Cranial nerves 2-12 grossly intact PSYCHIATRIC: no behavioral issues  Patient Active Problem List   Diagnosis Date Noted  . Anxiety state, unspecified 01/06/2014  . BPH with urinary obstruction 01/06/2014  . Vascular dementia without behavioral disturbance 10/07/2013  . PNA (pneumonia) 06/18/2013  . Stroke   . Increased oropharyngeal secretions 03/21/2013  . Bacteremia 03/17/2013  . Hypernatremia 03/17/2013  . Swelling of limb 03/16/2013  . Severe sepsis with septic shock 03/14/2013  . ARF (acute renal failure) 03/14/2013  . UTI (urinary tract infection) 03/14/2013  . Acute  respiratory failure 03/14/2013  . Metabolic acidosis 36/14/4315  . Palliative care encounter 03/12/2013  . Pain in right extremity 03/12/2013  . Dyspnea 03/12/2013  . Anemia of chronic disease 01/15/2013  . Unspecified hypothyroidism   . Type II or unspecified type diabetes mellitus with neurological manifestations, not stated as uncontrolled(250.60)   . DM type 2 with diabetic peripheral neuropathy   . COPD (chronic obstructive pulmonary disease)   . Unspecified constipation   . Rash and other nonspecific skin eruption     CBC    Component Value Date/Time   WBC 8.0 03/21/2013 0535   RBC 3.17* 03/21/2013 0535  RBC 4.05* 12/09/2009 1139   HGB 9.4* 03/21/2013 0535   HCT 29.4* 03/21/2013 0535   PLT 177 03/21/2013 0535   MCV 92.7 03/21/2013 0535   LYMPHSABS 0.2* 03/11/2013 1045   MONOABS 0.0* 03/11/2013 1045   EOSABS 0.0 03/11/2013 1045   BASOSABS 0.0 03/11/2013 1045    CMP     Component Value Date/Time   NA 140 03/21/2013 0535   K 3.9 03/21/2013 0535   CL 111 03/21/2013 0535   CO2 23 03/21/2013 0535   GLUCOSE 133* 03/21/2013 0535   BUN 18 03/21/2013 0535   CREATININE 1.13 03/21/2013 0535   CALCIUM 7.9* 03/21/2013 0535   PROT 5.3* 03/12/2013 0135   ALBUMIN 2.2* 03/12/2013 0135   AST 22 03/12/2013 0135   ALT 8 03/12/2013 0135   ALKPHOS 60 03/12/2013 0135   BILITOT 0.4 03/12/2013 0135   GFRNONAA 53* 03/21/2013 0535   GFRAA 61* 03/21/2013 0535    Assessment and Plan  Anxiety state, unspecified Pt on Ativan 0.5 mg q6 prn;this is adequate  DM type 2 with diabetic peripheral neuropathy Pt has been restarted on Neurontin 100 mg TID and will now be increased to 300 mg TID for B foot pain  COPD (chronic obstructive pulmonary disease) No recent exacerbations;continue duoneb prn    Hennie Duos, MD

## 2014-01-15 NOTE — Assessment & Plan Note (Signed)
Pt has been restarted on Neurontin 100 mg TID and will now be increased to 300 mg TID for B foot pain

## 2014-01-15 NOTE — Assessment & Plan Note (Signed)
No recent exacerbations;continue duoneb prn

## 2014-02-20 ENCOUNTER — Non-Acute Institutional Stay (SKILLED_NURSING_FACILITY): Payer: Medicare Other | Admitting: Nurse Practitioner

## 2014-02-20 DIAGNOSIS — N390 Urinary tract infection, site not specified: Secondary | ICD-10-CM

## 2014-02-20 DIAGNOSIS — F0150 Vascular dementia without behavioral disturbance: Secondary | ICD-10-CM

## 2014-02-20 DIAGNOSIS — F411 Generalized anxiety disorder: Secondary | ICD-10-CM

## 2014-02-20 DIAGNOSIS — N401 Enlarged prostate with lower urinary tract symptoms: Secondary | ICD-10-CM

## 2014-02-20 DIAGNOSIS — N139 Obstructive and reflux uropathy, unspecified: Secondary | ICD-10-CM

## 2014-02-20 DIAGNOSIS — E1149 Type 2 diabetes mellitus with other diabetic neurological complication: Secondary | ICD-10-CM

## 2014-02-20 DIAGNOSIS — N138 Other obstructive and reflux uropathy: Secondary | ICD-10-CM

## 2014-02-20 DIAGNOSIS — T83511A Infection and inflammatory reaction due to indwelling urethral catheter, initial encounter: Secondary | ICD-10-CM

## 2014-02-20 NOTE — Progress Notes (Signed)
Patient ID: [name], male   DOB: October 21, 1914, 80 y.o.   MRN: [id number]    Nursing Home Location:  [facility] [place] of Service: SNF [phone]  PCP: Delrae Rend, NP  Allergies  Allergen Reactions  . Sulfa Antibiotics Other (See Comments)    unknown    Chief Complaint  Patient presents with  . Medical Management of Chronic Issues    HPI:  80 year old male with PMH of advanced dementia, COPD, DM, peripheral neuropathy, chronic foley catheter due to obstructive BPH who is a LTR of [facility] now under hospice services and is being seen today for routine follow up on chronic conditions and follow up urine culture, pt had foley changed and UAC&S sent due to cloudy urine.  Review of Systems:  Unable to obtain due to dementia  Past Medical History  Diagnosis Date  . Unspecified hypothyroidism   . Unspecified hereditary and idiopathic peripheral neuropathy   . Chronic airway obstruction, not elsewhere classified   . Unspecified constipation   . Rash and other nonspecific skin eruption   . Pacemaker   . Hypertension   . Heart murmur   . CHF (congestive heart failure)   . History of duodenal ulcer     Archie Endo 09/06/2009  (03/11/2013)  . Renal insufficiency     Archie Endo 09/06/2009  (03/11/2013)  . Chronic atrial fibrillation     Archie Endo 09/06/2009  (03/11/2013)  . Dysphagia     Archie Endo 09/06/2009  (03/11/2013)  . Phlebitis     "I think he's had it to both legs" (03/11/2013)  . DVT (deep venous thrombosis)     LLE/notes 05/25/2010 (03/11/2013(  . Pneumonia     "a few times"/daughter (03/11/2013)  . Exertional dyspnea   . Type II or unspecified type diabetes mellitus with neurological manifestations, not stated as uncontrolled     Archie Endo 05/25/2010 (03/11/2013(  . Dementia     severe/notes 05/25/2010 (03/11/2013(  . Arthritis   . Chronic upper back pain   . Depression     "at times; maybe related to the dementia" (03/11/2013)  . UTI (urinary tract infection)     "several at [facility]" (03/11/2013)  . Basal cell carcinoma of ear     left/notes 06/22/2008 (03/11/2013)  . Colon cancer   . Hard of hearing     "both ears" (03/11/2013)  . Stroke     "made him weaker on his left side" (03/11/2013)   Past Surgical History  Procedure Laterality Date  . Hernia repair    . Coronary artery bypass graft    . Mitral valve annuloplasty  05/2003    Archie Endo 06/22/2008 (03/11/2013)  . Tonsillectomy      Archie Endo 06/22/2008 (03/11/2013)  . Insert / replace / remove pacemaker      /notes 06/22/2008 (03/11/2013)  . Cataract extraction w/ intraocular lens  implant, bilateral      Archie Endo 06/22/2008 (03/11/2013)  . Colectomy  1995; 1996    partial/notes 06/22/2008 (03/11/2013)  . Basal cell carcinoma excision Left     ear/notes 06/22/2008 (03/11/2013)   Social History:   reports that he has never smoked. He has never used smokeless tobacco. He reports that he does not drink alcohol or use illicit drugs.  No family history on file.  Medications: Patient's Medications  New Prescriptions   No medications on file  Previous Medications   ACETAMINOPHEN (TYLENOL) 500 MG TABLET    Take 1,000 mg by mouth every 4 (  four) hours as needed for pain.    DIPHENHYDRAMINE (SOMINEX) 25 MG TABLET    Take 25 mg by mouth every 6 (six) hours as needed for sleep.   DOCUSATE SODIUM (COLACE) 100 MG CAPSULE    Take 100 mg by mouth 2 (two) times daily.   GABAPENTIN (NEURONTIN) 100 MG CAPSULE    Take 100 mg by mouth 3 (three) times daily.   HYDROCODONE-ACETAMINOPHEN (NORCO/VICODIN) 5-325 MG PER TABLET    Take 1 tablet by mouth every 6 (six) hours as needed for pain.   HYDROXYZINE (ATARAX/VISTARIL) 50 MG TABLET    Take 50 mg by mouth 3 (three) times daily as needed for itching.   IPRATROPIUM-ALBUTEROL (DUONEB) 0.5-2.5 (3) MG/3ML SOLN    Take 3 mL by nebulization every 6 (six) hours as needed.   LORAZEPAM (ATIVAN) 0.5 MG TABLET    Take one tablet by mouth every 6 hours as needed for anxiety; and  1/2 tablet scheduled at 2pm for anxiety  Modified Medications   No medications on file  Discontinued Medications   No medications on file     Physical Exam:  Filed Vitals:   02/20/14 1323  BP: 149/78  Pulse: 78  Temp: 97.5 F (36.4 C)  Resp: 20  SpO2: 97%   Physical Exam  Constitutional: He appears well-developed and well-nourished. No distress.  HENT:  Head: Normocephalic and atraumatic.  Mouth/Throat: Oropharynx is clear and moist.  Neck: Normal range of motion. Neck supple.  Cardiovascular: Normal rate, regular rhythm and normal heart sounds.   Pulmonary/Chest: Effort normal and breath sounds normal.  Abdominal: Soft. Bowel sounds are normal. He exhibits no distension. There is no tenderness.  Musculoskeletal: He exhibits no edema and no tenderness.  In wheelchair   Neurological: He is alert.  Skin: Skin is warm and dry. He is not diaphoretic.  Open blisters on right should and bilateral buttock, hip and thigh    Psychiatric: Cognition and memory are impaired. He exhibits abnormal recent memory and abnormal remote memory.      Labs reviewed: Basic Metabolic Panel:  Recent Labs  03/19/13 0458 03/20/13 0758 03/21/13 0535  NA 149* 143 140  K 4.0 3.7 3.9  CL 117* 113* 111  CO2 _0 GLUCOSE 142* 137* 133*  BUN _1 CREATININE 1.18 1.14 1.13  CALCIUM 8.1* 7.9* 7.9*   Liver Function Tests:  Recent Labs  03/11/13 1045 03/12/13 0135  AST 15 22  ALT 8 8  ALKPHOS 89 60  BILITOT 0.6 0.4  PROT 6.8 5.3*  ALBUMIN 3.0* 2.2*   No results found for this basename: LIPASE, AMYLASE,  in the last 8760 hours No results found for this basename: AMMONIA,  in the last 8760 hours CBC:  Recent Labs  03/11/13 1045  03/15/13 0500 03/20/13 0758 03/21/13 0535  WBC 1.8*  < > 9.7 8.1 8.0  NEUTROABS 1.6*  --   --   --   --   HGB 12.2*  < > 10.9* 9.8* 9.4*  HCT 37.8*  < > 34.0* 30.9* 29.4*  MCV 92.2  < > 91.6 94.2 92.7  PLT 135*  < > 126* 170 177  < > =  values in this interval not displayed.  Result: 08/22/2013 2:59 PM ( Status: F ) C  TSH 1.136 0.350-4.500 uIU/mL SLN  CBC with Diff  Result: 10/08/2013 3:06 PM ( Status: F ) C  WBC 5.4 4.0-10.5 K/uL SLN  RBC 3.68 L 4.22-5.81 MIL/uL SLN  Hemoglobin 10.9 L 13.0-17.0 g/dL SLN  Hematocrit 33.3 L 39.0-52.0 % SLN  MCV 90.5 78.0-100.0 fL SLN  MCH 29.6 26.0-34.0 pg SLN  MCHC 32.7 30.0-36.0 g/dL SLN  RDW 15.7 H 11.5-15.5 % SLN  Platelet Count 159 150-400 K/uL SLN  Granulocyte % 53 43-77 % SLN  Absolute Gran 2.9 1.7-7.7 K/uL SLN  Lymph % 34 12-46 % SLN  Absolute Lymph 1.8 0.7-4.0 K/uL SLN  Mono % 10 3-12 % SLN  Absolute Mono 0.5 0.1-1.0 K/uL SLN  Eos % 2 0-5 % SLN  Absolute Eos 0.1 0.0-0.7 K/uL SLN  Baso % 1 0-1 % SLN  Absolute Baso 0.0 0.0-0.1 K/uL SLN  Smear Review Criteria for review not met SLN  Basic Metabolic Panel  Result: 0/37/9444 3:45 PM ( Status: F )  Sodium 145 135-145 mEq/L SLN  Potassium 3.1 L 3.5-5.3 mEq/L SLN  Chloride 112 96-112 mEq/L SLN  CO2 24 19-32 mEq/L SLN  Glucose 118 H 70-99 mg/dL SLN  BUN 25 H 6-23 mg/dL SLN  Creatinine 1.13 0.50-1.35 mg/dL SLN  Calcium 8.4 8.4-10.5 mg/dL  Basic Metabolic Panel  Result: 02/13/121 1:18 PM ( Status: F )  Sodium 145 135-145 mEq/L WML  Potassium 4.6 3.5-5.3 mEq/L WML  Chloride 113 H 96-112 mEq/L WML  CO2 23 19-32 mEq/L WML  Glucose 139 H 70-99 mg/dL WML  BUN 24 H 6-23 mg/dL WML  Creatinine 1.00 0.50-1.35 mg/dL WML  Calcium 8.0 L 8.4-10.5 mg/dL WML  Assessment/Plan 1. Constipation Controlled on current medications   2. Anxiety state, unspecified -stable on current ativan  3. BPH with urinary obstruction -conts with foley catheter   4. Urinary tract infection associated with catheterization of urinary tract, initial encounter -had cloudy otherwise was not symptomatic, however greater than 100,000 colonies  -sensitive to rocephin will start IM rocephin 1 gm for 7 days   5. Vascular dementia without behavioral  disturbance -stable without major changes in the past month

## 2014-02-25 ENCOUNTER — Other Ambulatory Visit: Payer: Self-pay | Admitting: *Deleted

## 2014-02-25 MED ORDER — HYDROCODONE-ACETAMINOPHEN 5-325 MG PO TABS: 1.0000 | ORAL_TABLET | Freq: Four times a day (QID) | ORAL | Status: DC | PRN

## 2014-03-12 ENCOUNTER — Ambulatory Visit (INDEPENDENT_AMBULATORY_CARE_PROVIDER_SITE_OTHER): Admitting: *Deleted

## 2014-03-12 DIAGNOSIS — I482 Chronic atrial fibrillation, unspecified: Secondary | ICD-10-CM

## 2014-03-12 DIAGNOSIS — I4891 Unspecified atrial fibrillation: Secondary | ICD-10-CM

## 2014-03-12 LAB — MDC_IDC_ENUM_SESS_TYPE_INCLINIC
Brady Statistic RV Percent Paced: 72 %
Lead Channel Impedance Value: 560 Ohm
Lead Channel Pacing Threshold Amplitude: 1 V
Lead Channel Sensing Intrinsic Amplitude: 11.2 mV
Lead Channel Setting Pacing Amplitude: 2.5 V
Lead Channel Setting Pacing Pulse Width: 0.46 ms
Lead Channel Setting Sensing Sensitivity: 2.8 mV
MDC IDC MSMT BATTERY VOLTAGE: 2.57 V
MDC IDC MSMT LEADCHNL RV PACING THRESHOLD PULSEWIDTH: 0.46 ms

## 2014-03-12 NOTE — Progress Notes (Signed)
Nursing Home pacemaker check.  Device reached ERI 02/24/14.  High Ventricular threshold by auto capture, although with a recheck today the threshold was normal along with impedance and sensing.

## 2014-03-18 ENCOUNTER — Ambulatory Visit (INDEPENDENT_AMBULATORY_CARE_PROVIDER_SITE_OTHER): Admitting: Internal Medicine

## 2014-03-18 DIAGNOSIS — Z710 Person encountering health services to consult on behalf of another person: Secondary | ICD-10-CM

## 2014-03-18 NOTE — Progress Notes (Signed)
[name] received a pacemaker in 2007 for tachybradycardia syndrome. His device reached ERI June 2015.  He has been at [facility] for this 5-6 years. He suffered strokes remotely and a severe urinary tract infection 5-6 months ago. This prompted involvement of hospice who has been a support for the last 2-3 months.  His brother, the patient's POA, came in today for [place] to discuss what to do about the pacemaker area notes that his brothers situation is deteriorating. He is less able to feed himself. He is verbal but not really communicative.  After about 45 minute discussion we have elected to plan to regroup in about 4 months. Will anticipate checking his pacemaker just prior to that. The patient's daughter who has recently returned [name] will be invited joint is at that meeting.

## 2014-03-18 NOTE — Patient Instructions (Signed)
Your physician recommends that you schedule a follow-up appointment in: last week of October/first week of November

## 2014-04-03 ENCOUNTER — Encounter: Payer: Self-pay | Admitting: Nurse Practitioner

## 2014-04-03 ENCOUNTER — Non-Acute Institutional Stay (SKILLED_NURSING_FACILITY): Payer: Medicare Other | Admitting: Nurse Practitioner

## 2014-04-03 DIAGNOSIS — L98491 Non-pressure chronic ulcer of skin of other sites limited to breakdown of skin: Secondary | ICD-10-CM

## 2014-04-03 DIAGNOSIS — J4489 Other specified chronic obstructive pulmonary disease: Secondary | ICD-10-CM

## 2014-04-03 DIAGNOSIS — L98499 Non-pressure chronic ulcer of skin of other sites with unspecified severity: Secondary | ICD-10-CM

## 2014-04-03 DIAGNOSIS — F411 Generalized anxiety disorder: Secondary | ICD-10-CM

## 2014-04-03 DIAGNOSIS — D638 Anemia in other chronic diseases classified elsewhere: Secondary | ICD-10-CM

## 2014-04-03 DIAGNOSIS — F0150 Vascular dementia without behavioral disturbance: Secondary | ICD-10-CM

## 2014-04-03 DIAGNOSIS — J449 Chronic obstructive pulmonary disease, unspecified: Secondary | ICD-10-CM

## 2014-04-03 NOTE — Progress Notes (Signed)
Patient ID: [name], male   DOB: [birth year], 80 y.o.   MRN: [id number]    Nursing Home Location:  [facility] [place] of Service: SNF (31)  PCP: Delrae Rend, NP  Allergies  Allergen Reactions  . Sulfa Antibiotics Other (See Comments)    unknown    Chief Complaint  Patient presents with  . Medical Management of Chronic Issues    Routine Visit     HPI:  80 year old male with PMH of advanced dementia, COPD, DM, peripheral neuropathy, chronic foley catheter due to obstructive BPH who is a LTR of [facility] now under hospice services and is being seen today for routine follow up on chronic conditions. Pt was started on rocephin at last visit for UTI. Pt developed diarrhea as a result of antibiotic and this has now resolved.  Pt O2 sats have been stable however family wants pt to be kept on O2 for comfort so staff is doing this. Hospice Nurse reports irritation between toes.   Review of Systems:  Unable to obtain  Past Medical History  Diagnosis Date  . Unspecified hypothyroidism   . Unspecified hereditary and idiopathic peripheral neuropathy   . Chronic airway obstruction, not elsewhere classified   . Unspecified constipation   . Rash and other nonspecific skin eruption   . Pacemaker   . Hypertension   . Heart murmur   . CHF (congestive heart failure)   . History of duodenal ulcer     Archie Endo 09/06/2009  (03/11/2013)  . Renal insufficiency     Archie Endo 09/06/2009  (03/11/2013)  . Chronic atrial fibrillation     Archie Endo 09/06/2009  (03/11/2013)  . Dysphagia     Archie Endo 09/06/2009  (03/11/2013)  . Phlebitis     "I think he's had it to both legs" (03/11/2013)  . DVT (deep venous thrombosis)     LLE/notes 05/25/2010 (03/11/2013(  . Pneumonia     "a few times"/daughter (03/11/2013)  . Exertional dyspnea   . Type II or unspecified type diabetes mellitus with neurological manifestations, not stated as uncontrolled     Archie Endo 05/25/2010 (03/11/2013(  . Dementia       severe/notes 05/25/2010 (03/11/2013(  . Arthritis   . Chronic upper back pain   . Depression     "at times; maybe related to the dementia" (03/11/2013)  . UTI (urinary tract infection)     "several at [facility]" (03/11/2013)  . Basal cell carcinoma of ear     left/notes 06/22/2008 (03/11/2013)  . Colon cancer   . Hard of hearing     "both ears" (03/11/2013)  . Stroke     "made him weaker on his left side" (03/11/2013)   Past Surgical History  Procedure Laterality Date  . Hernia repair    . Coronary artery bypass graft    . Mitral valve annuloplasty  05/2003    Archie Endo 06/22/2008 (03/11/2013)  . Tonsillectomy      Archie Endo 06/22/2008 (03/11/2013)  . Insert / replace / remove pacemaker      /notes 06/22/2008 (03/11/2013)  . Cataract extraction w/ intraocular lens  implant, bilateral      Archie Endo 06/22/2008 (03/11/2013)  . Colectomy  1995; 1996    partial/notes 06/22/2008 (03/11/2013)  . Basal cell carcinoma excision Left     ear/notes 06/22/2008 (03/11/2013)   Social History:   reports that he has never smoked. He has never used smokeless tobacco. He reports that he does not drink alcohol or  use illicit drugs.  No family history on file.  Medications: Patient's Medications  New Prescriptions   No medications on file  Previous Medications   ACETAMINOPHEN (TYLENOL) 325 MG TABLET    325 mg. Give 2 by mouth every 4 hours for pain or fever   DOCUSATE (COLACE) 50 MG/5ML LIQUID    Give 5 cc by mouth twice daily (held for diarrhea)   GABAPENTIN (NEURONTIN) 100 MG CAPSULE    Take 200 mg by mouth 3 (three) times daily. For foot pain   HYDROCODONE-ACETAMINOPHEN (NORCO/VICODIN) 5-325 MG PER TABLET    Take 1 tablet by mouth every 6 (six) hours as needed.   IPRATROPIUM-ALBUTEROL (DUONEB) 0.5-2.5 (3) MG/3ML SOLN    Take 3 mLs by nebulization every 8 (eight) hours as needed (For wheezing, cough, or SOB).    LORAZEPAM (ATIVAN) 0.5 MG TABLET    Take one tablet by mouth every 6 hours as needed for  anxiety; and 1/2 tablet scheduled at 2pm for anxiety  Modified Medications   No medications on file  Discontinued Medications   ACETAMINOPHEN (TYLENOL) 500 MG TABLET    Take 1,000 mg by mouth every 4 (four) hours as needed for pain.    DIPHENHYDRAMINE (SOMINEX) 25 MG TABLET    Take 25 mg by mouth every 6 (six) hours as needed for sleep.   DOCUSATE SODIUM (COLACE) 100 MG CAPSULE    Take 100 mg by mouth 2 (two) times daily.   HYDROXYZINE (ATARAX/VISTARIL) 50 MG TABLET    Take 50 mg by mouth 3 (three) times daily as needed for itching.     Physical Exam:  Filed Vitals:   04/03/14 1035  BP: 141/69  Pulse: 70  Resp: 18  Weight: 162 lb (73.483 kg)    Physical Exam  Constitutional: He is well-developed, well-nourished, and in no distress.  HENT:  Mouth/Throat: Oropharynx is clear and moist. No oropharyngeal exudate.  Eyes: Conjunctivae and EOM are normal. Pupils are equal, round, and reactive to light.  Neck: Normal range of motion. Neck supple.  Cardiovascular: Normal rate, regular rhythm and normal heart sounds.   Pulmonary/Chest: Effort normal and breath sounds normal.  Abdominal: Soft. Bowel sounds are normal. He exhibits no distension.  Musculoskeletal: He exhibits no edema and no tenderness.  Neurological: He is alert.  Skin: Skin is warm and dry.  sm ulcer between 4th and 5th toe, no drainage noted    Assessment/Plan  1. Chronic obstructive pulmonary disease, unspecified COPD, unspecified chronic bronchitis type -stable, uses O2 for comfort  2. Vascular dementia without behavioral disturbance Advanced, requires staff for all ADLs, eating well. Conts to be followed by hospice 3. Anxiety state, unspecified Doing well on pm dose of ativan 4. Anemia of chronic disease Will follow up cbc and bmp 5. Skin ulcer, limited to breakdown of skin -will have staff apply xeroform gauze daily. Treatment nurse to follow

## 2014-05-11 ENCOUNTER — Encounter: Payer: Self-pay | Admitting: Internal Medicine

## 2014-05-11 ENCOUNTER — Non-Acute Institutional Stay (SKILLED_NURSING_FACILITY): Payer: Medicare Other | Admitting: Internal Medicine

## 2014-05-11 DIAGNOSIS — J449 Chronic obstructive pulmonary disease, unspecified: Secondary | ICD-10-CM

## 2014-05-11 DIAGNOSIS — E1149 Type 2 diabetes mellitus with other diabetic neurological complication: Secondary | ICD-10-CM

## 2014-05-11 DIAGNOSIS — F0150 Vascular dementia without behavioral disturbance: Secondary | ICD-10-CM

## 2014-05-11 DIAGNOSIS — N401 Enlarged prostate with lower urinary tract symptoms: Secondary | ICD-10-CM

## 2014-05-11 DIAGNOSIS — N138 Other obstructive and reflux uropathy: Secondary | ICD-10-CM

## 2014-05-11 DIAGNOSIS — E1142 Type 2 diabetes mellitus with diabetic polyneuropathy: Secondary | ICD-10-CM

## 2014-05-11 DIAGNOSIS — D638 Anemia in other chronic diseases classified elsewhere: Secondary | ICD-10-CM

## 2014-05-11 DIAGNOSIS — E870 Hyperosmolality and hypernatremia: Secondary | ICD-10-CM

## 2014-05-11 DIAGNOSIS — I6350 Cerebral infarction due to unspecified occlusion or stenosis of unspecified cerebral artery: Secondary | ICD-10-CM

## 2014-05-11 DIAGNOSIS — N139 Obstructive and reflux uropathy, unspecified: Secondary | ICD-10-CM

## 2014-05-11 DIAGNOSIS — I639 Cerebral infarction, unspecified: Secondary | ICD-10-CM

## 2014-05-11 NOTE — Assessment & Plan Note (Signed)
NA 04/07/2014 was 138

## 2014-05-11 NOTE — Assessment & Plan Note (Signed)
Continue neurontin 

## 2014-05-11 NOTE — Assessment & Plan Note (Signed)
No changes or problems

## 2014-05-11 NOTE — Progress Notes (Signed)
MRN: [id number] Name: [name]  Sex: male Age: 80 y.o. DOB: [birth year]  [place] #: Helene Kelp Facility/Room: 318 Level Of Care: SNF Provider: Inocencio Homes D Emergency Contacts: Extended Emergency Contact Information Primary Emergency Contact: Barse,Earl Address: [address]          [place], [place] [address] Johnnette Litter of [place] Phone: [phone] Relation: Brother Secondary Emergency Contact: Grayland O[place] Phone: [phone] Mobile Phone: [phone] Relation: Daughter  Code Status: DNR  Allergies: Sulfa antibiotics  Chief Complaint  Patient presents with  . Medical Management of Chronic Issues    HPI: Patient is 80 y.o. male who is being seen for routine issues.  Past Medical History  Diagnosis Date  . Unspecified hypothyroidism   . Unspecified hereditary and idiopathic peripheral neuropathy   . Chronic airway obstruction, not elsewhere classified   . Unspecified constipation   . Rash and other nonspecific skin eruption   . Pacemaker   . Hypertension   . Heart murmur   . CHF (congestive heart failure)   . History of duodenal ulcer     Archie Endo 09/06/2009  (03/11/2013)  . Renal insufficiency     Archie Endo 09/06/2009  (03/11/2013)  . Chronic atrial fibrillation     Archie Endo 09/06/2009  (03/11/2013)  . Dysphagia     Archie Endo 09/06/2009  (03/11/2013)  . Phlebitis     "I think he's had it to both legs" (03/11/2013)  . DVT (deep venous thrombosis)     LLE/notes 05/25/2010 (03/11/2013(  . Pneumonia     "a few times"/daughter (03/11/2013)  . Exertional dyspnea   . Type II or unspecified type diabetes mellitus with neurological manifestations, not stated as uncontrolled     Archie Endo 05/25/2010 (03/11/2013(  . Dementia     severe/notes 05/25/2010 (03/11/2013(  . Arthritis   . Chronic upper back pain   . Depression     "at times; maybe related to the dementia" (03/11/2013)  . UTI (urinary tract infection)     "several at [facility]"  (03/11/2013)  . Basal cell carcinoma of ear     left/notes 06/22/2008 (03/11/2013)  . Colon cancer   . Hard of hearing     "both ears" (03/11/2013)  . Stroke     "made him weaker on his left side" (03/11/2013)    Past Surgical History  Procedure Laterality Date  . Hernia repair    . Coronary artery bypass graft    . Mitral valve annuloplasty  05/2003    Archie Endo 06/22/2008 (03/11/2013)  . Tonsillectomy      Archie Endo 06/22/2008 (03/11/2013)  . Insert / replace / remove pacemaker      /notes 06/22/2008 (03/11/2013)  . Cataract extraction w/ intraocular lens  implant, bilateral      Archie Endo 06/22/2008 (03/11/2013)  . Colectomy  1995; 1996    partial/notes 06/22/2008 (03/11/2013)  . Basal cell carcinoma excision Left     ear/notes 06/22/2008 (03/11/2013)      Medication List       This list is accurate as of: 05/11/14 12:50 PM.  Always use your most recent med list.               acetaminophen 325 MG tablet  Commonly known as:  TYLENOL  325 mg. Give 2 by mouth every 4 hours for pain or fever     docusate 50 MG/5ML liquid  Commonly known as:  COLACE  Give 5 cc by mouth twice daily (held for diarrhea)  gabapentin 100 MG capsule  Commonly known as:  NEURONTIN  Take 200 mg by mouth 3 (three) times daily. For foot pain     HYDROcodone-acetaminophen 5-325 MG per tablet  Commonly known as:  NORCO/VICODIN  Take 1 tablet by mouth every 6 (six) hours as needed.     ipratropium-albuterol 0.5-2.5 (3) MG/3ML Soln  Commonly known as:  DUONEB  Take 3 mLs by nebulization every 8 (eight) hours as needed (For wheezing, cough, or SOB).     LORazepam 0.5 MG tablet  Commonly known as:  ATIVAN  Take one tablet by mouth every 6 hours as needed for anxiety; and 1/2 tablet scheduled at 2pm for anxiety        No orders of the defined types were placed in this encounter.     There is no immunization history on file for this patient.  History  Substance Use Topics  . Smoking status: Never  Smoker   . Smokeless tobacco: Never Used  . Alcohol Use: No    Review of Systems  DATA OBTAINED: from  family member GENERAL: Feels well no fevers, fatigue, appetite changes SKIN: has draining area between R toe 4 and 5 HEENT: No complaint RESPIRATORY: No cough, wheezing, SOB CARDIAC: No chest pain, palpitations, lower extremity edema  GI: No abdominal pain, No N/V/D or constipation, No heartburn or reflux  GU: No dysuria, frequency or urgency, or incontinence  MUSCULOSKELETAL: No unrelieved bone/joint pain NEUROLOGIC: No headache, dizziness or focal weakness PSYCHIATRIC: No overt anxiety or sadness. Sleeps well.   Filed Vitals:   05/11/14 1235  BP: 134/85  Pulse: 75  Temp: 99.5 F (37.5 C)  Resp: 20    Physical Exam  GENERAL APPEARANCE: Alert, min conversant. Appropriately groomed. No acute distres; being fed lunch by sisters  SKIN: No diaphoresis rash; minimal agbrSION BETWEEN TOE 4 andD 5 WITH A SPOT OF BLOOD. NO REDNESS, NO OTHER DRainaGE HEENT: Unremarkable RESPIRATORY: Breathing is even, unlabored. Lung sounds are coarse with goodAF   CARDIOVASCULAR: Heart RRR no murmurs, rubs or gallops. No peripheral edema  GASTROINTESTINAL: Abdomen is soft, non-tender, not distended w/ normal bowel sounds.  GENITOURINARY: Bladder non tender, not distended  MUSCULOSKELETAL: No abnormal joints or musculature NEUROLOGIC: Cranial nerves 2-12 grossly intact. Moves all extremities no tremor. PSYCHIATRIC:dementia, no behavioral issues  Patient Active Problem List   Diagnosis Date Noted  . Anxiety state, unspecified 01/06/2014  . BPH with urinary obstruction 01/06/2014  . Vascular dementia without behavioral disturbance 10/07/2013  . PNA (pneumonia) 06/18/2013  . Stroke   . Increased oropharyngeal secretions 03/21/2013  . Bacteremia 03/17/2013  . Hypernatremia 03/17/2013  . Swelling of limb 03/16/2013  . Severe sepsis with septic shock 03/14/2013  . ARF (acute renal failure)  03/14/2013  . UTI (urinary tract infection) 03/14/2013  . Acute respiratory failure 03/14/2013  . Metabolic acidosis 41/66/0630  . Palliative care encounter 03/12/2013  . Pain in right extremity 03/12/2013  . Dyspnea 03/12/2013  . Anemia of chronic disease 01/15/2013  . Unspecified hypothyroidism   . Type II or unspecified type diabetes mellitus with neurological manifestations, not stated as uncontrolled(250.60)   . DM type 2 with diabetic peripheral neuropathy   . COPD (chronic obstructive pulmonary disease)   . Unspecified constipation   . Rash and other nonspecific skin eruption     CBC    Component Value Date/Time   WBC 8.0 03/21/2013 0535   RBC 3.17* 03/21/2013 0535   RBC 4.05* 12/09/2009 1139   HGB  9.4* 03/21/2013 0535   HCT 29.4* 03/21/2013 0535   PLT 177 03/21/2013 0535   MCV 92.7 03/21/2013 0535   LYMPHSABS 0.2* 03/11/2013 1045   MONOABS 0.0* 03/11/2013 1045   EOSABS 0.0 03/11/2013 1045   BASOSABS 0.0 03/11/2013 1045    CMP     Component Value Date/Time   NA 140 03/21/2013 0535   K 3.9 03/21/2013 0535   CL 111 03/21/2013 0535   CO2 23 03/21/2013 0535   GLUCOSE 133* 03/21/2013 0535   BUN 18 03/21/2013 0535   CREATININE 1.13 03/21/2013 0535   CALCIUM 7.9* 03/21/2013 0535   PROT 5.3* 03/12/2013 0135   ALBUMIN 2.2* 03/12/2013 0135   AST 22 03/12/2013 0135   ALT 8 03/12/2013 0135   ALKPHOS 60 03/12/2013 0135   BILITOT 0.4 03/12/2013 0135   GFRNONAA 53* 03/21/2013 0535   GFRAA 61* 03/21/2013 0535    Assessment and Plan  Stroke No change or exacerbation  COPD (chronic obstructive pulmonary disease) No exacerbations;cont duoneb  Type II or unspecified type diabetes mellitus with neurological manifestations, not stated as uncontrolled(250.60) On no meds ;last BS on labs was 112.  DM type 2 with diabetic peripheral neuropathy Continue neurontin  Vascular dementia without behavioral disturbance No changes or problems  BPH with urinary obstruction Last UTI 01/2014  Anemia  of chronic disease 04/07/2014 Hb 10/HCT 30.3  Hypernatremia NA 04/07/2014 was 138    Hennie Duos, MD

## 2014-05-11 NOTE — Assessment & Plan Note (Signed)
04/07/2014 Hb 10/HCT 30.3

## 2014-05-11 NOTE — Assessment & Plan Note (Signed)
On no meds ;last BS on labs was 112.

## 2014-05-11 NOTE — Assessment & Plan Note (Signed)
No exacerbations;cont duoneb

## 2014-05-11 NOTE — Assessment & Plan Note (Signed)
Last UTI 01/2014

## 2014-05-11 NOTE — Assessment & Plan Note (Signed)
No change or exacerbation

## 2014-06-05 ENCOUNTER — Non-Acute Institutional Stay (SKILLED_NURSING_FACILITY): Payer: Medicare Other | Admitting: Nurse Practitioner

## 2014-06-05 DIAGNOSIS — E039 Hypothyroidism, unspecified: Secondary | ICD-10-CM

## 2014-06-05 DIAGNOSIS — J449 Chronic obstructive pulmonary disease, unspecified: Secondary | ICD-10-CM

## 2014-06-05 DIAGNOSIS — F411 Generalized anxiety disorder: Secondary | ICD-10-CM

## 2014-06-05 DIAGNOSIS — F0150 Vascular dementia without behavioral disturbance: Secondary | ICD-10-CM

## 2014-06-05 DIAGNOSIS — N401 Enlarged prostate with lower urinary tract symptoms: Secondary | ICD-10-CM

## 2014-06-05 DIAGNOSIS — N138 Other obstructive and reflux uropathy: Secondary | ICD-10-CM

## 2014-06-05 NOTE — Progress Notes (Signed)
Patient ID: [name], male   DOB: May 18, 1915, 80 y.o.   MRN: [id number]    Nursing Home Location:  [facility]   Place of Service: SNF (31)  PCP: Lauree Chandler, NP  Allergies  Allergen Reactions  . Sulfa Antibiotics Other (See Comments)    unknown    Chief Complaint  Patient presents with  . Medical Management of Chronic Issues    HPI:  80 year old male with PMH of advanced dementia, COPD, DM, peripheral neuropathy, chronic foley catheter due to obstructive BPH who is a LTR of [facility] now under hospice services and is being seen today for routine follow up on chronic conditions. Ongoing monitoring for slow healing toe ulcers. Followed by wound care. Otherwise pt at baseline without new concerns.   Review of Systems:  Unable to obtain  Past Medical History  Diagnosis Date  . Unspecified hypothyroidism   . Unspecified hereditary and idiopathic peripheral neuropathy   . Chronic airway obstruction, not elsewhere classified   . Unspecified constipation   . Rash and other nonspecific skin eruption   . Pacemaker   . Hypertension   . Heart murmur   . CHF (congestive heart failure)   . History of duodenal ulcer     Archie Endo 09/06/2009  (03/11/2013)  . Renal insufficiency     Archie Endo 09/06/2009  (03/11/2013)  . Chronic atrial fibrillation     Archie Endo 09/06/2009  (03/11/2013)  . Dysphagia     Archie Endo 09/06/2009  (03/11/2013)  . Phlebitis     "I think he's had it to both legs" (03/11/2013)  . DVT (deep venous thrombosis)     LLE/notes 05/25/2010 (03/11/2013(  . Pneumonia     "a few times"/daughter (03/11/2013)  . Exertional dyspnea   . Type II or unspecified type diabetes mellitus with neurological manifestations, not stated as uncontrolled     Archie Endo 05/25/2010 (03/11/2013(  . Dementia     severe/notes 05/25/2010 (03/11/2013(  . Arthritis   . Chronic upper back pain   . Depression     "at times; maybe related to the dementia" (03/11/2013)  . UTI (urinary tract  infection)     "several at [facility]" (03/11/2013)  . Basal cell carcinoma of ear     left/notes 06/22/2008 (03/11/2013)  . Colon cancer   . Hard of hearing     "both ears" (03/11/2013)  . Stroke     "made him weaker on his left side" (03/11/2013)   Past Surgical History  Procedure Laterality Date  . Hernia repair    . Coronary artery bypass graft    . Mitral valve annuloplasty  05/2003    Archie Endo 06/22/2008 (03/11/2013)  . Tonsillectomy      Archie Endo 06/22/2008 (03/11/2013)  . Insert / replace / remove pacemaker      /notes 06/22/2008 (03/11/2013)  . Cataract extraction w/ intraocular lens  implant, bilateral      Archie Endo 06/22/2008 (03/11/2013)  . Colectomy  1995; 1996    partial/notes 06/22/2008 (03/11/2013)  . Basal cell carcinoma excision Left     ear/notes 06/22/2008 (03/11/2013)   Social History:   reports that he has never smoked. He has never used smokeless tobacco. He reports that he does not drink alcohol or use illicit drugs.  No family history on file.  Medications: Patient's Medications  New Prescriptions   No medications on file  Previous Medications   ACETAMINOPHEN (TYLENOL) 325 MG TABLET    325 mg. Give 2 by mouth  every 4 hours for pain or fever   DOCUSATE (COLACE) 50 MG/5ML LIQUID    Give 5 cc by mouth twice daily (held for diarrhea)   GABAPENTIN (NEURONTIN) 100 MG CAPSULE    Take 200 mg by mouth 3 (three) times daily. For foot pain   HYDROCODONE-ACETAMINOPHEN (NORCO/VICODIN) 5-325 MG PER TABLET    Take 1 tablet by mouth every 6 (six) hours as needed.   IPRATROPIUM-ALBUTEROL (DUONEB) 0.5-2.5 (3) MG/3ML SOLN    Take 3 mLs by nebulization every 8 (eight) hours as needed (For wheezing, cough, or SOB).    LORAZEPAM (ATIVAN) 0.5 MG TABLET    Take one tablet by mouth every 6 hours as needed for anxiety; and 1/2 tablet scheduled at 2pm for anxiety  Modified Medications   No medications on file  Discontinued Medications   No medications on file     Physical  Exam:  Filed Vitals:   06/05/14 1556  BP: 150/56  Pulse: 66  Temp: 97 F (36.1 C)  Resp: 16  Weight: 162 lb (73.483 kg)    Physical Exam  Constitutional: He is well-developed, well-nourished, and in no distress.  HENT:  Mouth/Throat: Oropharynx is clear and moist. No oropharyngeal exudate.  Eyes: Conjunctivae and EOM are normal. Pupils are equal, round, and reactive to light.  Neck: Normal range of motion. Neck supple.  Cardiovascular: Normal rate, regular rhythm and normal heart sounds.   Pulmonary/Chest: Effort normal and breath sounds normal.  Abdominal: Soft. Bowel sounds are normal. He exhibits no distension.  Musculoskeletal: He exhibits no edema and no tenderness.  Neurological: He is alert.  Skin: Skin is warm and dry.  chronic ulcer between 4th and 5th toe followed by wound care   CBC with Diff    Result: 04/10/2014 6:50 PM   ( Status: F )       WBC 5.1     4.0-10.5 K/uL SLN   RBC 3.37   L 4.22-5.81 MIL/uL SLN   Hemoglobin 10.0   L 13.0-17.0 g/dL SLN   Hematocrit 30.3   L 39.0-52.0 % SLN   MCV 89.9     78.0-100.0 fL SLN   MCH 29.7     26.0-34.0 pg SLN   MCHC 33.0     30.0-36.0 g/dL SLN   RDW 15.9   H 11.5-15.5 % SLN   Platelet Count 142   L 150-400 K/uL SLN   Granulocyte % 51     43-77 % SLN   Absolute Gran 2.6     1.7-7.7 K/uL SLN   Lymph % 39     12-46 % SLN   Absolute Lymph 2.0     0.7-4.0 K/uL SLN   Mono % 8     3-12 % SLN   Absolute Mono 0.4     0.1-1.0 K/uL SLN   Eos % 2     0-5 % SLN   Absolute Eos 0.1     0.0-0.7 K/uL SLN   Baso % 0     0-1 % SLN   Absolute Baso 0.0     0.0-0.1 K/uL SLN   Smear Review Criteria for review not met  SLN   Basic Metabolic Panel    Result: 04/10/2014 3:58 PM   ( Status: F )       Sodium 138     135-145 mEq/L SLN   Potassium 5.5   H 3.5-5.3 mEq/L SLN C Chloride 113   H 96-112 mEq/L SLN   CO2 21  19-32 mEq/L SLN   Glucose 112   H 70-99 mg/dL SLN   BUN 42   H 6-23 mg/dL SLN   Creatinine 1.09     0.50-1.35 mg/dL SLN    Calcium 8.4   Assessment/Plan  1. Chronic obstructive pulmonary disease, unspecified COPD, unspecified chronic bronchitis type conts to wear O2 PRN comfort  2. Hyperkalemia On last lab, will follow up bmp  3. Vascular dementia without behavioral disturbance -stable, conts to be followed by hospice due to ongoing decline  4. BPH with urinary obstruction -conts with foley cath  5. Anxiety state -improved on scheduled ativan

## 2014-06-10 ENCOUNTER — Other Ambulatory Visit: Payer: Self-pay | Admitting: *Deleted

## 2014-06-10 MED ORDER — LORAZEPAM 0.5 MG PO TABS: ORAL_TABLET | ORAL | Status: DC

## 2014-06-10 NOTE — Telephone Encounter (Signed)
[facility] 

## 2014-06-11 ENCOUNTER — Other Ambulatory Visit: Payer: Self-pay | Admitting: *Deleted

## 2014-06-11 ENCOUNTER — Non-Acute Institutional Stay (SKILLED_NURSING_FACILITY): Payer: Medicare Other | Admitting: Internal Medicine

## 2014-06-11 ENCOUNTER — Encounter: Payer: Self-pay | Admitting: Internal Medicine

## 2014-06-11 DIAGNOSIS — J189 Pneumonia, unspecified organism: Secondary | ICD-10-CM

## 2014-06-11 DIAGNOSIS — J181 Lobar pneumonia, unspecified organism: Principal | ICD-10-CM

## 2014-06-11 MED ORDER — LORAZEPAM 0.5 MG PO TABS: ORAL_TABLET | ORAL | Status: DC

## 2014-06-11 MED ORDER — LORAZEPAM 0.5 MG PO TABS: ORAL_TABLET | ORAL | Status: AC

## 2014-06-11 NOTE — Telephone Encounter (Signed)
[facility] 

## 2014-06-11 NOTE — Progress Notes (Signed)
MRN: [id number] Name: [name]  Sex: male Age: 80 y.o. DOB: 11-01-1914  [place] #: [facility] Facility/Room: 318 Level Of Care: SNF Provider: Inocencio Homes D Emergency Contacts: Extended Emergency Contact Information Primary Emergency Contact: Tutterow,Earl Address: [address]          [place], [place] [address] Johnnette Litter of [place] Phone: [phone] Relation: Brother Secondary Emergency Contact: Grayland Ormond [place] Phone: [phone] Mobile Phone: [phone] Relation: Daughter  Code Status:DNR   Allergies: Sulfa antibiotics  Chief Complaint  Patient presents with  . Acute Visit    HPI: Patient is 80 y.o. male who is being seen for possible aspiration PNA. Hospice says pt's daughter gives him thin liquids all the time and he has has aspiration PNA multiple times.  Past Medical History  Diagnosis Date  . Unspecified hypothyroidism   . Unspecified hereditary and idiopathic peripheral neuropathy   . Chronic airway obstruction, not elsewhere classified   . Unspecified constipation   . Rash and other nonspecific skin eruption   . Pacemaker   . Hypertension   . Heart murmur   . CHF (congestive heart failure)   . History of duodenal ulcer     Archie Endo 09/06/2009  (03/11/2013)  . Renal insufficiency     Archie Endo 09/06/2009  (03/11/2013)  . Chronic atrial fibrillation     Archie Endo 09/06/2009  (03/11/2013)  . Dysphagia     Archie Endo 09/06/2009  (03/11/2013)  . Phlebitis     "I think he's had it to both legs" (03/11/2013)  . DVT (deep venous thrombosis)     LLE/notes 05/25/2010 (03/11/2013(  . Pneumonia     "a few times"/daughter (03/11/2013)  . Exertional dyspnea   . Type II or unspecified type diabetes mellitus with neurological manifestations, not stated as uncontrolled     Archie Endo 05/25/2010 (03/11/2013(  . Dementia     severe/notes 05/25/2010 (03/11/2013(  . Arthritis   . Chronic upper back pain   . Depression     "at times; maybe related to  the dementia" (03/11/2013)  . UTI (urinary tract infection)     "several at [facility]" (03/11/2013)  . Basal cell carcinoma of ear     left/notes 06/22/2008 (03/11/2013)  . Colon cancer   . Hard of hearing     "both ears" (03/11/2013)  . Stroke     "made him weaker on his left side" (03/11/2013)    Past Surgical History  Procedure Laterality Date  . Hernia repair    . Coronary artery bypass graft    . Mitral valve annuloplasty  05/2003    Archie Endo 06/22/2008 (03/11/2013)  . Tonsillectomy      Archie Endo 06/22/2008 (03/11/2013)  . Insert / replace / remove pacemaker      /notes 06/22/2008 (03/11/2013)  . Cataract extraction w/ intraocular lens  implant, bilateral      Archie Endo 06/22/2008 (03/11/2013)  . Colectomy  1995; 1996    partial/notes 06/22/2008 (03/11/2013)  . Basal cell carcinoma excision Left     ear/notes 06/22/2008 (03/11/2013)      Medication List       This list is accurate as of: 06/11/14  8:07 PM.  Always use your most recent med list.               acetaminophen 325 MG tablet  Commonly known as:  TYLENOL  325 mg. Give 2 by mouth every 4 hours for pain or fever     docusate 50 MG/5ML liquid  Commonly known as:  COLACE  Give 5 cc by mouth twice daily (held for diarrhea)     gabapentin 100 MG capsule  Commonly known as:  NEURONTIN  Take 200 mg by mouth 3 (three) times daily. For foot pain     HYDROcodone-acetaminophen 5-325 MG per tablet  Commonly known as:  NORCO/VICODIN  Take 1 tablet by mouth every 6 (six) hours as needed.     ipratropium-albuterol 0.5-2.5 (3) MG/3ML Soln  Commonly known as:  DUONEB  Take 3 mLs by nebulization every 8 (eight) hours as needed (For wheezing, cough, or SOB).     LORazepam 0.5 MG tablet  Commonly known as:  ATIVAN  Take one tablet by mouth every 6 hours as needed for anxiety        No orders of the defined types were placed in this encounter.     There is no immunization history on file for this patient.  History   Substance Use Topics  . Smoking status: Never Smoker   . Smokeless tobacco: Never Used  . Alcohol Use: No    Review of Systems  DATA OBTAINED: from nurse GENERAL:  no fevers, fatigue, appetite changes;doesn't think he is acting differently SKIN: No itching, rash HEENT: No complaint RESPIRATORY: No cough,+ wheezing per wound nurse CARDIAC: No chest pain, palpitations, lower extremity edema  GI: No abdominal pain, No N/V/D or constipation, No heartburn or reflux  GU: No dysuria, frequency or urgency, or incontinence  MUSCULOSKELETAL: No unrelieved bone/joint pain NEUROLOGIC: No headache, dizziness  PSYCHIATRIC: No overt anxiety or sadness  Filed Vitals:   06/11/14 1538  BP: 146/88  Pulse: 73  Temp: 97.6 F (36.4 C)  Resp: 19    Physical Exam  GENERAL APPEARANCE: Alert, nonconversant, No acute distress  SKIN: No diaphoresis rash, wounds dressed HEENT: Unremarkable RESPIRATORY: Breathing is even, unlabored. Lung sounds are diffusely decreased ;? squeak on l  CARDIOVASCULAR: Heart RRR no murmurs, rubs or gallops. No peripheral edema  GASTROINTESTINAL: Abdomen is soft, non-tender, not distended w/ normal bowel sounds.  GENITOURINARY: Bladder non tender, not distended  MUSCULOSKELETAL: No abnormal joints or musculature NEUROLOGIC: Cranial nerves 2-12 grossly intact; floats PSYCHIATRIC: dementia, no behavioral issues  Patient Active Problem List   Diagnosis Date Noted  . Anxiety state 01/06/2014  . BPH with urinary obstruction 01/06/2014  . Vascular dementia without behavioral disturbance 10/07/2013  . PNA (pneumonia) 06/18/2013  . Stroke   . Increased oropharyngeal secretions 03/21/2013  . Bacteremia 03/17/2013  . Hypernatremia 03/17/2013  . Swelling of limb 03/16/2013  . Severe sepsis with septic shock 03/14/2013  . ARF (acute renal failure) 03/14/2013  . UTI (urinary tract infection) 03/14/2013  . Acute respiratory failure 03/14/2013  . Metabolic acidosis  57/32/2025  . Palliative care encounter 03/12/2013  . Pain in right extremity 03/12/2013  . Dyspnea 03/12/2013  . Anemia of chronic disease 01/15/2013  . Hypothyroidism   . Type II or unspecified type diabetes mellitus with neurological manifestations, not stated as uncontrolled(250.60)   . DM type 2 with diabetic peripheral neuropathy   . COPD (chronic obstructive pulmonary disease)   . Unspecified constipation   . Rash and other nonspecific skin eruption     CBC    Component Value Date/Time   WBC 8.0 03/21/2013 0535   RBC 3.17* 03/21/2013 0535   RBC 4.05* 12/09/2009 1139   HGB 9.4* 03/21/2013 0535   HCT 29.4* 03/21/2013 0535   PLT 177 03/21/2013 0535   MCV 92.7 03/21/2013 0535  LYMPHSABS 0.2* 03/11/2013 1045   MONOABS 0.0* 03/11/2013 1045   EOSABS 0.0 03/11/2013 1045   BASOSABS 0.0 03/11/2013 1045    CMP     Component Value Date/Time   NA 140 03/21/2013 0535   K 3.9 03/21/2013 0535   CL 111 03/21/2013 0535   CO2 23 03/21/2013 0535   GLUCOSE 133* 03/21/2013 0535   BUN 18 03/21/2013 0535   CREATININE 1.13 03/21/2013 0535   CALCIUM 7.9* 03/21/2013 0535   PROT 5.3* 03/12/2013 0135   ALBUMIN 2.2* 03/12/2013 0135   AST 22 03/12/2013 0135   ALT 8 03/12/2013 0135   ALKPHOS 60 03/12/2013 0135   BILITOT 0.4 03/12/2013 0135   GFRNONAA 53* 03/21/2013 0535   GFRAA 61* 03/21/2013 0535    Assessment and Plan  PNA (pneumonia) LLL inflitrate per stat CXR ; augmentin    Hennie Duos, MD

## 2014-06-11 NOTE — Assessment & Plan Note (Addendum)
Probably aspiration LLL inflitrate per stat CXR ; augmentin 875 mg BID for 7 days ; nebs prn

## 2014-06-11 NOTE — Telephone Encounter (Signed)
[facility] 

## 2014-06-18 ENCOUNTER — Other Ambulatory Visit: Payer: Self-pay | Admitting: *Deleted

## 2014-06-18 MED ORDER — HYDROCODONE-ACETAMINOPHEN 5-325 MG PO TABS: ORAL_TABLET | ORAL | Status: AC

## 2014-06-18 NOTE — Telephone Encounter (Signed)
[facility] 

## 2014-07-02 ENCOUNTER — Ambulatory Visit (INDEPENDENT_AMBULATORY_CARE_PROVIDER_SITE_OTHER): Admitting: Internal Medicine

## 2014-07-02 DIAGNOSIS — Z710 Person encountering health services to consult on behalf of another person: Secondary | ICD-10-CM

## 2014-07-02 NOTE — Patient Instructions (Signed)
Dr. Caryl Comes will call family when he returns to office after 07/15/14.

## 2014-07-02 NOTE — Progress Notes (Signed)
Mr. 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 His device reached ERI June 2015.  We elected at that time not to do anything. We will check his device and see if it is functioning at all. Recorded heart rates through his primary care physician's appointment primarily in the 29s and 69s. No clear untoward events of the recognized.

## 2014-07-28 DEATH — deceased
